# Patient Record
Sex: Male | Born: 1950
Health system: Southern US, Community
[De-identification: ages and names within clinical notes are randomized; demographics above are authoritative.]

## PROBLEM LIST (undated history)

## (undated) ENCOUNTER — Ambulatory Visit (INDEPENDENT_AMBULATORY_CARE_PROVIDER_SITE_OTHER): Admission: EM | Payer: 59

## (undated) DIAGNOSIS — Z87828 Personal history of other (healed) physical injury and trauma: Secondary | ICD-10-CM

## (undated) DIAGNOSIS — F988 Other specified behavioral and emotional disorders with onset usually occurring in childhood and adolescence: Secondary | ICD-10-CM

## (undated) DIAGNOSIS — K219 Gastro-esophageal reflux disease without esophagitis: Secondary | ICD-10-CM

## (undated) DIAGNOSIS — F419 Anxiety disorder, unspecified: Secondary | ICD-10-CM

## (undated) DIAGNOSIS — F329 Major depressive disorder, single episode, unspecified: Secondary | ICD-10-CM

## (undated) DIAGNOSIS — E785 Hyperlipidemia, unspecified: Secondary | ICD-10-CM

## (undated) DIAGNOSIS — T7840XA Allergy, unspecified, initial encounter: Secondary | ICD-10-CM

## (undated) DIAGNOSIS — F32A Depression, unspecified: Secondary | ICD-10-CM

## (undated) DIAGNOSIS — M7071 Other bursitis of hip, right hip: Secondary | ICD-10-CM

## (undated) HISTORY — PX: COLONOSCOPY: SHX174

## (undated) HISTORY — DX: Allergy, unspecified, initial encounter: T78.40XA

## (undated) HISTORY — DX: Other specified behavioral and emotional disorders with onset usually occurring in childhood and adolescence: F98.8

## (undated) HISTORY — DX: Other bursitis of hip, right hip: M70.71

## (undated) HISTORY — DX: Anxiety disorder, unspecified: F41.9

## (undated) HISTORY — DX: Gastro-esophageal reflux disease without esophagitis: K21.9

## (undated) HISTORY — DX: Personal history of other (healed) physical injury and trauma: Z87.828

## (undated) HISTORY — DX: Hyperlipidemia, unspecified: E78.5

## (undated) HISTORY — DX: Major depressive disorder, single episode, unspecified: F32.9

## (undated) HISTORY — DX: Depression, unspecified: F32.A

---

## 2002-10-06 ENCOUNTER — Encounter: Payer: Self-pay | Admitting: Orthopedic Surgery

## 2002-10-06 ENCOUNTER — Encounter: Admission: RE | Admit: 2002-10-06 | Discharge: 2002-10-06 | Payer: Self-pay | Admitting: Orthopedic Surgery

## 2003-03-11 ENCOUNTER — Encounter: Admission: RE | Admit: 2003-03-11 | Discharge: 2003-03-11 | Payer: Self-pay | Admitting: Sports Medicine

## 2004-06-22 ENCOUNTER — Ambulatory Visit: Payer: Self-pay | Admitting: Family Medicine

## 2004-08-02 ENCOUNTER — Ambulatory Visit: Payer: Self-pay | Admitting: Family Medicine

## 2004-08-17 ENCOUNTER — Ambulatory Visit: Payer: Self-pay | Admitting: Family Medicine

## 2004-10-05 ENCOUNTER — Ambulatory Visit: Payer: Self-pay | Admitting: Family Medicine

## 2004-10-19 ENCOUNTER — Ambulatory Visit: Payer: Self-pay | Admitting: Family Medicine

## 2004-10-19 ENCOUNTER — Encounter: Admission: RE | Admit: 2004-10-19 | Discharge: 2005-01-17 | Payer: Self-pay | Admitting: Family Medicine

## 2005-04-08 ENCOUNTER — Ambulatory Visit: Payer: Self-pay | Admitting: Family Medicine

## 2006-03-11 ENCOUNTER — Ambulatory Visit: Payer: Self-pay | Admitting: Family Medicine

## 2006-03-11 LAB — CONVERTED CEMR LAB: PSA: 1.05 ng/mL

## 2006-03-27 ENCOUNTER — Ambulatory Visit: Payer: Self-pay | Admitting: Family Medicine

## 2006-03-27 LAB — FECAL OCCULT BLOOD, GUAIAC: Fecal Occult Blood: NEGATIVE

## 2006-06-24 ENCOUNTER — Ambulatory Visit: Payer: Self-pay | Admitting: Family Medicine

## 2006-09-18 ENCOUNTER — Ambulatory Visit: Payer: Self-pay | Admitting: Family Medicine

## 2006-09-18 LAB — CONVERTED CEMR LAB
ALT: 30 units/L (ref 0–40)
AST: 28 units/L (ref 0–37)
Albumin: 4.1 g/dL (ref 3.5–5.2)
BUN: 12 mg/dL (ref 6–23)
CO2: 31 meq/L (ref 19–32)
Calcium: 9.1 mg/dL (ref 8.4–10.5)
Chloride: 101 meq/L (ref 96–112)
Cholesterol: 219 mg/dL (ref 0–200)
Creatinine, Ser: 0.9 mg/dL (ref 0.4–1.5)
Direct LDL: 139.6 mg/dL
GFR calc Af Amer: 111 mL/min
GFR calc non Af Amer: 92 mL/min
Glucose, Bld: 98 mg/dL (ref 70–99)
HDL: 48.5 mg/dL (ref >39.0)
Phosphorus: 3.5 mg/dL (ref 2.3–4.6)
Potassium: 4.3 meq/L (ref 3.5–5.1)
Sodium: 136 meq/L (ref 135–145)
Total CHOL/HDL Ratio: 4.5
Triglycerides: 138 mg/dL (ref 0–149)
VLDL: 28 mg/dL (ref 0–40)

## 2006-11-28 ENCOUNTER — Ambulatory Visit: Payer: Self-pay | Admitting: Psychology

## 2006-12-22 ENCOUNTER — Ambulatory Visit: Payer: Self-pay | Admitting: Psychology

## 2007-01-06 ENCOUNTER — Ambulatory Visit: Payer: Self-pay | Admitting: Psychology

## 2007-01-07 ENCOUNTER — Encounter: Payer: Self-pay | Admitting: Family Medicine

## 2007-01-07 DIAGNOSIS — E785 Hyperlipidemia, unspecified: Secondary | ICD-10-CM | POA: Insufficient documentation

## 2007-01-07 DIAGNOSIS — F329 Major depressive disorder, single episode, unspecified: Secondary | ICD-10-CM

## 2007-01-07 DIAGNOSIS — J309 Allergic rhinitis, unspecified: Secondary | ICD-10-CM | POA: Insufficient documentation

## 2007-01-07 DIAGNOSIS — F418 Other specified anxiety disorders: Secondary | ICD-10-CM | POA: Insufficient documentation

## 2007-01-07 DIAGNOSIS — F528 Other sexual dysfunction not due to a substance or known physiological condition: Secondary | ICD-10-CM | POA: Insufficient documentation

## 2007-01-08 ENCOUNTER — Encounter: Payer: Self-pay | Admitting: Family Medicine

## 2007-01-08 ENCOUNTER — Ambulatory Visit: Payer: Self-pay | Admitting: Family Medicine

## 2007-01-08 DIAGNOSIS — F988 Other specified behavioral and emotional disorders with onset usually occurring in childhood and adolescence: Secondary | ICD-10-CM | POA: Insufficient documentation

## 2007-01-08 DIAGNOSIS — F411 Generalized anxiety disorder: Secondary | ICD-10-CM | POA: Insufficient documentation

## 2007-01-27 ENCOUNTER — Ambulatory Visit: Payer: Self-pay | Admitting: Psychology

## 2007-03-10 ENCOUNTER — Ambulatory Visit: Payer: Self-pay | Admitting: Psychology

## 2007-03-16 ENCOUNTER — Ambulatory Visit: Payer: Self-pay | Admitting: Family Medicine

## 2007-03-17 LAB — CONVERTED CEMR LAB
ALT: 32 units/L (ref 0–53)
AST: 28 units/L (ref 0–37)
Albumin: 4.1 g/dL (ref 3.5–5.2)
Alkaline Phosphatase: 49 units/L (ref 39–117)
BUN: 12 mg/dL (ref 6–23)
Basophils Absolute: 0 10*3/uL (ref 0.0–0.1)
Basophils Relative: 0.1 % (ref 0.0–1.0)
Bilirubin, Direct: 0.1 mg/dL (ref 0.0–0.3)
CO2: 29 meq/L (ref 19–32)
Calcium: 9 mg/dL (ref 8.4–10.5)
Chloride: 104 meq/L (ref 96–112)
Cholesterol: 203 mg/dL (ref 0–200)
Creatinine, Ser: 1 mg/dL (ref 0.4–1.5)
Direct LDL: 141.4 mg/dL
Eosinophils Absolute: 0 10*3/uL (ref 0.0–0.6)
Eosinophils Relative: 0.7 % (ref 0.0–5.0)
GFR calc Af Amer: 99 mL/min
GFR calc non Af Amer: 82 mL/min
Glucose, Bld: 112 mg/dL — ABNORMAL HIGH (ref 70–99)
HCT: 43.4 % (ref 39.0–52.0)
HDL: 51.3 mg/dL (ref >39.0)
Hemoglobin: 14.9 g/dL (ref 13.0–17.0)
Lymphocytes Relative: 28 % (ref 12.0–46.0)
MCHC: 34.4 g/dL (ref 30.0–36.0)
MCV: 91.8 fL (ref 78.0–100.0)
Monocytes Absolute: 0.4 10*3/uL (ref 0.2–0.7)
Monocytes Relative: 10.2 % (ref 3.0–11.0)
Neutro Abs: 2.1 10*3/uL (ref 1.4–7.7)
Neutrophils Relative %: 61 % (ref 43.0–77.0)
PSA: 1.11 ng/mL (ref 0.10–4.00)
Platelets: 204 10*3/uL (ref 150–400)
Potassium: 4.2 meq/L (ref 3.5–5.1)
RBC: 4.73 M/uL (ref 4.22–5.81)
RDW: 12.7 % (ref 11.5–14.6)
Sodium: 140 meq/L (ref 135–145)
TSH: 1.85 microintl units/mL (ref 0.35–5.50)
Total Bilirubin: 1.2 mg/dL (ref 0.3–1.2)
Total CHOL/HDL Ratio: 4
Total Protein: 6.9 g/dL (ref 6.0–8.3)
Triglycerides: 97 mg/dL (ref 0–149)
VLDL: 19 mg/dL (ref 0–40)
WBC: 3.5 10*3/uL — ABNORMAL LOW (ref 4.5–10.5)

## 2007-03-24 ENCOUNTER — Ambulatory Visit: Payer: Self-pay | Admitting: Psychology

## 2007-05-20 ENCOUNTER — Ambulatory Visit: Payer: Self-pay | Admitting: Family Medicine

## 2007-05-20 ENCOUNTER — Telehealth: Payer: Self-pay | Admitting: Family Medicine

## 2007-05-21 LAB — CONVERTED CEMR LAB
ALT: 46 units/L (ref 0–53)
AST: 38 units/L — ABNORMAL HIGH (ref 0–37)
Cholesterol: 186 mg/dL (ref 0–200)
Glucose, Bld: 105 mg/dL — ABNORMAL HIGH (ref 70–99)
HDL: 46.8 mg/dL (ref >39.0)
Hgb A1c MFr Bld: 6 % (ref 4.6–6.0)
LDL Cholesterol: 123 mg/dL — ABNORMAL HIGH (ref 0–99)
Total CHOL/HDL Ratio: 4
Triglycerides: 82 mg/dL (ref 0–149)
VLDL: 16 mg/dL (ref 0–40)

## 2007-05-29 ENCOUNTER — Encounter: Payer: Self-pay | Admitting: Family Medicine

## 2007-07-10 ENCOUNTER — Telehealth (INDEPENDENT_AMBULATORY_CARE_PROVIDER_SITE_OTHER): Payer: Self-pay | Admitting: *Deleted

## 2007-08-13 ENCOUNTER — Telehealth: Payer: Self-pay | Admitting: Family Medicine

## 2007-09-15 ENCOUNTER — Ambulatory Visit: Payer: Self-pay | Admitting: Cardiology

## 2007-10-08 ENCOUNTER — Telehealth: Payer: Self-pay | Admitting: Family Medicine

## 2007-10-26 ENCOUNTER — Ambulatory Visit: Payer: Self-pay | Admitting: Cardiology

## 2007-10-26 LAB — CONVERTED CEMR LAB
ALT: 47 units/L (ref 0–53)
AST: 32 units/L (ref 0–37)
Albumin: 4.1 g/dL (ref 3.5–5.2)
Alkaline Phosphatase: 49 units/L (ref 39–117)
Bilirubin, Direct: 0.1 mg/dL (ref 0.0–0.3)
Cholesterol: 163 mg/dL (ref 0–200)
HDL: 50.3 mg/dL (ref >39.0)
LDL Cholesterol: 92 mg/dL (ref 0–99)
Total Bilirubin: 1.1 mg/dL (ref 0.3–1.2)
Total CHOL/HDL Ratio: 3.2
Total Protein: 6.9 g/dL (ref 6.0–8.3)
Triglycerides: 103 mg/dL (ref 0–149)
VLDL: 21 mg/dL (ref 0–40)

## 2007-11-16 ENCOUNTER — Telehealth: Payer: Self-pay | Admitting: Family Medicine

## 2007-12-31 ENCOUNTER — Telehealth: Payer: Self-pay | Admitting: Family Medicine

## 2008-03-14 ENCOUNTER — Telehealth (INDEPENDENT_AMBULATORY_CARE_PROVIDER_SITE_OTHER): Payer: Self-pay | Admitting: *Deleted

## 2008-04-19 ENCOUNTER — Ambulatory Visit: Payer: Self-pay | Admitting: Family Medicine

## 2008-05-13 ENCOUNTER — Telehealth: Payer: Self-pay | Admitting: Family Medicine

## 2008-06-10 ENCOUNTER — Ambulatory Visit: Payer: Self-pay | Admitting: Family Medicine

## 2008-07-12 ENCOUNTER — Telehealth: Payer: Self-pay | Admitting: Family Medicine

## 2008-07-21 ENCOUNTER — Ambulatory Visit: Payer: Self-pay | Admitting: Family Medicine

## 2008-07-21 ENCOUNTER — Telehealth: Payer: Self-pay | Admitting: Family Medicine

## 2008-08-06 ENCOUNTER — Telehealth (INDEPENDENT_AMBULATORY_CARE_PROVIDER_SITE_OTHER): Payer: Self-pay | Admitting: *Deleted

## 2008-08-19 ENCOUNTER — Ambulatory Visit: Payer: Self-pay | Admitting: Family Medicine

## 2008-10-25 ENCOUNTER — Ambulatory Visit: Payer: Self-pay | Admitting: Family Medicine

## 2008-10-27 LAB — CONVERTED CEMR LAB
ALT: 39 units/L (ref 0–53)
AST: 32 units/L (ref 0–37)
Cholesterol: 213 mg/dL — ABNORMAL HIGH (ref 0–200)
Direct LDL: 130.9 mg/dL
HDL: 46.8 mg/dL (ref >39.00)
Total CHOL/HDL Ratio: 5
Triglycerides: 143 mg/dL (ref 0.0–149.0)
VLDL: 28.6 mg/dL (ref 0.0–40.0)

## 2008-11-02 ENCOUNTER — Telehealth: Payer: Self-pay | Admitting: Family Medicine

## 2008-11-29 ENCOUNTER — Ambulatory Visit: Payer: Self-pay | Admitting: Family Medicine

## 2008-11-30 LAB — CONVERTED CEMR LAB
ALT: 42 units/L (ref 0–53)
AST: 38 units/L — ABNORMAL HIGH (ref 0–37)
Cholesterol: 186 mg/dL (ref 0–200)
HDL: 47.3 mg/dL (ref >39.00)
LDL Cholesterol: 121 mg/dL — ABNORMAL HIGH (ref 0–99)
Total CHOL/HDL Ratio: 4
Triglycerides: 87 mg/dL (ref 0.0–149.0)
VLDL: 17.4 mg/dL (ref 0.0–40.0)

## 2009-01-24 ENCOUNTER — Telehealth: Payer: Self-pay | Admitting: Family Medicine

## 2009-01-31 ENCOUNTER — Ambulatory Visit: Payer: Self-pay | Admitting: Family Medicine

## 2009-04-17 ENCOUNTER — Telehealth: Payer: Self-pay | Admitting: Family Medicine

## 2009-05-04 ENCOUNTER — Ambulatory Visit: Payer: Self-pay | Admitting: Family Medicine

## 2009-05-04 DIAGNOSIS — E7800 Pure hypercholesterolemia, unspecified: Secondary | ICD-10-CM | POA: Insufficient documentation

## 2009-05-08 ENCOUNTER — Telehealth: Payer: Self-pay | Admitting: Family Medicine

## 2009-05-08 LAB — CONVERTED CEMR LAB
ALT: 37 units/L (ref 0–53)
AST: 32 units/L (ref 0–37)
Cholesterol: 185 mg/dL (ref 0–200)
HDL: 51.9 mg/dL (ref >39.00)
LDL Cholesterol: 115 mg/dL — ABNORMAL HIGH (ref 0–99)
Total CHOL/HDL Ratio: 4
Triglycerides: 93 mg/dL (ref 0.0–149.0)
VLDL: 18.6 mg/dL (ref 0.0–40.0)

## 2009-07-18 ENCOUNTER — Telehealth: Payer: Self-pay | Admitting: Family Medicine

## 2009-09-04 ENCOUNTER — Telehealth: Payer: Self-pay | Admitting: Family Medicine

## 2009-09-20 ENCOUNTER — Ambulatory Visit: Payer: Self-pay | Admitting: Family Medicine

## 2009-09-20 DIAGNOSIS — K219 Gastro-esophageal reflux disease without esophagitis: Secondary | ICD-10-CM | POA: Insufficient documentation

## 2009-10-12 ENCOUNTER — Telehealth: Payer: Self-pay | Admitting: Family Medicine

## 2009-10-18 ENCOUNTER — Ambulatory Visit: Payer: Self-pay | Admitting: Family Medicine

## 2009-11-01 ENCOUNTER — Telehealth: Payer: Self-pay | Admitting: Family Medicine

## 2009-11-01 ENCOUNTER — Encounter: Payer: Self-pay | Admitting: Family Medicine

## 2009-11-08 ENCOUNTER — Ambulatory Visit: Payer: Self-pay | Admitting: Family Medicine

## 2009-11-08 LAB — CONVERTED CEMR LAB
ALT: 38 units/L (ref 0–53)
AST: 31 units/L (ref 0–37)
Albumin: 4.1 g/dL (ref 3.5–5.2)
Alkaline Phosphatase: 50 units/L (ref 39–117)
BUN: 11 mg/dL (ref 6–23)
Basophils Absolute: 0 10*3/uL (ref 0.0–0.1)
Basophils Relative: 0.6 % (ref 0.0–3.0)
Bilirubin, Direct: 0.1 mg/dL (ref 0.0–0.3)
CO2: 29 meq/L (ref 19–32)
Calcium: 8.8 mg/dL (ref 8.4–10.5)
Chloride: 106 meq/L (ref 96–112)
Cholesterol: 129 mg/dL (ref 0–200)
Creatinine, Ser: 0.9 mg/dL (ref 0.4–1.5)
Eosinophils Absolute: 0 10*3/uL (ref 0.0–0.7)
Eosinophils Relative: 1.4 % (ref 0.0–5.0)
GFR calc non Af Amer: 91.1 mL/min (ref >60)
Glucose, Bld: 99 mg/dL (ref 70–99)
HCT: 41.6 % (ref 39.0–52.0)
HDL: 50.4 mg/dL (ref >39.00)
Hemoglobin: 14.4 g/dL (ref 13.0–17.0)
LDL Cholesterol: 63 mg/dL (ref 0–99)
Lymphocytes Relative: 32.2 % (ref 12.0–46.0)
Lymphs Abs: 1.2 10*3/uL (ref 0.7–4.0)
MCHC: 34.7 g/dL (ref 30.0–36.0)
MCV: 90.6 fL (ref 78.0–100.0)
Monocytes Absolute: 0.3 10*3/uL (ref 0.1–1.0)
Monocytes Relative: 8.4 % (ref 3.0–12.0)
Neutro Abs: 2.1 10*3/uL (ref 1.4–7.7)
Neutrophils Relative %: 57.4 % (ref 43.0–77.0)
PSA: 1.79 ng/mL (ref 0.10–4.00)
Platelets: 185 10*3/uL (ref 150.0–400.0)
Potassium: 4 meq/L (ref 3.5–5.1)
RBC: 4.59 M/uL (ref 4.22–5.81)
RDW: 13.6 % (ref 11.5–14.6)
Sodium: 141 meq/L (ref 135–145)
TSH: 1.67 microintl units/mL (ref 0.35–5.50)
Total Bilirubin: 0.7 mg/dL (ref 0.3–1.2)
Total CHOL/HDL Ratio: 3
Total Protein: 6.5 g/dL (ref 6.0–8.3)
Triglycerides: 79 mg/dL (ref 0.0–149.0)
VLDL: 15.8 mg/dL (ref 0.0–40.0)
WBC: 3.6 10*3/uL — ABNORMAL LOW (ref 4.5–10.5)

## 2009-11-13 ENCOUNTER — Ambulatory Visit: Payer: Self-pay | Admitting: Family Medicine

## 2009-12-25 ENCOUNTER — Ambulatory Visit: Payer: Self-pay | Admitting: Family Medicine

## 2010-01-02 ENCOUNTER — Telehealth: Payer: Self-pay | Admitting: Family Medicine

## 2010-01-10 ENCOUNTER — Telehealth: Payer: Self-pay | Admitting: Family Medicine

## 2010-04-19 ENCOUNTER — Telehealth: Payer: Self-pay | Admitting: Family Medicine

## 2010-06-18 ENCOUNTER — Telehealth: Payer: Self-pay | Admitting: Family Medicine

## 2010-06-29 ENCOUNTER — Telehealth: Payer: Self-pay | Admitting: Family Medicine

## 2010-07-12 ENCOUNTER — Ambulatory Visit: Payer: Self-pay | Admitting: Family Medicine

## 2010-08-21 NOTE — Progress Notes (Signed)
Summary: pt is asking if ok to take glucosamine  Phone Note Call from Patient Call back at Work Phone [phone]   Caller: Patient Call For: Judith Part MD Summary of Call: Pt is asking if ok for him to take glucosamine chondroitin for his joints. Initial call taken by: Lowella Petties CMA,  November 01, 2009 9:09 AM  Follow-up for Phone Call        that is ok if he wants to try it  if not improved in 1-2 months - do not spend any more money on it-- will know by then if it helps this med also raises cholesterol- so will watch that  please add to med list  Follow-up by: Judith Part MD,  November 01, 2009 9:29 AM  Additional Follow-up for Phone Call Additional follow up Details #1::        Patient notified as instructed by telephone. Added to med list.Rena Isley LPN  November 01, 2009 1:05 PM

## 2010-08-21 NOTE — Progress Notes (Signed)
Summary: Refill Ritalin   Phone Note Call from Patient Call back at [phone]   Caller: Patient Call For: Dr. Milinda Antis Summary of Call: Pt needs rx for his Ritalin. Call wife when ready at [phone] Initial call taken by: Sydell Axon,  October 08, 2007 11:51 AM  Follow-up for Phone Call        printed and in my out box for pick up  Follow-up by: Judith Part MD,  October 08, 2007 12:08 PM  Additional Follow-up for Phone Call Additional follow up Details #1::        [facility] advising pt ok to pick up Additional Follow-up by: Lowella Petties,  October 08, 2007 12:33 PM      Prescriptions: RITALIN SR 20 MG  TBCR (METHYLPHENIDATE HCL) take one by mouth daily  #30 x 0   Entered and Authorized by:   Judith Part MD   Signed by:   Judith Part MD on 10/08/2007   Method used:   Print then Give to Patient   RxID:   [id number]

## 2010-08-21 NOTE — Progress Notes (Signed)
Summary: needs refill on ritalin  Phone Note Call from Patient Call back at Work Phone [phone]   Caller: mPatient Call For: Judith Part MD Summary of Call: Pt needs refill on ritalin, please call when ready. Initial call taken by: Lowella Petties,  July 12, 2008 2:37 PM  Follow-up for Phone Call        printed and in my out box for pick up  Follow-up by: Judith Part MD,  July 12, 2008 3:45 PM  Additional Follow-up for Phone Call Additional follow up Details #1::        Advised patient.  ......................................................Marland KitchenLiane Comber July 12, 2008 4:08 PM Rx left at front desk for patient to pick up. .............................................................Marland KitchenNatasha Chavers July 12, 2008 4:08 PM       Prescriptions: RITALIN SR 20 MG  TBCR (METHYLPHENIDATE HCL) take one by mouth daily  #30 x 0   Entered and Authorized by:   Judith Part MD   Signed by:   Judith Part MD on 07/12/2008   Method used:   Print then Give to Patient   RxID:   [id number]

## 2010-08-21 NOTE — Progress Notes (Signed)
Summary: omeprazole is not helping  Phone Note Call from Patient Call back at Work Phone [phone]   Caller: Patient Call For: Judith Part MD Summary of Call: Pt has been taking omeprazole but he thinks this is too weak.  He would like to change to something else.  Co- workers use protonix.  [facility].   Initial call taken by: Lowella Petties CMA, AAMA,  June 18, 2010 12:35 PM  Follow-up for Phone Call        let me know what symptoms he is having  can change to protonix -px written on EMR for call in  make sure he is having no caffiene f/u with me in 2-4 weeks -- call earlier though if worse or no imp Follow-up by: Judith Part MD,  June 18, 2010 1:26 PM  Additional Follow-up for Phone Call Additional follow up Details #1::        Pt has cut out his caffeine, his diet has been some different since before  thanksgiving.  He will work on this.  Medicine sent to pharmacy, pt will see how he does and wil call back to schedule appt. Additional Follow-up by: Lowella Petties CMA, AAMA,  June 18, 2010 4:32 PM   New Allergies: * OMEPRAZOLE New/Updated Medications: PROTONIX 40 MG TBEC (PANTOPRAZOLE SODIUM) 1 by mouth each am about 30 min before breakfast New Allergies: * OMEPRAZOLEPrescriptions: PROTONIX 40 MG TBEC (PANTOPRAZOLE SODIUM) 1 by mouth each am about 30 min before breakfast  #30 x 11   Entered by:   Lowella Petties CMA, AAMA   Authorized by:   Judith Part MD   Signed by:   Lowella Petties CMA, AAMA on 06/18/2010   Method used:   Electronically to        [facility]  [phone]* (retail)       [address] [place], [place]  [address]       Ph: [phone] or [phone]       Fax: [phone]   RxID:   [id number]

## 2010-08-21 NOTE — Progress Notes (Signed)
Summary: [facility]  Prescriptions: CRESTOR 40 MG TABS (ROSUVASTATIN CALCIUM) one by mouth daily  #90 x 3   Entered by:   Lowella Petties CMA   Authorized by:   Judith Part MD   Signed by:   Lowella Petties CMA on 09/04/2009   Method used:   Electronically to        [facility]  [phone]* (retail)       [address] [place], [place]  [address]       Ph: [phone] or [phone]       Fax: [phone]   RxID:   [id number]

## 2010-08-21 NOTE — Progress Notes (Signed)
Summary: refill request for ritalin  Phone Note Refill Request Call back at Work Phone [phone] Message from:  Patient  Refills Requested: Medication #1:  RITALIN SR 20 MG  TBCR take one by mouth daily Please call when ready.  Initial call taken by: Lowella Petties CMA,  April 17, 2009 8:32 AM  Follow-up for Phone Call        printed in put in nurse in box for pickup  Follow-up by: Judith Part MD,  April 17, 2009 8:47 AM  Additional Follow-up for Phone Call Additional follow up Details #1::        Advised patient.  ......................................................Marland KitchenNatasha Chavers CMA Duncan Dull) April 17, 2009 9:26 AM Rx left at front desk for patient to pick up. .............................................................Marland KitchenNatasha Chavers CMA (AAMA) April 17, 2009 9:26 AM     Prescriptions: RITALIN SR 20 MG  TBCR (METHYLPHENIDATE HCL) take one by mouth daily  #30 x 0   Entered and Authorized by:   Judith Part MD   Signed by:   Judith Part MD on 04/17/2009   Method used:   Print then Give to Patient   RxID:   [id number]

## 2010-08-21 NOTE — Progress Notes (Signed)
Summary: Pt needs rx for his Ritalin  Phone Note Call from Patient Call back at Work Phone [phone]   Caller: Patient Summary of Call: Pt needs rx for his Ritalin. Please call when ready at work number. Initial call taken by: Mickle Asper,  November 16, 2007 3:07 PM  Follow-up for Phone Call        printed and in my out box for pick up  Follow-up by: Judith Part MD,  November 16, 2007 3:17 PM  Additional Follow-up for Phone Call Additional follow up Details #1::        [facility] advising pt Additional Follow-up by: Lowella Petties,  November 16, 2007 3:24 PM      Prescriptions: RITALIN SR 20 MG  TBCR (METHYLPHENIDATE HCL) take one by mouth daily  #30 x 0   Entered and Authorized by:   Judith Part MD   Signed by:   Judith Part MD on 11/16/2007   Method used:   Print then Give to Patient   RxID:   [id number]

## 2010-08-21 NOTE — Progress Notes (Signed)
Summary: needs refill on ritalin  Phone Note Call from Patient Call back at Work Phone [phone]   Caller: Patient Call For: dr Judye Lorino Summary of Call: Pt need refill on ritalin, needs next week.  also wants referral to derm, Dr Londell Moh, for dry patch on ear. Initial call taken by: Lowella Petties,  May 13, 2008 4:07 PM  Follow-up for Phone Call        printed and in my out box for pick up will [facility] ref to [facility] Follow-up by: Judith Part MD,  May 13, 2008 5:00 PM  Additional Follow-up for Phone Call Additional follow up Details #1::        Appt made w/Dermatologist. Message left on Pt's machine w/detailed instructions about appt. Additional Follow-up by: Mickle Asper,  May 16, 2008 5:01 PM  New Problems: OTH SYMPTOMS INVOLVING SKIN&INTEG TISSUES (ICD-782.9)   New Problems: OTH SYMPTOMS INVOLVING SKIN&INTEG TISSUES (ICD-782.9)   Prescriptions: RITALIN SR 20 MG  TBCR (METHYLPHENIDATE HCL) take one by mouth daily  #30 x 0   Entered and Authorized by:   Judith Part MD   Signed by:   Judith Part MD on 05/13/2008   Method used:   Print then Give to Patient   RxID:   [id number]

## 2010-08-21 NOTE — Progress Notes (Signed)
Summary: refill request for paxil  Phone Note Refill Request Call back at Home Phone [phone] Message from:  Patient  Refills Requested: Medication #1:  paxil Refill request from pt, this is no longer on med list.  [facility] to [facility].  Initial call taken by: Lowella Petties CMA,  May 08, 2009 8:51 AM  Follow-up for Phone Call        please ask him if he wants to go back to plain paxil 20 or paxil cr 12.5 -- if he remembers (both on med list prev) Follow-up by: Judith Part MD,  May 08, 2009 9:12 AM  Additional Follow-up for Phone Call Additional follow up Details #1::        Left message for patient to call back. Lewanda Rife LPN  May 08, 2009 5:23 PM   Pt does not remember which paxil he wants to go back on. He apologized and said he would ck with [facility] tomorrow.  ok- please let me know then  Additional Follow-up by: Lewanda Rife LPN,  May 09, 2009 4:24 PM    Additional Follow-up for Phone Call Additional follow up Details #2::    Spoke to pharmacist, Onalee Hua at [facility] and last med prescribed was in 2009 and was Paxil CR 12.5mg . Thank you.Lewanda Rife LPN  May 09, 2009 5:04 PM   will go with that then--px written on EMR for call in  Follow-up by: Judith Part MD,  May 09, 2009 5:06 PM  Additional Follow-up for Phone Call Additional follow up Details #3:: Details for Additional Follow-up Action Taken: Medication phoned in to [facility] as instructed. Pt will ck with pharmacy tomorrow and pick med up. Additional Follow-up by: Lewanda Rife LPN,  May 09, 2009 5:19 PM  New/Updated Medications: PAXIL CR 12.5 MG XR24H-TAB (PAROXETINE HCL) 1 by mouth once daily Prescriptions: PAXIL CR 12.5 MG XR24H-TAB (PAROXETINE HCL) 1 by mouth once daily  #30 x 5   Entered and Authorized by:   Judith Part MD   Signed by:   Judith Part MD on 05/09/2009   Method used:   Telephoned to ...         RxID:    [id number]

## 2010-08-21 NOTE — Assessment & Plan Note (Signed)
Summary: CPX/CLE  Medications Added PAXIL 20 MG  TABS (PAROXETINE HCL)  * VITAMIN D  * ASA 81 MG QD         Vital Signs:  Patient Profile:   60 Years Old Male Height:     66.75 inches Weight:      177 pounds Temp:     98.4 degrees F oral Pulse rate:   64 / minute Pulse rhythm:   regular BP sitting:   120 / 84  (left arm) Cuff size:   regular  Vitals Entered By: Lowella Petties (March 16, 2007 9:15 AM)               Chief Complaint:  Check up.  History of Present Illness: has been doing ok in general paxil made him a little less anxious-a little drowsy at first has helped him sleep in general- wakes up occasionally is seeing Dr Dellia Cloud- and doing ok will think about colonoscopy no prostate problems- nocturia occas-drinks lots of diet coke  has been ok with hip bursitis and meniscal prob in L knee has coffee at least 4 cups daily- makes him feel better ADD has been stable- ritalin really helps with mood (frustration) also  walks regularly for exercise- forgets to wear sunblock is better when playing golf is interested in derm visit for skin ca screen and to re check scab on nose   Current Allergies: ! ZOLOFT   Family History:    Father: CAD- deceased age 32    Mother: HTN, depression    Siblings:     P uncle with CAD    MGF with COPD    Review of Systems      See HPI  General      Denies chills, fatigue, fever, and loss of appetite.  Eyes      Denies blurring and eye irritation.  CV      Denies chest pain or discomfort, palpitations, shortness of breath with exertion, and swelling of feet.  Resp      Denies shortness of breath.  GI      Denies bloody stools, change in bowel habits, loss of appetite, nausea, and vomiting.  GU      Denies nocturia, urinary frequency, and urinary hesitancy.  Derm      Denies changes in color of skin, lesion(s), and rash.  Neuro      Denies numbness and tremors.  Psych      mood is some  improved  Endo      Denies excessive thirst and excessive urination.   Physical Exam  General:     Well-developed,well-nourished,in no acute distress; alert,appropriate and cooperative throughout examination Head:     Normocephalic and atraumatic without obvious abnormalities. No apparent alopecia or balding. Eyes:     vision grossly intact, pupils equal, pupils round, and pupils reactive to light.   Ears:     R ear normal and L ear normal.   Nose:     no nasal discharge.   Mouth:     pharynx pink and moist.   Neck:     No deformities, masses, or tenderness noted.supple, no masses, no thyromegaly, no JVD, and no carotid bruits.   Chest Wall:     No deformities, masses, tenderness or gynecomastia noted. Lungs:     Normal respiratory effort, chest expands symmetrically. Lungs are clear to auscultation, no crackles or wheezes. Heart:     Normal rate and regular rhythm. S1 and S2 normal without gallop,  murmur, click, rub or other extra sounds. Abdomen:     Bowel sounds positive,abdomen soft and non-tender without masses, organomegaly or hernias noted. Rectal:     No external abnormalities noted. Normal sphincter tone. No rectal masses or tenderness.  heme neg stool Prostate:     Prostate gland firm and smooth, no enlargement, nodularity, tenderness, mass, asymmetry or induration. (20-30 g) Msk:     No deformity or scoliosis noted of thoracic or lumbar spine.   Pulses:     R and L carotid,radial,femoral,dorsalis pedis and posterior tibial pulses are full and equal bilaterally Extremities:     No clubbing, cyanosis, edema, or deformity noted with normal full range of motion of all joints.   Neurologic:     sensation intact to light touch, gait normal, and DTRs symmetrical and normal.  no tremor Skin:     turgor normal, color normal, and no rashes.   AK noted on nasal bridge Cervical Nodes:     No lymphadenopathy noted Inguinal Nodes:     No significant adenopathy Psych:      pleasant, trouble with rapid speech today, as he did not take his ADD med    Impression & Recommendations:  Problem # 1:  HEALTH MAINTENANCE EXAM (ICD-V70.0) disc health habits like diet and exercise and skin ca prev reviewed health me list, given stool cards for colon ca screen (pt will call if he wants colonosc) fam hx reviewed Orders: Venipuncture (88416) TLB-BMP (Basic Metabolic Panel-BMET) (80048-METABOL) TLB-CBC Platelet - w/Differential (85025-CBCD) TLB-Hepatic/Liver Function Pnl (80076-HEPATIC) TLB-TSH (Thyroid Stimulating Hormone) (84443-TSH) TLB-PSA (Prostate Specific Antigen) (84153-PSA)   Problem # 2:  ACTINIC KERATOSIS (ICD-702.0) on bridge of nose- will f/u with his derm Orders: Venipuncture (60630) Dermatology Referral (Derma)   Problem # 3:  HYPERLIPIDEMIA (ICD-272.4) will check labs today, is eating better (cut out muffins) His updated medication list for this problem includes:    Lipitor 10 Mg Tabs (Atorvastatin calcium) .Marland Kitchen... Take one by mouth daily  Orders: Venipuncture (16010) TLB-Lipid Panel (80061-LIPID)   Complete Medication List: 1)  Lipitor 10 Mg Tabs (Atorvastatin calcium) .... Take one by mouth daily 2)  Ritalin Sr 20 Mg Tbcr (Methylphenidate hcl) .... Take one by mouth daily 3)  Paxil 20 Mg Tabs (Paroxetine hcl) 4)  Vitamin D  5)  Asa 81 Mg Qd    Patient Instructions: 1)  the name of skin lotion over the counter for dry skin is called amylactin 2)  we will refer you to dermatology at check out 3)  keep watching diet for cholesterol    Prescriptions: LIPITOR 10 MG  TABS (ATORVASTATIN CALCIUM) take one by mouth daily  #30 x 11   Entered and Authorized by:   Judith Part MD   Signed by:   Judith Part MD on 03/16/2007   Method used:   Print then Give to Patient   RxID:   [id number] RITALIN SR 20 MG  TBCR (METHYLPHENIDATE HCL) take one by mouth daily  #30 x 0   Entered and Authorized by:   Judith Part MD   Signed by:    Judith Part MD on 03/16/2007   Method used:   Print then Give to Patient   RxID:   [id number]   Prior Medications: LIPITOR 10 MG  TABS (ATORVASTATIN CALCIUM) take one by mouth daily RITALIN SR 20 MG  TBCR (METHYLPHENIDATE HCL) take one by mouth daily Current Allergies: ! ZOLOFT   Preventive Care Screening  DRE 8/07

## 2010-08-21 NOTE — Assessment & Plan Note (Signed)
Summary: FLU SHOT/CLE   Nurse Visit    Prior Medications: LIPITOR 10 MG  TABS (ATORVASTATIN CALCIUM) take one by mouth daily RITALIN SR 20 MG  TBCR (METHYLPHENIDATE HCL) take one by mouth daily PAXIL CR 12.5 MG  TB24 (PAROXETINE HCL) 1 by mouth q evening ALPRAZOLAM 0.5 MG  TABS (ALPRAZOLAM) 1 by mouth two times a day as needed for severe anxiety RITALIN SR 20 MG  TBCR (METHYLPHENIDATE HCL) 1 by mouth qd PAXIL 20 MG  TABS (PAROXETINE HCL)  VITAMIN D ()  ASA 81 MG QD ()  Current Allergies: ! ZOLOFT    Orders Added: 1)  Flu Vaccine 40yrs + [90658] 2)  Admin 1st Vaccine Mishka.Peer    ]  Influenza Vaccine    Vaccine Type: Fluvax 3+    Site: left deltoid    Mfr: Sanofi Pasteur    Dose: 0.5 ml    Route: IM    Given by: Providence Crosby    Exp. Date: 01/19/2008    Lot #: [id number]    VIS given: 01/18/05 version given May 29, 2007.  Flu Vaccine Consent Questions    Do you have a history of severe allergic reactions to this vaccine? no    Any prior history of allergic reactions to egg and/or gelatin? no    Do you have a sensitivity to the preservative Thimersol? no    Do you have a past history of Guillan-Barre Syndrome? no    Do you currently have an acute febrile illness? no    Have you ever had a severe reaction to latex? no    Vaccine information given and explained to patient? yes

## 2010-08-21 NOTE — Assessment & Plan Note (Signed)
Summary: OV     Current Allergies: ! ZOLOFT

## 2010-08-21 NOTE — Progress Notes (Signed)
Summary: RITALIN RX please.  Phone Note Call from Patient Call back at Work Phone [phone]   Caller: Patient Summary of Call: Pt called for RX for his RITALIN. Please calll him when it is ready for pick up. Initial call taken by: Mickle Asper,  December 31, 2007 11:46 AM  Follow-up for Phone Call        px written on EMR for call in  Follow-up by: Judith Part MD,  December 31, 2007 1:46 PM  Additional Follow-up for Phone Call Additional follow up Details #1::        advised pt ok to pick up Additional Follow-up by: Lowella Petties,  December 31, 2007 2:08 PM      Prescriptions: RITALIN SR 20 MG  TBCR (METHYLPHENIDATE HCL) take one by mouth daily  #30 x 0   Entered and Authorized by:   Judith Part MD   Signed by:   Judith Part MD on 12/31/2007   Method used:   Print then Give to Patient   RxID:   [id number]

## 2010-08-21 NOTE — Progress Notes (Signed)
Summary: ritalin rx   Phone Note Refill Request Call back at Work Phone [phone] Message from:  Patient on August 13, 2007 12:32 PM  Refills Requested: Medication #1:  RITALIN SR 20 MG  TBCR take one by mouth daily Initial call taken by: Liane Comber,  August 13, 2007 12:32 PM  Follow-up for Phone Call        printed and in my out box for pick up  Follow-up by: Judith Part MD,  August 13, 2007 12:45 PM  Additional Follow-up for Phone Call Additional follow up Details #1::        [facility] advising pt ok to pick up Additional Follow-up by: Lowella Petties,  August 13, 2007 3:02 PM      Prescriptions: RITALIN SR 20 MG  TBCR (METHYLPHENIDATE HCL) take one by mouth daily  #30 x 0   Entered and Authorized by:   Judith Part MD   Signed by:   Judith Part MD on 08/13/2007   Method used:   Print then Give to Patient   RxID:   [id number]

## 2010-08-21 NOTE — Progress Notes (Signed)
  Phone Note Outgoing Call   Call placed by: Mills Koller,  January 02, 2010 2:14 PM Call placed to: Patient Summary of Call: Stool ifob was canceled due to "no sample returned". I have made several attemps to contact pt, LMOM .  If he does return the call I will ask him to follow through on ifob testing.   Initial call taken by: Mills Koller,  January 02, 2010 2:24 PM

## 2010-08-21 NOTE — Miscellaneous (Signed)
Summary: Glucosamine Chondritin update  Medications Added GLUCOSAMINE-CHONDROITIN 500-400 MG CAPS (GLUCOSAMINE-CHONDROITIN) OTC As directed.       Clinical Lists Changes  Medications: Added new medication of GLUCOSAMINE-CHONDROITIN 500-400 MG CAPS (GLUCOSAMINE-CHONDROITIN) OTC As directed.

## 2010-08-21 NOTE — Progress Notes (Signed)
Summary: needs refill on ritalin  Phone Note Refill Request Call back at Work Phone [phone] Message from:  Patient  Refills Requested: Medication #1:  RITALIN SR 20 MG  TBCR take one by mouth daily Phoned request from pt, please call when ready.  Initial call taken by: Lowella Petties,  November 02, 2008 3:04 PM  Follow-up for Phone Call        printed and in my out box for pick up  Follow-up by: Judith Part MD,  November 02, 2008 4:51 PM  Additional Follow-up for Phone Call Additional follow up Details #1::        Advised patient.  ......................................................Marland KitchenNatasha Chavers November 03, 2008 9:08 AM Rx left at front desk for patient to pick up. .............................................................Marland KitchenNatasha Chavers November 03, 2008 9:08 AM       Prescriptions: RITALIN SR 20 MG  TBCR (METHYLPHENIDATE HCL) take one by mouth daily  #30 x 0   Entered and Authorized by:   Judith Part MD   Signed by:   Judith Part MD on 11/02/2008   Method used:   Print then Give to Patient   RxID:   [id number]

## 2010-08-21 NOTE — Assessment & Plan Note (Signed)
Summary: COUGH X 2 MTHS/CLE   Vital Signs:  Patient Profile:   60 Years Old Male Height:     66.75 inches Weight:      181 pounds BMI:     28.66 Temp:     97.5 degrees F oral Pulse rate:   60 / minute Pulse rhythm:   regular BP standing:   120 / 86  (right arm) Cuff size:   regular  Vitals Entered By: Liane Comber (August 19, 2008 8:51 AM)                 Chief Complaint:  Cough.  History of Present Illness: thinks he is doing better with cough and congestion was seen in nov and december for ? bronchitis / uri off and on - better and then worse  using tessalon 100 mg - works well for him had used tussionex   cough is slowly slowing down   no heartburn or stomach probs at all never had a fever  nose and throat and ears are fine  had coughed up dark yellow sputum last weekend-- and now non productive (and overall getting better )  needs refil of viagra   had ear lesion taken off - Dr Londell Moh - is doing well with that  does wear sun protection  needs refil on crestor chol has done very well on it has had cardiology f/u no chest pain or sob does watch sat fats in diet most or the time     Current Allergies: ! ZOLOFT  Past Medical History:    Reviewed history from 01/07/2007 and no changes required:       Allergic rhinitis       Depression/anx       ADD       Hyperlipidemia       AKs                     derm- [place]  Past Surgical History:    Reviewed history from 01/07/2007 and no changes required:       Left knee meniscal tear       C-7 old fracture   Family History:    Reviewed history from 03/16/2007 and no changes required:       Father: CAD- deceased age 43       Mother: HTN, depression       Siblings:        P uncle with CAD       MGF with COPD  Social History:    Reviewed history from 06/10/2008 and no changes required:       Marital Status: Married       Children: 1       Occupation: Psychologist, occupational       non smoker    Review of  Systems  General      Denies chills, fatigue, fever, loss of appetite, malaise, and weight loss.  Eyes      Denies blurring and itching.  ENT      Denies earache, hoarseness, and nasal congestion.  CV      Denies chest pain or discomfort, palpitations, and swelling of feet.  Resp      Complains of cough.      Denies pleuritic, shortness of breath, sputum productive, and wheezing.  GI      Denies abdominal pain, indigestion, loss of appetite, nausea, and vomiting.  Derm      Complains of lesion(s).  Psych  mood ok    Physical Exam  General:     Well-developed,well-nourished,in no acute distress; alert,appropriate and cooperative throughout examination Head:     normocephalic, atraumatic, and no abnormalities observed.  no sinus tenderness  Eyes:     vision grossly intact, pupils equal, pupils round, and pupils reactive to light.  no conjunctival pallor, injection or icterus  Ears:     R ear normal and L ear normal.   Nose:     nares are boggy but clear Mouth:     pharyngeal erythema, no exudate Neck:     No deformities, masses, or tenderness noted. Chest Wall:     No deformities, masses, tenderness or gynecomastia noted. Lungs:     Normal respiratory effort, chest expands symmetrically. Lungs are clear to auscultation, no crackles or wheezes. Heart:     Normal rate and regular rhythm. S1 and S2 normal without gallop, murmur, click, rub or other extra sounds. Abdomen:     Bowel sounds positive,abdomen soft and non-tender without masses, organomegaly or hernias noted. no renal bruits  Msk:     No deformity or scoliosis noted of thoracic or lumbar spine.   Pulses:     R and L carotid,radial,femoral,dorsalis pedis and posterior tibial pulses are full and equal bilaterally Extremities:     No clubbing, cyanosis, edema, or deformity noted with normal full range of motion of all joints.   Skin:     several AKS - face/forehead healing lesion on ear Cervical  Nodes:     No lymphadenopathy noted Psych:     as usual- slt hyperactive demeanor- but pleasant and talkative     Impression & Recommendations:  Problem # 1:  COUGH (ICD-786.2) Assessment: Improved s/p 2 separate uris- slowly resolving , much imp with tessalon will continue to watch for prod cough/sob/wheeze/fever- update also tx 1 week for gerd- with low dose zantac otc call if not sig imp next week   Problem # 2:  ADD (ICD-314.00) Assessment: Unchanged doing well- refil med today  Problem # 3:  ERECTILE DYSFUNCTION (ICD-302.72) Assessment: Unchanged refil viagra today clinically stable  His updated medication list for this problem includes:    Viagra 50 Mg Tabs (Sildenafil citrate) .Marland Kitchen... 1 by mouth as directed as needed before sexual activity   Problem # 4:  HYPERLIPIDEMIA (ICD-272.4) Assessment: Unchanged doing very well on crestor and diet refil med  rev sat fats in diet labs planned for april His updated medication list for this problem includes:    Crestor 40 Mg Tabs (Rosuvastatin calcium) ..... One by mouth daily  Labs Reviewed: Chol: 163 (10/26/2007)   HDL: 50.3 (10/26/2007)   LDL: 92 (10/26/2007)   TG: 103 (10/26/2007) SGOT: 32 (10/26/2007)   SGPT: 47 (10/26/2007)   Complete Medication List: 1)  Crestor 40 Mg Tabs (Rosuvastatin calcium) .... One by mouth daily 2)  Ritalin Sr 20 Mg Tbcr (Methylphenidate hcl) .... Take one by mouth daily 3)  Benzonatate 100 Mg Caps (Benzonatate) .Marland Kitchen.. 1-2 caps every 8 hours as needed cough.  swallow whole, do not chew or dissolve in mouth 4)  Viagra 50 Mg Tabs (Sildenafil citrate) .Marland Kitchen.. 1 by mouth as directed as needed before sexual activity   Patient Instructions: 1)  continue the tessalon for cough as needed  2)  also take zantac over the counter 75 two times a day for one week to help any reflux that may be triggering cough  3)  update me if not improved further next week  4)  schedule labs fasting for lipid/ast/alt 272 in  april    Prescriptions: CRESTOR 40 MG TABS (ROSUVASTATIN CALCIUM) one by mouth daily  #90 x 3   Entered and Authorized by:   Judith Part MD   Signed by:   Judith Part MD on 08/19/2008   Method used:   Print then Give to Patient   RxID:   [id number] VIAGRA 50 MG TABS (SILDENAFIL CITRATE) 1 by mouth as directed as needed before sexual activity  #9 x 1   Entered and Authorized by:   Judith Part MD   Signed by:   Judith Part MD on 08/19/2008   Method used:   Print then Give to Patient   RxID:   [id number] RITALIN SR 20 MG  TBCR (METHYLPHENIDATE HCL) take one by mouth daily  #30 x 0   Entered and Authorized by:   Judith Part MD   Signed by:   Judith Part MD on 08/19/2008   Method used:   Print then Give to Patient   RxID:   [id number] BENZONATATE 100 MG CAPS (BENZONATATE) 1-2 caps every 8 hours as needed cough.  swallow whole, do not chew or dissolve in mouth  #30 x 0   Entered and Authorized by:   Judith Part MD   Signed by:   Judith Part MD on 08/19/2008   Method used:   Print then Give to Patient   RxID:   [id number]

## 2010-08-21 NOTE — Assessment & Plan Note (Signed)
Summary: COLD/RBH   Vital Signs:  Patient Profile:   60 Years Old Male Height:     66.75 inches Weight:      182 pounds BMI:     28.82 Temp:     97.9 degrees F oral Pulse rate:   68 / minute Pulse rhythm:   regular BP sitting:   118 / 80  (left arm) Cuff size:   regular  Vitals Entered By: Liane Comber (June 10, 2008 12:55 PM)                 Chief Complaint:  Cold & URI symptoms.  History of Present Illness: thinks he has uri  miserable all week with cough and nasal congestion got run down - with work throat- lots of congestion  monday- scratchy and then really sore on tuesday clammy and sometimes hot - but no fever  no facial pain  is clearing throat and hoarse (was worse 2 days ago) some color in phlegm  was coughing bad before tussin   no n/v/d    has taken some tussin over the counter  did take some zyrtec     Current Allergies (reviewed today): ! ZOLOFT  Past Medical History:    Reviewed history from 01/07/2007 and no changes required:       Allergic rhinitis       Depression       Hyperlipidemia  Past Surgical History:    Reviewed history from 01/07/2007 and no changes required:       Left knee meniscal tear       C-7 old fracture   Family History:    Reviewed history from 03/16/2007 and no changes required:       Father: CAD- deceased age 60       Mother: HTN, depression       Siblings:        P uncle with CAD       MGF with COPD  Social History:    Reviewed history from 01/07/2007 and no changes required:       Marital Status: Married       Children: 1       Occupation: Psychologist, occupational       non smoker    Review of Systems  General      Complains of fatigue.      Denies fever, loss of appetite, and malaise.  Eyes      Denies blurring, discharge, eye pain, and red eye.  ENT      Complains of nasal congestion and postnasal drainage.      Denies ear discharge, earache, and sinus pressure.  CV      Denies chest pain or  discomfort and palpitations.  Resp      Complains of cough and sputum productive.      Denies shortness of breath and wheezing.  GI      Denies diarrhea, nausea, and vomiting.  Derm      Denies rash.   Physical Exam  General:     Well-developed,well-nourished,in no acute distress; alert,appropriate and cooperative throughout examination Head:     normocephalic, atraumatic, and no abnormalities observed.  no sinus tenderness  Eyes:     vision grossly intact, pupils equal, pupils round, and pupils reactive to light.  no conjunctival pallor, injection or icterus  Ears:     R ear normal and L ear normal.   Nose:     nares are boggy and injected  Mouth:  pharynx pink and moist, no erythema, and no exudates.   Neck:     No deformities, masses, or tenderness noted. Lungs:     harsh bs at bases  no rales or rhonchi  scant wheeze on forced exp only   Heart:     Normal rate and regular rhythm. S1 and S2 normal without gallop, murmur, click, rub or other extra sounds. Skin:     Intact without suspicious lesions or rashes Cervical Nodes:     No lymphadenopathy noted Psych:     normal affect, talkative and pleasant     Impression & Recommendations:  Problem # 1:  URI (ICD-465.9) Assessment: New viral uri with cough and congestion  adv symt care / fluids /rest  will try otc tussin for cough-- if more severe cough- give tussionex to use with caution update if worse or fever/ not imp 1 week His updated medication list for this problem includes:    Tussionex Pennkinetic Er 8-10 Mg/11ml Lqcr (Chlorpheniramine-hydrocodone) .Marland Kitchen... 1/2 to 1 tsp up to two times a day as needed severe cough   Complete Medication List: 1)  Crestor 40 Mg Tabs (Rosuvastatin calcium) .... One by mouth daily 2)  Ritalin Sr 20 Mg Tbcr (Methylphenidate hcl) .... Take one by mouth daily 3)  Tussionex Pennkinetic Er 8-10 Mg/33ml Lqcr (Chlorpheniramine-hydrocodone) .... 1/2 to 1 tsp up to two times a day  as needed severe cough   Patient Instructions: 1)  drink lots of fluids and get extra rest  2)  try mucinex DM or continue tussin for cough  3)  if cough is severe- try the px tussionex with caution because it may sedate  4)  if you cough gets more productive or if you run a fever - update me  5)  update me if not improved in a week    Prescriptions: TUSSIONEX PENNKINETIC ER 8-10 MG/5ML LQCR (CHLORPHENIRAMINE-HYDROCODONE) 1/2 to 1 tsp up to two times a day as needed severe cough  #8 oz x 0   Entered and Authorized by:   Judith Part MD   Signed by:   Judith Part MD on 06/10/2008   Method used:   Print then Give to Patient   RxID:   [id number]  ]

## 2010-08-21 NOTE — Progress Notes (Signed)
Summary: refill request for ritalin  Phone Note Refill Request Call back at Work Phone [phone] Message from:  Patient  Refills Requested: Medication #1:  RITALIN SR 20 MG  TBCR take one by mouth daily Please call pt when ready.  Initial call taken by: Lowella Petties,  January 24, 2009 9:11 AM  Follow-up for Phone Call        printed and in my out box for pick up  Follow-up by: Judith Part MD,  January 24, 2009 9:41 AM  Additional Follow-up for Phone Call Additional follow up Details #1::        Advised patient.  ......................................................Marland KitchenLiane Comber CMA January 24, 2009 3:08 PM Rx left at front desk for patient to pick up. .............................................................Marland KitchenLiane Comber CMA January 24, 2009 3:08 PM       Prescriptions: RITALIN SR 20 MG  TBCR (METHYLPHENIDATE HCL) take one by mouth daily  #30 x 0   Entered and Authorized by:   Judith Part MD   Signed by:   Judith Part MD on 01/24/2009   Method used:   Print then Give to Patient   RxID:   [id number]

## 2010-08-21 NOTE — Progress Notes (Signed)
Summary: refill request for ritalin  Phone Note Refill Request Call back at [phone], wife's cell Kriste Basque) Message from:  Patient  Refills Requested: Medication #1:  RITALIN SR 20 MG  TBCR take one by mouth daily Please call pt's wife when ready.  Initial call taken by: Lowella Petties CMA,  January 10, 2010 9:53 AM  Follow-up for Phone Call        [facility] advising wife she can pick up. Follow-up by: Lowella Petties CMA,  January 10, 2010 2:19 PM    Prescriptions: RITALIN SR 20 MG  TBCR (METHYLPHENIDATE HCL) take one by mouth daily  #30 x 0   Entered and Authorized by:   Shaune Leeks MD   Signed by:   Shaune Leeks MD on 01/10/2010   Method used:   Print then Give to Patient   RxID:   [id number]

## 2010-08-21 NOTE — Progress Notes (Signed)
Summary: ON-CALL PHONE NOTE: COUGH MED CONCERNS  Phone Note Call from Patient Call back at [phone]   Caller: Patient Summary of Call: PATIENT WITH ONGONING COUGH: PATIENT HAS COUGH MEDICATION AND IT HAS 1 REFILL ON IT BUT THE PHARMACY SAID ITS TOO SOON TO REFILL. PATIENT SAID HE WOULD LIKE TO KNOW IF WE CAN OK FOR HIM TO HAVE MED FILLED. PLEASE AD[facility]ISE [facility] Initial call taken by: Shonna Chock,  August 06, 2008 10:27 AM  Follow-up for Phone Call        Please call and explain to patient: Cannot fill that medication early because it is a very strong, narcotic based cough medication.  However, he can have a trial of tessalon perles.  that has been sent to his pharmacy.  Works well to numb the cough center with severe coughs. Or can come in and be seen.  Otherwise,  patient to follow up with PCP on Monday.  Called patient, but no answer.  LMOM that a prescription was sent in and call if any concerns Follow-up by: Paulo Fruit MD,  August 06, 2008 10:43 AM    New/Updated Medications: BENZONATATE 100 MG CAPS (BENZONATATE) 1-2 caps every 8 hours as needed cough.  swallow whole, do not chew or dissolve in mouth   Prescriptions: BENZONATATE 100 MG CAPS (BENZONATATE) 1-2 caps every 8 hours as needed cough.  swallow whole, do not chew or dissolve in mouth  #30 x 1   Entered and Authorized by:   Paulo Fruit MD   Signed by:   Paulo Fruit MD on 08/06/2008   Method used:   Electronically to        [phone] (retail)       [address] [place], [place]  [address]       Ph: [phone] or [phone]       Fax: [phone]   RxID:   [id number]

## 2010-08-21 NOTE — Progress Notes (Signed)
Summary: ritalin  Phone Note Refill Request Call back at Work Phone [phone] Message from:  Patient on April 19, 2010 1:03 PM  Refills Requested: Medication #1:  RITALIN SR 20 MG  TBCR take one by mouth daily  Method Requested: Pick up at Office Initial call taken by: Melody Comas,  April 19, 2010 1:03 PM  Follow-up for Phone Call        printed in put in nurse in box for pickup  Follow-up by: Judith Part MD,  April 20, 2010 8:00 AM  Additional Follow-up for Phone Call Additional follow up Details #1::        Prescription left at front desk. Patient notified as instructed by telephone. Lewanda Rife LPN  April 20, 2010 10:54 AM     New/Updated Medications: RITALIN SR 20 MG  TBCR (METHYLPHENIDATE HCL) take one by mouth daily Prescriptions: RITALIN SR 20 MG  TBCR (METHYLPHENIDATE HCL) take one by mouth daily  #30 x 0   Entered and Authorized by:   Judith Part MD   Signed by:   Judith Part MD on 04/20/2010   Method used:   Print then Give to Patient   RxID:   [id number]

## 2010-08-21 NOTE — Progress Notes (Signed)
Summary: requests refill on tussionex  Phone Note Call from Patient Call back at Work Phone [phone]   Caller: Patient Call For: Judith Part MD Summary of Call: Pt states he again has cough and cold sxs. He is requesting that a refill of tussionex be called to [facility]. Initial call taken by: Lowella Petties,  July 21, 2008 8:37 AM  Follow-up for Phone Call        cannot refil this without a visit due to narcotic nature (is for severe cough) I recommend fluids/ rest/ and trial of mucinex DM for cough f/u if worse or not imp next week Follow-up by: Judith Part MD,  July 21, 2008 8:50 AM  Additional Follow-up for Phone Call Additional follow up Details #1::        pt advised, he said he has tried otc meds but it did not help he scheduled appt today with Dr Ermalene Searing. Additional Follow-up by: Liane Comber,  July 21, 2008 9:44 AM

## 2010-08-21 NOTE — Progress Notes (Signed)
Summary: PRILOSEC 20 MG CPDR  Phone Note Refill Request Call back at Work Phone [phone] Message from:  Patient on October 12, 2009 10:31 AM  Refills Requested: Medication #1:  PRILOSEC 20 MG CPDR 1 by mouth once daily in am before breakfast. [facility]. Patient wants to know what you think about him increasing it to 2 a day. He says that he feels a lot better, he just starts having problems mid day. He also wants to know if iced tea, or hot tea would cause any problems. Please advise.   Initial call taken by: Melody Comas,  October 12, 2009 10:32 AM  Follow-up for Phone Call        go ahead and inc to two times a day  avoid tea and caffiene in general  Follow-up by: Judith Part MD,  October 12, 2009 10:51 AM  Additional Follow-up for Phone Call Additional follow up Details #1::        Patient notified as instructed by telephone. Medication phoned to [facility] as instructed. Lewanda Rife LPN  October 12, 2009 11:32 AM     New/Updated Medications: PRILOSEC 20 MG CPDR (OMEPRAZOLE) 1 by mouth once daily in am before breakfast, 1 by mouth in  evening Prescriptions: PRILOSEC 20 MG CPDR (OMEPRAZOLE) 1 by mouth once daily in am before breakfast, 1 by mouth in  evening  #60 x 11   Entered and Authorized by:   Judith Part MD   Signed by:   Lewanda Rife LPN on 65/78/4696   Method used:   Telephoned to ...       [facility]  [phone][facility] (retail)       [address] [place], [place]  [address]       Ph: [phone] or [phone]       Fax: [phone]   RxID:   [id number]

## 2010-08-21 NOTE — Assessment & Plan Note (Signed)
Summary: ?ACID REFLUX/CLE   Vital Signs:  Patient profile:   60 year old male Height:      66.75 inches Weight:      180 pounds BMI:     28.51 Temp:     98.1 degrees F oral Pulse rate:   68 / minute Pulse rhythm:   regular BP sitting:   112 / 80  (left arm) Cuff size:   regular  Vitals Entered By: Delilah Shan CMA Duncan Dull) (September 20, 2009 12:03 PM) CC: ? acid reflux   History of Present Illness: here for poss acid reflux  is having discomfort in his throat -- gets better and then worse  rich food and wine may have triggered it last weekend  feels like he swallowed something really hot -- goes down into chest no burping or spitting acid  tums otc do help a little bit   has not tried any other meds yet   finds himself with hoarseness and clears throat more this week  no nasal symptoms  a little nausea from too many tums lately   energy level goes up and down   has lost 7 lb from last visit -- is making the effort to eat better and walk and less snacking  no more desserts  no trouble swallowing   is drinking coffee 3 cups in am and 1 in afternoon (does not aggrivate it immediately) in general if he eats it feels a little better  no vomiting  no other caffiene or sodas  eats spicy food somewhat frequently  no acidic beverages  is a gum chewer - not always mint    very busy with work in general   wt is down 7 lb from last visit   bp is good   stress level is again through the roof -and working very hard as usual   2-3 cocktails at night - he knows this is too much    Allergies: 1)  ! Zoloft  Past History:  Past Medical History: Last updated: 01/31/2009 Allergic rhinitis Depression/anx ADD Hyperlipidemia AKs R hip bursitis L knee meniscal tear   derm- [facility]- [facility]  Past Surgical History: Last updated: 01/07/2007 Left knee meniscal tear C-7 old fracture  Family History: Last updated: 2007-04-06 Father: CAD- deceased age  23 Mother: HTN, depression Siblings:  P uncle with CAD MGF with COPD  Social History: Last updated: 06/10/2008 Marital Status: Married Children: 1 Occupation: banker non smoker  Risk Factors: Smoking Status: never (01/07/2007)  Review of Systems General:  Complains of fatigue; denies chills, fever, loss of appetite, and malaise. Eyes:  Denies blurring, double vision, and eye irritation. CV:  Denies chest pain or discomfort, palpitations, shortness of breath with exertion, and swelling of feet. Resp:  Denies cough and wheezing. GI:  Complains of gas, hemorrhoids, indigestion, and nausea; denies abdominal pain, bloody stools, change in bowel habits, and vomiting. GU:  Denies dysuria and urinary frequency. MS:  Denies joint pain. Derm:  Denies itching, lesion(s), poor wound healing, and rash. Neuro:  Denies numbness and tingling. Psych:  Denies irritability, mental problems, panic attacks, and sense of great danger; very stressed all the time. Endo:  Denies cold intolerance, excessive thirst, excessive urination, and heat intolerance. Heme:  Denies abnormal bruising and bleeding.  Physical Exam  General:  Well-developed,well-nourished,in no acute distress; alert,appropriate and cooperative throughout examination Head:  normocephalic, atraumatic, and no abnormalities observed.  Eyes:  vision grossly intact, pupils equal, pupils round, and pupils reactive  to light.  no conjunctival pallor, injection or icterus  Mouth:  pharynx pink and moist and no erythema.   Neck:  supple with full rom and no masses or thyromegally, no JVD or carotid bruit  Lungs:  Normal respiratory effort, chest expands symmetrically. Lungs are clear to auscultation, no crackles or wheezes. Heart:  Normal rate and regular rhythm. S1 and S2 normal without gallop, murmur, click, rub or other extra sounds. Abdomen:  mild epigastric tenderness without rebound or gaurding soft, no distention, no masses, no  hepatomegaly, and no splenomegaly.   Msk:  No deformity or scoliosis noted of thoracic or lumbar spine.   Extremities:  No clubbing, cyanosis, edema, or deformity noted with normal full range of motion of all joints.   Neurologic:  sensation intact to light touch, gait normal, and DTRs symmetrical and normal.  no tremor Skin:  Intact without suspicious lesions or rashes no jaundice or pallor Cervical Nodes:  No lymphadenopathy noted Inguinal Nodes:  No significant adenopathy Psych:  somewhat anxous and with pressured speech--- this is fairly baseline for him  very intense and hyper appearing   Impression & Recommendations:  Problem # 1:  GERD (ICD-530.81) Assessment New with heartburn and throat symptoms  many risk factors incl caffiene/ alcohol/ stress/ poor diet  disc lifestyle change in detail as well as tx strategy  trial of prilosec in am before breakfast  f/u 1 mo  given handout from [facility] update if not imp in meantime  His updated medication list for this problem includes:    Prilosec 20 Mg Cpdr (Omeprazole) .Marland Kitchen... 1 by mouth once daily in am before breakfast  Problem # 2:  ADD (ICD-314.00) Assessment: Unchanged is stable - refil med today  Complete Medication List: 1)  Crestor 40 Mg Tabs (Rosuvastatin calcium) .... One by mouth daily 2)  Ritalin Sr 20 Mg Tbcr (Methylphenidate hcl) .... Take one by mouth daily 3)  Viagra 50 Mg Tabs (Sildenafil citrate) .Marland Kitchen.. 1 by mouth as directed as needed before sexual activity 4)  Paxil Cr 12.5 Mg Xr24h-tab (Paroxetine hcl) .Marland Kitchen.. 1 by mouth once daily 5)  Prilosec 20 Mg Cpdr (Omeprazole) .Marland Kitchen.. 1 by mouth once daily in am before breakfast  Patient Instructions: 1)  gradually cut your coffee by one cup every 2 weeks -- less if much better  2)  try to avoid alcohol as much as possibe  3)  try not to eat late at night  4)  avoid mint items if possible , and very spicy food 5)  try to work on stress best you can  6)  start omeprazole 20 mg  one pill daily in am before breakfast  7)  follow up with me in about 4 weeks please  Prescriptions: RITALIN SR 20 MG  TBCR (METHYLPHENIDATE HCL) take one by mouth daily  #30 x 0   Entered and Authorized by:   Judith Part MD   Signed by:   Judith Part MD on 09/20/2009   Method used:   Print then Give to Patient   RxID:   [id number] PRILOSEC 20 MG CPDR (OMEPRAZOLE) 1 by mouth once daily in am before breakfast  #30 x 11   Entered and Authorized by:   Judith Part MD   Signed by:   Judith Part MD on 09/20/2009   Method used:   Print then Give to Patient   RxID:   [id number]   Current Allergies (reviewed today): ! ZOLOFT

## 2010-08-21 NOTE — Assessment & Plan Note (Signed)
Summary: cough, congestion/tsc   Vital Signs:  Patient Profile:   60 Years Old Male Height:     66.75 inches Weight:      181.0 pounds Temp:     98.4 degrees F oral Pulse rate:   76 / minute Pulse rhythm:   regular BP sitting:   124 / 90  (left arm) Cuff size:   regular  Vitals Entered By: Kern Reap CMA (July 21, 2008 12:40 PM)                 Chief Complaint:  chest congestion.  Acute Visit History:      The patient complains of cough, nasal discharge, and sore throat.  These symptoms began one day ago.  He denies earache and sinus problems.  Other comments include: similar symptoms 1 month ago, but got 100 % better fatigue.        The character of the cough is described as nonproductive.          Prior Medications Reviewed Using: Patient Recall  Updated Prior Medication List: CRESTOR 40 MG TABS (ROSUVASTATIN CALCIUM) one by mouth daily RITALIN SR 20 MG  TBCR (METHYLPHENIDATE HCL) take one by mouth daily  Current Allergies (reviewed today): ! ZOLOFT  Past Medical History:    Reviewed history from 01/07/2007 and no changes required:       Allergic rhinitis       Depression       Hyperlipidemia   Social History:    Reviewed history from 06/10/2008 and no changes required:       Marital Status: Married       Children: 1       Occupation: Psychologist, occupational       non smoker    Review of Systems      See HPI       no SOB, no wheeze   Physical Exam  General:     Well-developed,well-nourished,in no acute distress; alert,appropriate and cooperative throughout examination Ears:     External ear exam shows no significant lesions or deformities.  Otoscopic examination reveals clear canals, tympanic membranes are intact bilaterally without bulging, retraction, inflammation or discharge. Hearing is grossly normal bilaterally. Nose:     External nasal examination shows no deformity or inflammation. Nasal mucosa are pink and moist without lesions or  exudates. Mouth:     pharyngeal erythema, no exudate Lungs:     Normal respiratory effort, chest expands symmetrically. Lungs are clear to auscultation, no crackles or wheezes. Heart:     Normal rate and regular rhythm. S1 and S2 normal without gallop, murmur, click, rub or other extra sounds.     Impression & Recommendations:  Problem # 1:  URI (ICD-465.9) Symptomatic care. Reviewed cold timeline. Call if notgradually  improving after 7-10 days. The following medications were removed from the medication list:    Tussionex Pennkinetic Er 8-10 Mg/78ml Lqcr (Chlorpheniramine-hydrocodone) .Marland Kitchen... 1/2 to 1 tsp up to two times a day as needed severe cough  His updated medication list for this problem includes:    Tussionex Pennkinetic Er 8-10 Mg/58ml Lqcr (Chlorpheniramine-hydrocodone) .Marland Kitchen... 1 tsp by mouth at bedtime   Complete Medication List: 1)  Crestor 40 Mg Tabs (Rosuvastatin calcium) .... One by mouth daily 2)  Ritalin Sr 20 Mg Tbcr (Methylphenidate hcl) .... Take one by mouth daily 3)  Tussionex Pennkinetic Er 8-10 Mg/28ml Lqcr (Chlorpheniramine-hydrocodone) .Marland Kitchen.. 1 tsp by mouth at bedtime    Prescriptions: TUSSIONEX PENNKINETIC ER 8-10 MG/5ML LQCR (CHLORPHENIRAMINE-HYDROCODONE)  1 tsp by mouth at bedtime  #8 oz x 0   Entered and Authorized by:   Kerby Nora MD   Signed by:   Kerby Nora MD on 07/21/2008   Method used:   Print then Give to Patient   RxID:   [id number]  ]

## 2010-08-21 NOTE — Progress Notes (Signed)
Summary: needs ritalin refilled  Phone Note Call from Patient Call back at Work Phone [phone]   Caller: Patient Call For: dr tower Summary of Call: needs ritalin refilled, call when ready Initial call taken by: Lowella Petties,  March 14, 2008 3:53 PM  Follow-up for Phone Call        printed and in my out box for pick up   Follow-up by: Judith Part MD,  March 14, 2008 5:00 PM  Additional Follow-up for Phone Call Additional follow up Details #1::        Advised patient.  ......................................................Marland KitchenLiane Comber March 14, 2008 5:02 PM Rx left at front desk for patient to pick up. .............................................................Marland KitchenMarcelle Smiling Chavers March 14, 2008 5:02 PM     New/Updated Medications: RITALIN SR 20 MG  TBCR (METHYLPHENIDATE HCL) take one by mouth daily    Prescriptions: RITALIN SR 20 MG  TBCR (METHYLPHENIDATE HCL) take one by mouth daily  #30 x 0   Entered and Authorized by:   Judith Part MD   Signed by:   Judith Part MD on 03/14/2008   Method used:   Print then Give to Patient   RxID:   [id number]
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
## 2010-08-21 NOTE — Assessment & Plan Note (Signed)
Summary: flu shot/bir   Nurse Visit    Prior Medications: LIPITOR 20 MG  TABS (ATORVASTATIN CALCIUM) one by mouth qd RITALIN SR 20 MG  TBCR (METHYLPHENIDATE HCL) take one by mouth daily PAXIL CR 12.5 MG  TB24 (PAROXETINE HCL) 1 by mouth q evening ALPRAZOLAM 0.5 MG  TABS (ALPRAZOLAM) 1 by mouth two times a day as needed for severe anxiety PAXIL 20 MG  TABS (PAROXETINE HCL)  VITAMIN D ()  ASA 81 MG QD ()  Current Allergies: ! ZOLOFT    Orders Added: 1)  Admin 1st Vaccine [90471] 2)  Flu Vaccine 18yrs + [62952]   Flu Vaccine Consent Questions     Do you have a history of severe allergic reactions to this vaccine? no    Any prior history of allergic reactions to egg and/or gelatin? no    Do you have a sensitivity to the preservative Thimersol? no    Do you have a past history of Guillan-Barre Syndrome? no    Do you currently have an acute febrile illness? no    Have you ever had a severe reaction to latex? no    Vaccine information given and explained to patient? yes    Are you currently pregnant? no    Lot Number:[id number]   Site Given  Left Deltoid [email or web address]

## 2010-08-21 NOTE — Progress Notes (Signed)
Summary: ritalin rx ([facility] pt)   Phone Note Refill Request Call back at Work Phone [phone] Call back at ok to leave message on machine Message from:  Patient on July 10, 2007 10:08 AM  Refills Requested: Medication #1:  RITALIN SR 20 MG  TBCR take one by mouth daily Initial call taken by: Liane Comber,  July 10, 2007 10:09 AM  Follow-up for Phone Call        Rx done Follow-up by: Cindee Salt MD,  July 10, 2007 10:29 AM      Prescriptions: RITALIN SR 20 MG  TBCR (METHYLPHENIDATE HCL) take one by mouth daily  #30 x 0   Entered and Authorized by:   Cindee Salt MD   Signed by:   Cindee Salt MD on 07/10/2007   Method used:   Print then Give to Patient   RxID:   [id number]

## 2010-08-21 NOTE — Assessment & Plan Note (Signed)
Summary: 4WK FOLLOW WEEK / LFW   Vital Signs:  Patient profile:   60 year old male Height:      66.75 inches Weight:      182.25 pounds BMI:     28.86 Temp:     98 degrees F oral Pulse rate:   68 / minute Pulse rhythm:   regular BP sitting:   114 / 74  (left arm) Cuff size:   regular  Vitals Entered By: Lewanda Rife LPN (October 18, 2009 8:33 AM) CC: 4 week f/u   History of Present Illness: here for f/u of GERD was started on omeprazole 20 mg once daily and then inc to two times a day after some improvement but mid day break through symptoms   feels a lot better  two times a day improved him any more  overall no symptoms at all  throat is a lot less tender    lifestyle -- has had to change a lot  coffee - had to stop with all caffine completely  no diet coke / no tea   very small amt of alcohol seems ok if he drinks it slow    is also sleeping better   is walking a lot for exercise        Past History:  Past Medical History: Last updated: 01/31/2009 Allergic rhinitis Depression/anx ADD Hyperlipidemia AKs R hip bursitis L knee meniscal tear   derm- [facility]- [facility]  Past Surgical History: Last updated: 01/07/2007 Left knee meniscal tear C-7 old fracture  Family History: Last updated: 04-05-2007 Father: CAD- deceased age 33 Mother: HTN, depression Siblings:  P uncle with CAD MGF with COPD  Social History: Last updated: 10/18/2009 Marital Status: Married Children: 1 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 Eyes:  Denies blurring and eye pain. CV:  Denies chest pain or discomfort, palpitations, shortness of breath with exertion, and swelling of  feet. Resp:  Denies cough, shortness of breath, and wheezing; does occ clear his throat . GI:  Complains of indigestion; denies abdominal pain, bloody stools, change in bowel habits, and nausea. GU:  Denies dysuria and hematuria. Derm:  Denies itching, lesion(s), poor wound healing, and rash. Neuro:  Denies headaches, tingling, and tremors. Psych:  Denies anxiety and depression; very stressed as usual. Endo:  Denies cold intolerance, excessive thirst, excessive urination, and heat intolerance. Heme:  Denies abnormal bruising and bleeding.  Physical Exam  General:  Well-developed,well-nourished,in no acute distress; alert,appropriate and cooperative throughout examination Head:  normocephalic, atraumatic, and no abnormalities observed.   Eyes:  vision grossly intact, pupils equal, pupils round, and pupils reactive to light.  no conjunctival pallor, injection or icterus  Mouth:  pharynx pink and moist.   Neck:  No deformities, masses, or tenderness noted. Lungs:  Normal respiratory effort, chest expands symmetrically. Lungs are clear to auscultation, no crackles or wheezes. Heart:  Normal rate and regular rhythm. S1 and S2 normal without gallop, murmur, click, rub or other extra sounds. Abdomen:  Bowel sounds positive,abdomen soft and non-tender without masses, organomegaly or hernias noted. Extremities:  No clubbing, cyanosis, edema, or deformity noted with normal full range of motion of all joints.   Neurologic:  sensation intact to light touch, gait normal,  and DTRs symmetrical and normal.  no tremor  Skin:  Intact without suspicious lesions or rashes Cervical Nodes:  No lymphadenopathy noted Psych:  a little more relaxed today still has rapid speech   Impression & Recommendations:  Problem # 1:  GERD (ICD-530.81) Assessment Improved much imp with two times a day dosing and no caffiene sleep is even much imp disc lifestyle change/diet in detail - will have to experiment with what  he can tolerate but in general avoid nsaids / spicy food/ caff/ acidic foods and bev/ stress   urged to keep up the walking  plan to wean to once daily after 2 mo and then as needed if able  will f/u 3 mo  if throat irritation does not completely resolve will need to consider GI consult   His updated medication list for this problem includes:    Prilosec 20 Mg Cpdr (Omeprazole) .Marland Kitchen... 1 by mouth once daily in am before breakfast, 1 by mouth in  evening  Problem # 2:  ADD (ICD-314.00) Assessment: Comment Only refil med today-no changes   Complete Medication List: 1)  Crestor 40 Mg Tabs (Rosuvastatin calcium) .... One by mouth daily 2)  Ritalin Sr 20 Mg Tbcr (Methylphenidate hcl) .... Take one by mouth daily 3)  Viagra 50 Mg Tabs (Sildenafil citrate) .Marland Kitchen.. 1 by mouth as directed as needed before sexual activity 4)  Paxil Cr 12.5 Mg Xr24h-tab (Paroxetine hcl) .Marland Kitchen.. 1 by mouth once daily 5)  Prilosec 20 Mg Cpdr (Omeprazole) .Marland Kitchen.. 1 by mouth once daily in am before breakfast, 1 by mouth in  evening  Patient Instructions: 1)  continue omeprazole two times a day for 2 more months 2)  then cut down to once daily for one month  3)  then see if you can stop it  4)  if you have symptoms more than twice weekly- you will need to remain on daily therapy  5)  stay off of caffiene  6)  watch anti inflammatory like ibuprofen may make you worse (anything but tylenol)  7)  follow up with me in about 3 months  8)  if throat symptoms do not resolve entirely - will refer to GI  Prescriptions: RITALIN SR 20 MG  TBCR (METHYLPHENIDATE HCL) take one by mouth daily  #30 x 0   Entered and Authorized by:   Judith Part MD   Signed by:   Judith Part MD on 10/18/2009   Method used:   Print then Give to Patient   RxID:   [id number] PRILOSEC 20 MG CPDR (OMEPRAZOLE) 1 by mouth once daily in am before breakfast, 1 by mouth in  evening  #60 x 11   Entered and Authorized by:   Judith Part MD   Signed  by:   Judith Part MD on 10/18/2009   Method used:   Print then Give to Patient   RxID:   [id number]

## 2010-08-21 NOTE — Assessment & Plan Note (Signed)
Summary: complete physical/discuss labs   Vital Signs:  Patient profile:   60 year old male Height:      66.75 inches Weight:      179.25 pounds BMI:     28.39 Temp:     97.8 degrees F oral Pulse rate:   68 / minute Pulse rhythm:   regular BP sitting:   114 / 72  (left arm) Cuff size:   regular  Vitals Entered By: Lewanda Rife LPN (November 13, 2009 9:30 AM) CC: complete physical and discuss labs   History of Present Illness: here for wellness exam and to disc chronic problems   bp good 114/72 today  cholesterol imp with LDL of 63 and HDL of 50--on crestor and diet   also lost 3 lb-- has been working on diet and exercise   psa 1.79- wnl and not big change from past   still on ADD med -- is working for him  is still getting by without caffiene - stomach and throat are much better    Td 06- up to date   no prostate problems  only nocturia is if he drinks lots of water before bedtime   saw derm- benign lesion removed from his nose   does not want colonosc yet- will do immunoassay card      Allergies (verified): No Known Drug Allergies  Past History:  Past Medical History: Last updated: 01/31/2009 Allergic rhinitis Depression/anx ADD Hyperlipidemia AKs R hip bursitis L knee meniscal tear   derm- [facility]- [facility]  Past Surgical History: Last updated: 01/07/2007 Left knee meniscal tear C-7 old fracture  Family History: Last updated: March 21, 2007 Father: CAD- deceased age 68 Mother: HTN, depression Siblings:  P uncle with CAD MGF with COPD  Social History: Last updated: 10/18/2009 Marital Status: Married Children: 1 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 Eyes:  Denies blurring and eye pain. CV:  Denies chest pain or discomfort, palpitations, and swelling of feet. Resp:  Denies cough,  sputum productive, and wheezing. GI:  Denies abdominal pain, change in bowel habits, and indigestion. GU:  Complains of erectile dysfunction; denies discharge, dysuria, nocturia, urinary frequency, and urinary hesitancy. MS:  Denies joint pain, joint redness, joint swelling, muscle aches, and muscle weakness. Derm:  Denies itching, lesion(s), poor wound healing, and rash. Neuro:  Denies numbness and tingling. Psych:  Denies anxiety and depression. Endo:  Denies excessive thirst and excessive urination. Heme:  Denies abnormal bruising and bleeding.  Physical Exam  General:  Well-developed,well-nourished,in no acute distress; alert,appropriate and cooperative throughout examination Head:  normocephalic, atraumatic, and no abnormalities observed.   Eyes:  vision grossly intact, pupils equal, pupils round, and pupils reactive to light.  no conjunctival pallor, injection or icterus  Ears:  R ear normal and L ear normal.   Nose:  no nasal discharge.   Mouth:  pharynx pink and moist.   Neck:  supple with full rom and no masses or thyromegally, no JVD or carotid bruit  Chest Wall:  No deformities, masses, tenderness or gynecomastia noted. Lungs:  Normal respiratory effort, chest expands symmetrically. Lungs are clear to auscultation, no crackles or wheezes. Heart:  Normal rate and regular rhythm. S1 and S2 normal without gallop, murmur, click, rub or other extra sounds. Abdomen:  Bowel sounds positive,abdomen soft and non-tender without masses, organomegaly or hernias noted. no renal  bruits  Rectal:  No external abnormalities noted. Normal sphincter tone. No rectal masses or tenderness. Prostate:  Prostate gland firm and smooth, no enlargement, nodularity, tenderness, mass, asymmetry or induration. Msk:  No deformity or scoliosis noted of thoracic or lumbar spine.  no acute joint changes  Pulses:  R and L carotid,radial,femoral,dorsalis pedis and posterior tibial pulses are full and equal  bilaterally Extremities:  No clubbing, cyanosis, edema, or deformity noted with normal full range of motion of all joints.   Neurologic:  sensation intact to light touch, gait normal, and DTRs symmetrical and normal.  no tremor  Skin:  Intact without suspicious lesions or rashes Cervical Nodes:  No lymphadenopathy noted Inguinal Nodes:  No significant adenopathy Psych:  generally hyperactive which is his baseline- but speech is less pressured than usual pleasant and talkative    Impression & Recommendations:  Problem # 1:  HEALTH MAINTENANCE EXAM (ICD-V70.0) Assessment Comment Only reviewed health habits including diet, exercise and skin cancer prevention reviewed health maintenance list and family history  rev labs in detail- all ok incl psa  disc prostate screen and dre done today  Problem # 2:  GERD (ICD-530.81) Assessment: Improved still much imp throat and GI symptoms on omeprazole and no caffiene urged to keep it up and update if symptoms return  His updated medication list for this problem includes:    Prilosec 20 Mg Cpdr (Omeprazole) .Marland Kitchen... 1 by mouth once daily in am before breakfast, 1 by mouth in  evening  Problem # 3:  HYPERLIPIDEMIA (ICD-272.4) Assessment: Improved cholesterol is best ever- commended on good diet also continue crestor  rev low sat fat diet   His updated medication list for this problem includes:    Crestor 40 Mg Tabs (Rosuvastatin calcium) ..... One by mouth daily  Problem # 4:  ADD (ICD-314.00) Assessment: Comment Only fairly controlled with current med - encouraged exercise   Complete Medication List: 1)  Crestor 40 Mg Tabs (Rosuvastatin calcium) .... One by mouth daily 2)  Ritalin Sr 20 Mg Tbcr (Methylphenidate hcl) .... Take one by mouth daily 3)  Viagra 50 Mg Tabs (Sildenafil citrate) .Marland Kitchen.. 1 by mouth as directed as needed before sexual activity 4)  Prilosec 20 Mg Cpdr (Omeprazole) .Marland Kitchen.. 1 by mouth once daily in am before breakfast, 1 by  mouth in  evening 5)  Glucosamine-chondroitin 500-400 Mg Caps (Glucosamine-chondroitin) .... Otc as directed.  Other Orders: Prescription Created Electronically [id number])  Patient Instructions: 1)  keep up the work with healthy diet and exercise  2)  labs and follow up is one year unless needed earlier 3)  no change in medicines  Prescriptions: VIAGRA 50 MG TABS (SILDENAFIL CITRATE) 1 by mouth as directed as needed before sexual activity  #9 x 11   Entered and Authorized by:   Judith Part MD   Signed by:   Judith Part MD on 11/13/2009   Method used:   Electronically to        [facility]  [phone]* (retail)       [address] [place], [place]  [address]       Ph: [phone] or [phone]       Fax: [phone]   RxID:   [id number] RITALIN SR 20 MG  TBCR (METHYLPHENIDATE HCL) take one by mouth daily  #30 x 0   Entered and Authorized by:   Judith Part MD   Signed by:   [employer]  Rose Fillers MD on 11/13/2009   Method used:   Print then Give to Patient   RxID:   [id number] CRESTOR 40 MG TABS (ROSUVASTATIN CALCIUM) one by mouth daily  #90 x 3   Entered and Authorized by:   Judith Part MD   Signed by:   Judith Part MD on 11/13/2009   Method used:   Electronically to        [facility]  [phone]* (retail)       [address] [place], [place]  [address]       Ph: [phone] or [phone]       Fax: [phone]   RxID:   [id number]   Current Allergies (reviewed today): No known allergies

## 2010-08-21 NOTE — Progress Notes (Signed)
Summary: RX Refill   Phone Note Refill Request Message from:  Fax from Pharmacy on July 10, 2007 8:30 AM  Refills Requested: Medication #1:  PAXIL CR 12.5 MG  TB24 1 by mouth q evening Initial call taken by: Mickle Asper,  July 10, 2007 8:31 AM  Follow-up for Phone Call        okay x 1 year No fax on my desk Follow-up by: Cindee Salt MD,  July 10, 2007 10:30 AM  Additional Follow-up for Phone Call Additional follow up Details #1::        rx called to [phone] Additional Follow-up by: Wandra Mannan,  July 10, 2007 10:54 AM

## 2010-08-21 NOTE — Assessment & Plan Note (Signed)
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 jobs   on crestor for lipids with fair control  trig 87/ HDL 47 and LDL 121-- down from 130 hesitant to push dose due to borderline lfts with ast 42 and ALT 38 diet - is overall fair .Marland Kitchen breakfast- cereal Caryl Comes /, crackers for snack, occ has lunch (not always) occ potato chips  eats hamburger once per week  overall is not a big meat eater in general  does eat french fries  eggs once per week  occ sausage  wt is up 6 lb today   ADD- is doing ok overall on ritalin  thinks it does help  does not take it every day   mood/anx -- overall stable - not on any med at this time  not overly anx sleeps and eats ok       Allergies: 1)  ! Zoloft  Past History:  Past Surgical History: Last updated: 01/07/2007 Left knee meniscal tear C-7 old fracture  Family History: Last updated: 04-15-2007 Father: CAD- deceased age 18 Mother: HTN, depression Siblings:  P uncle with CAD MGF with COPD  Social History: Last updated: 06/10/2008 Marital Status: Married Children: 1 Occupation: banker non smoker  Risk Factors: Smoking Status: never (01/07/2007)  Past Medical History: Allergic rhinitis Depression/anx ADD Hyperlipidemia AKs R hip bursitis L knee meniscal tear   derm- [facility]- [facility]  Review of Systems General:  Denies fatigue, fever, loss of appetite, and sleep disorder. Eyes:   Denies blurring. CV:  Denies chest pain or discomfort and palpitations. Resp:  Denies cough and wheezing. GI:  Denies abdominal pain and change in bowel habits. GU:  Denies urinary frequency. MS:  Complains of joint pain; sees Dr Despina Hick for joint pain- hip bursitis occ ibuprofen. Derm:  Denies lesion(s) and rash. Neuro:  Denies numbness and tingling. Psych:  Denies anxiety and depression. Endo:  Denies excessive thirst and excessive urination.  Physical Exam  General:  Well-developed,well-nourished,in no acute distress; alert,appropriate and cooperative throughout examination Head:  normocephalic, atraumatic, and no abnormalities observed.  Eyes:  vision grossly intact, pupils equal, pupils round, and pupils reactive to light.  no conjunctival pallor, injection or icterus  Mouth:  pharynx pink and moist.   Neck:  supple with full rom and no masses or thyromegally, no JVD or carotid bruit  Chest Wall:  No deformities, masses, tenderness or gynecomastia noted. Lungs:  Normal respiratory effort, chest expands symmetrically. Lungs are clear to auscultation, no crackles or wheezes. Heart:  Normal rate and regular rhythm. S1 and S2 normal without gallop, murmur, click, rub or other extra sounds. Abdomen:  Bowel sounds positive,abdomen soft and non-tender without masses, organomegaly or hernias noted. no renal bruits  Msk:  No deformity or scoliosis noted of thoracic or  lumbar spine.   Pulses:  R and L carotid,radial,femoral,dorsalis pedis and posterior tibial pulses are full and equal bilaterally Extremities:  No clubbing, cyanosis, edema, or deformity noted with normal full range of motion of all joints.   Neurologic:  sensation intact to light touch, gait normal, and DTRs symmetrical and normal.  no tremor  Skin:  Intact without suspicious lesions or rashes Cervical Nodes:  No lymphadenopathy noted Psych:  as usual- slt hyperactive demeanor- but pleasant and talkative / a little more anx  than usual has to repeat phrases multiple times   Impression & Recommendations:  Problem # 1:  HYPERLIPIDEMIA (ICD-272.4) Assessment Unchanged  overall fairly controlled- could be better  disc low sat fat diet in detail and handout given adv to stop fried food and red meat re check 3 mo  His updated medication list for this problem includes:    Crestor 40 Mg Tabs (Rosuvastatin calcium) ..... One by mouth daily  Labs Reviewed: SGOT: 38 (11/29/2008)   SGPT: 42 (11/29/2008)   HDL:47.30 (11/29/2008), 46.80 (10/25/2008)  LDL:121 (11/29/2008), 92 (14/78/2956)  Chol:186 (11/29/2008), 213 (10/25/2008)  Trig:87.0 (11/29/2008), 143.0 (10/25/2008)  Problem # 2:  ADD (ICD-314.00) Assessment: Unchanged per pt - doing well as usual - rapid speech and flight of ideas during hx today-- but better than in thepast adv to keep up good exercise and avoid caffiene  Problem # 3:  ANXIETY STATE NOS (ICD-300.00) Assessment: Unchanged per pt doing well with this- no med or counseling currently  disc imp of exercise   Complete Medication List: 1)  Crestor 40 Mg Tabs (Rosuvastatin calcium) .... One by mouth daily 2)  Ritalin Sr 20 Mg Tbcr (Methylphenidate hcl) .... Take one by mouth daily 3)  Viagra 50 Mg Tabs (Sildenafil citrate) .Marland Kitchen.. 1 by mouth as directed as needed before sexual activity  Patient Instructions: 1)  you can raise your HDL (good cholesterol) by increasing exercise and eating omega 3 fatty acid supplement like fish oil or flax seed oil over the counter 2)  you can lower LDL (bad cholesterol) by limiting saturated fats in diet like red meat, fried foods, egg yolks, fatty breakfast meats, high fat dairy products and shellfish  3)  no change in medicines  4)  schedule fasting labs in 3 months lipid/ast/alt 272   Prior Medications (reviewed today): CRESTOR 40 MG TABS (ROSUVASTATIN CALCIUM) one by mouth daily RITALIN SR 20 MG  TBCR (METHYLPHENIDATE HCL) take one by mouth daily VIAGRA 50  MG TABS (SILDENAFIL CITRATE) 1 by mouth as directed as needed before sexual activity Current Allergies (reviewed today): ! ZOLOFT Current Medications (including changes made in today's visit):  CRESTOR 40 MG TABS (ROSUVASTATIN CALCIUM) one by mouth daily RITALIN SR 20 MG  TBCR (METHYLPHENIDATE HCL) take one by mouth daily VIAGRA 50 MG TABS (SILDENAFIL CITRATE) 1 by mouth as directed as needed before sexual activity

## 2010-08-21 NOTE — Progress Notes (Signed)
Summary: Ritalin SR 20 MG  Phone Note Refill Request Call back at Work Phone [phone] Message from:  Patient on July 18, 2009 3:03 PM  Refills Requested: Medication #1:  RITALIN SR 20 MG  TBCR take one by mouth daily   Last Refilled: 04/17/2009 Please call patient when ready for pick up    Method Requested: Pick up at Office Initial call taken by: Melody Comas,  July 18, 2009 3:04 PM Caller: Patient Call For: Judith Part MD  Follow-up for Phone Call        printed in put in nurse in box for pickup  Follow-up by: Judith Part MD,  July 18, 2009 3:44 PM  Additional Follow-up for Phone Call Additional follow up Details #1::        Advised pt ok to pick up script. Additional Follow-up by: Lowella Petties CMA,  July 18, 2009 4:03 PM    Prescriptions: RITALIN SR 20 MG  TBCR (METHYLPHENIDATE HCL) take one by mouth daily  #30 x 0   Entered and Authorized by:   Judith Part MD   Signed by:   Judith Part MD on 07/18/2009   Method used:   Print then Give to Patient   RxID:   [id number]

## 2010-08-21 NOTE — Progress Notes (Signed)
Summary: Paroxetine  Phone Note [facility]ill Request [phone] on November 02, 2008 8:41 AM  e-scribe request: medication not on active med list. Paroxetine CR 12.5 take 1 tablet by mouth once daily. last refilled 09/19/2007   Method Requested: Telephone to Pharmacy Initial call taken by: Mervin Hack CMA,  November 02, 2008 8:42 AM  Follow-up for Phone Call        please check in with pt -- ? re started it  was removed from med list in fall- ? error just let me know  ok to refil times 5 if he has re started it  Follow-up by: Judith Part MD,  November 02, 2008 8:51 AM  Additional Follow-up for Phone Call Additional follow up Details #1::        pt says he did not request it he is no longer on med. Natasha Chavers  November 03, 2008 9:09 AM

## 2010-08-21 NOTE — Progress Notes (Signed)
Summary: rx and referral   Phone Note Call from Patient Call back at Work Phone [phone]   Caller: Patient Call For: tower Summary of Call: pt wants ritalin  rx and also wants a referal to cardiologist dr wall. Initial call taken by: Liane Comber,  May 20, 2007 8:36 AM  Follow-up for Phone Call        I need to know what the referral is for- ? symptoms I gave him px for his ritalin when he was here this am Follow-up by: Judith Part MD,  May 20, 2007 10:30 AM  Additional Follow-up for Phone Call Additional follow up Details #1::        he wants eval because of his fam hx of heart disease- but will wait till his labs come back and will wait for your opinion  I am fine with referral to eval risks/screening I will do the referral and foward to [facility] Additional Follow-up by: Lowella Petties,  May 20, 2007 2:31 PM  New Problems: HX, FAMILY, ISCHEMIC HEART DISEASE (ICD-V17.3)   Additional Follow-up for Phone Call Additional follow up Details #2::    APPT MADE WITH DR TOM WALL ON 06/05/07 .PT NOTIFIED.  ...................................................................Carlton Adam  May 21, 2007 11:30 AM  Follow-up by: Carlton Adam,  May 21, 2007 11:30 AM  New Problems: HX, FAMILY, ISCHEMIC HEART DISEASE (ICD-V17.3)

## 2010-08-21 NOTE — Letter (Signed)
Summary: [facility]: Immunoassay Fecal Occult Blood (iFOB) Order Form   at [facility]  [address] [place], [place] [address]   Phone: [phone]  Fax: [phone]       Lab: Immunoassay Fecal Occult Blood (iFOB) Order Form   November 13, 2009 MRN: [id number]   NAYSON TRAWEEK 03-Jan-1951   Physicican Name:____Tower [id number]_____________________  Diagnosis Code:__________________________      Judith Part MD

## 2010-08-23 NOTE — Progress Notes (Signed)
Summary: refill request for ritalin  Phone Note Refill Request Call back at Work Phone [phone] Message from:  Patient  Refills Requested: Medication #1:  RITALIN SR 20 MG  TBCR take one by mouth daily Please call when ready.  Initial call taken by: Lowella Petties CMA, AAMA,  June 29, 2010 2:21 PM  Follow-up for Phone Call        printed in put in nurse in box for pickup  Follow-up by: Judith Part MD,  June 29, 2010 3:26 PM  Additional Follow-up for Phone Call Additional follow up Details #1::        Patient notified as instructed by telephone. Prescription left at front desk. Lewanda Rife LPN  July 02, 2010 11:03 AM     Prescriptions: RITALIN SR 20 MG  TBCR (METHYLPHENIDATE HCL) take one by mouth daily  #30 x 0   Entered and Authorized by:   Judith Part MD   Signed by:   Judith Part MD on 06/29/2010   Method used:   Print then Give to Patient   RxID:   [id number]

## 2010-08-23 NOTE — Assessment & Plan Note (Signed)
Summary: F/U,REFILL MEDICATION/CLE   Vital Signs:  Patient profile:   60 year old male Height:      66.75 inches Weight:      185.50 pounds BMI:     29.38 Temp:     98.5 degrees F oral Pulse rate:   76 / minute Pulse rhythm:   regular BP sitting:   120 / 78  (left arm) Cuff size:   regular  Vitals Entered By: Lewanda Rife LPN (July 12, 2010 9:23 AM) CC: follow-up visit refill med   History of Present Illness: here for f/u of chronic med probs and med refils  wt is up 6 lb  bp good at 120/78   had bad flare of heartburn over thanksgiving -- really miserable  GERD- changed to protonix from omeprazole -- was taking protonix two times a day and ran out  has not taken in 3 days at this point  this works much better  stopped alltogether drinking coffee- this has helped too / also no diet coke or tea/ no caff at all   1-2 glasses of wine at night   no breakthrough symptoms now    on crestor for cholesterol   ADD-- still fairly well controlled     Allergies: 1)  * Omeprazole  Past History:  Past Medical History: Last updated: 01/31/2009 Allergic rhinitis Depression/anx ADD Hyperlipidemia AKs R hip bursitis L knee meniscal tear   derm- [facility]- [facility]  Past Surgical History: Last updated: 01/07/2007 Left knee meniscal tear C-7 old fracture  Family History: Last updated: 04-13-07 Father: CAD- deceased age 66 Mother: HTN, depression Siblings:  P uncle with CAD MGF with COPD  Social History: Last updated: 10/18/2009 Marital Status: Married Children: 1 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 Eyes:  Denies blurring and eye irritation. CV:  Denies chest pain or discomfort, lightheadness, palpitations, and shortness of breath with exertion. Resp:  Denies cough, shortness of breath,  and wheezing. GI:  Complains of indigestion; denies abdominal pain, change in bowel habits, nausea, and vomiting. GU:  Denies urinary frequency. MS:  Complains of stiffness. Derm:  Denies itching, lesion(s), poor wound healing, and rash. Neuro:  Denies numbness and tingling. Psych:  Denies anxiety and depression. Endo:  Denies cold intolerance, excessive thirst, excessive urination, and heat intolerance. Heme:  Denies abnormal bruising and bleeding.  Physical Exam  General:  Well-developed,well-nourished,in no acute distress; alert,appropriate and cooperative throughout examination Head:  normocephalic, atraumatic, and no abnormalities observed.   Eyes:  vision grossly intact, pupils equal, pupils round, and pupils reactive to light.   Mouth:  pharynx pink and moist.   Neck:  supple with full rom and no masses or thyromegally, no JVD or carotid bruit  Lungs:  Normal respiratory effort, chest expands symmetrically. Lungs are clear to auscultation, no crackles or wheezes. Heart:  Normal rate and regular rhythm. S1 and S2 normal without gallop, murmur, click, rub or other extra sounds. Abdomen:  Bowel sounds positive,abdomen soft and non-tender without masses, organomegaly or hernias noted. no renal bruits  Extremities:  No clubbing, cyanosis, edema, or deformity noted with normal full range of motion of all joints.   Neurologic:  sensation intact to light touch, gait normal, and DTRs symmetrical and normal.  no tremor  Skin:  Intact without suspicious lesions or rashes Cervical Nodes:  No lymphadenopathy noted Psych:  rapid speech and tangential  difficult to engage at times -- he interrupts and changes subjects frequently   Impression & Recommendations:  Problem # 1:  GERD (ICD-530.81) did great with protonix two times a day and no caff will dec to once daily and see how he does  will update if symptoms return or worsen disc diet to follow His updated medication list for this  problem includes:    Protonix 40 Mg Tbec (Pantoprazole sodium) .Marland Kitchen... 1 by mouth each am about 30 min before breakfast  Complete Medication List: 1)  Crestor 40 Mg Tabs (Rosuvastatin calcium) .... One by mouth daily 2)  Ritalin Sr 20 Mg Tbcr (Methylphenidate hcl) .... Take one by mouth daily 3)  Viagra 50 Mg Tabs (Sildenafil citrate) .Marland Kitchen.. 1 by mouth as directed as needed before sexual activity 4)  Protonix 40 Mg Tbec (Pantoprazole sodium) .Marland Kitchen.. 1 by mouth each am about 30 min before breakfast  Patient Instructions: 1)  please schedule PE in april or may with labs first  2)  continue the protonix -- once per day in am 30 minutes before breakfast  3)  continue avoiding caffiene  4)  I highly recommend [facility] door  Prescriptions: PROTONIX 40 MG TBEC (PANTOPRAZOLE SODIUM) 1 by mouth each am about 30 min before breakfast  #30 x 11   Entered and Authorized by:   Judith Part MD   Signed by:   Judith Part MD on 07/12/2010   Method used:   Print then Give to Patient   RxID:   [id number]    Orders Added: 1)  Est. Patient Level III [14782]    Current Allergies (reviewed today): * OMEPRAZOLE

## 2010-09-17 ENCOUNTER — Telehealth: Payer: Self-pay | Admitting: Family Medicine

## 2010-09-18 ENCOUNTER — Encounter: Payer: Self-pay | Admitting: Family Medicine

## 2010-09-18 ENCOUNTER — Telehealth: Payer: Self-pay | Admitting: Family Medicine

## 2010-09-20 ENCOUNTER — Telehealth: Payer: Self-pay | Admitting: Family Medicine

## 2010-09-24 DIAGNOSIS — Z0279 Encounter for issue of other medical certificate: Secondary | ICD-10-CM

## 2010-09-27 ENCOUNTER — Encounter: Payer: Self-pay | Admitting: Family Medicine

## 2010-09-27 NOTE — Progress Notes (Signed)
Summary: refil request for ritalin  Phone Note Refill Request Call back at Work Phone [phone] Message from:  Patient  Refills Requested: Medication #1:  RITALIN SR 20 MG  TBCR take one by mouth daily Please call pt when ready- message signed on 2/27 without being sent to you.  Initial call taken by: Lowella Petties CMA, AAMA,  September 18, 2010 2:35 PM  Follow-up for Phone Call        printed in put in nurse in box for pickup  Follow-up by: Judith Part MD,  September 18, 2010 3:40 PM  Additional Follow-up for Phone Call Additional follow up Details #1::        Left message for patient to call back. Prescription left at front desk. Lewanda Rife LPN  September 18, 2010 4:51 PM   Advised pt script is ready. Additional Follow-up by: Lowella Petties CMA, AAMA,  September 19, 2010 12:46 PM    Prescriptions: RITALIN SR 20 MG  TBCR (METHYLPHENIDATE HCL) take one by mouth daily  #30 x 0   Entered and Authorized by:   Judith Part MD   Signed by:   Judith Part MD on 09/18/2010   Method used:   Print then Give to Patient   RxID:   [id number]

## 2010-09-27 NOTE — Progress Notes (Signed)
Summary: refill request for ritalin  Phone Note Refill Request Call back at Work Phone [phone] Message from:  Patient  Refills Requested: Medication #1:  RITALIN SR 20 MG  TBCR take one by mouth daily Please call when ready.  Initial call taken by: Lowella Petties CMA, AAMA,  September 17, 2010 3:47 PM

## 2010-10-02 NOTE — Letter (Signed)
Summary: [facility]  [facility]   Imported By: Lanelle Bal 09/24/2010 12:54:31  _____________________________________________________________________  External Attachment:    Type:   Image     Comment:   External Document

## 2010-10-02 NOTE — Progress Notes (Signed)
Summary: form for [facility]  Phone Note Call from Patient   Caller: Patient Call For: Judith Part MD Summary of Call: Patient dropped off form to be filled out from [facility]. Form is on your shelf. Initial call taken by: Melody Comas,  September 20, 2010 11:34 AM  Follow-up for Phone Call        form done and in nurse in box  Follow-up by: Judith Part MD,  September 24, 2010 8:10 AM  Additional Follow-up for Phone Call Additional follow up Details #1::        Form given to [facility] for charges.             Lowella Petties CMA, AAMA  September 24, 2010 9:46 AM

## 2010-11-08 ENCOUNTER — Telehealth: Payer: Self-pay | Admitting: Family Medicine

## 2010-11-08 DIAGNOSIS — Z0000 Encounter for general adult medical examination without abnormal findings: Secondary | ICD-10-CM

## 2010-11-08 DIAGNOSIS — E785 Hyperlipidemia, unspecified: Secondary | ICD-10-CM

## 2010-11-08 DIAGNOSIS — E7800 Pure hypercholesterolemia, unspecified: Secondary | ICD-10-CM

## 2010-11-08 DIAGNOSIS — Z125 Encounter for screening for malignant neoplasm of prostate: Secondary | ICD-10-CM

## 2010-11-08 NOTE — Telephone Encounter (Signed)
Message copied by Roxy Manns on Thu Nov 08, 2010  5:19 PM ------      Message from: Liane Comber      Created: Wed Nov 07, 2010 11:35 AM      Regarding: Cpx labs mon 4/30       Please order  future cpx labs for pt's upcomming lab appt.      Thanks      Rodney Booze

## 2010-11-19 ENCOUNTER — Other Ambulatory Visit (INDEPENDENT_AMBULATORY_CARE_PROVIDER_SITE_OTHER): Payer: BC Managed Care – PPO | Admitting: Family Medicine

## 2010-11-19 DIAGNOSIS — Z0000 Encounter for general adult medical examination without abnormal findings: Secondary | ICD-10-CM

## 2010-11-19 DIAGNOSIS — E785 Hyperlipidemia, unspecified: Secondary | ICD-10-CM

## 2010-11-19 DIAGNOSIS — Z125 Encounter for screening for malignant neoplasm of prostate: Secondary | ICD-10-CM

## 2010-11-19 LAB — PSA: PSA: 1.33 ng/mL (ref 0.10–4.00)

## 2010-11-19 LAB — COMPREHENSIVE METABOLIC PANEL
ALT: 44 U/L (ref 0–53)
AST: 34 U/L (ref 0–37)
Albumin: 3.9 g/dL (ref 3.5–5.2)
Alkaline Phosphatase: 52 U/L (ref 39–117)
BUN: 14 mg/dL (ref 6–23)
CO2: 28 mEq/L (ref 19–32)
Calcium: 8.7 mg/dL (ref 8.4–10.5)
Chloride: 103 mEq/L (ref 96–112)
Creatinine, Ser: 1 mg/dL (ref 0.4–1.5)
GFR: 82.3 mL/min (ref >60.00)
Glucose, Bld: 101 mg/dL — ABNORMAL HIGH (ref 70–99)
Potassium: 4 mEq/L (ref 3.5–5.1)
Sodium: 137 mEq/L (ref 135–145)
Total Bilirubin: 0.9 mg/dL (ref 0.3–1.2)
Total Protein: 6.3 g/dL (ref 6.0–8.3)

## 2010-11-19 LAB — CBC WITH DIFFERENTIAL/PLATELET
Basophils Absolute: 0 10*3/uL (ref 0.0–0.1)
Basophils Relative: 0.6 % (ref 0.0–3.0)
Eosinophils Absolute: 0 10*3/uL (ref 0.0–0.7)
Eosinophils Relative: 0.8 % (ref 0.0–5.0)
HCT: 42.1 % (ref 39.0–52.0)
Hemoglobin: 14.7 g/dL (ref 13.0–17.0)
Lymphocytes Relative: 34.4 % (ref 12.0–46.0)
Lymphs Abs: 1.2 10*3/uL (ref 0.7–4.0)
MCHC: 35 g/dL (ref 30.0–36.0)
MCV: 90.8 fl (ref 78.0–100.0)
Monocytes Absolute: 0.3 10*3/uL (ref 0.1–1.0)
Monocytes Relative: 7.8 % (ref 3.0–12.0)
Neutro Abs: 2 10*3/uL (ref 1.4–7.7)
Neutrophils Relative %: 56.4 % (ref 43.0–77.0)
Platelets: 176 10*3/uL (ref 150.0–400.0)
RBC: 4.64 Mil/uL (ref 4.22–5.81)
RDW: 14.2 % (ref 11.5–14.6)
WBC: 3.6 10*3/uL — ABNORMAL LOW (ref 4.5–10.5)

## 2010-11-19 LAB — LIPID PANEL
Cholesterol: 157 mg/dL (ref 0–200)
HDL: 46.6 mg/dL (ref >39.00)
LDL Cholesterol: 92 mg/dL (ref 0–99)
Total CHOL/HDL Ratio: 3
Triglycerides: 90 mg/dL (ref 0.0–149.0)
VLDL: 18 mg/dL (ref 0.0–40.0)

## 2010-11-19 LAB — TSH: TSH: 2.57 u[IU]/mL (ref 0.35–5.50)

## 2010-11-23 ENCOUNTER — Encounter: Payer: Self-pay | Admitting: Family Medicine

## 2010-11-23 ENCOUNTER — Ambulatory Visit (INDEPENDENT_AMBULATORY_CARE_PROVIDER_SITE_OTHER): Payer: BC Managed Care – PPO | Admitting: Family Medicine

## 2010-11-23 DIAGNOSIS — Z125 Encounter for screening for malignant neoplasm of prostate: Secondary | ICD-10-CM

## 2010-11-23 DIAGNOSIS — E785 Hyperlipidemia, unspecified: Secondary | ICD-10-CM

## 2010-11-23 DIAGNOSIS — Z1211 Encounter for screening for malignant neoplasm of colon: Secondary | ICD-10-CM

## 2010-11-23 DIAGNOSIS — Z0000 Encounter for general adult medical examination without abnormal findings: Secondary | ICD-10-CM

## 2010-11-23 DIAGNOSIS — F988 Other specified behavioral and emotional disorders with onset usually occurring in childhood and adolescence: Secondary | ICD-10-CM

## 2010-11-23 MED ORDER — PANTOPRAZOLE SODIUM 40 MG PO TBEC: 40.0000 mg | DELAYED_RELEASE_TABLET | Freq: Every day | ORAL | Status: DC

## 2010-11-23 MED ORDER — SILDENAFIL CITRATE 50 MG PO TABS: 50.0000 mg | ORAL_TABLET | ORAL | Status: DC | PRN

## 2010-11-23 MED ORDER — ROSUVASTATIN CALCIUM 40 MG PO TABS: 40.0000 mg | ORAL_TABLET | Freq: Every day | ORAL | Status: DC

## 2010-11-23 MED ORDER — METHYLPHENIDATE HCL 20 MG PO TBCR: 20.0000 mg | EXTENDED_RELEASE_TABLET | Freq: Every day | ORAL | Status: DC

## 2010-11-23 NOTE — Assessment & Plan Note (Signed)
Reviewed health habits including diet and exercise and skin cancer prevention Also reviewed health mt list, fam hx and immunizations  Rev wellness labs in detail No reason for fatigue found  Disc schedule and demanding work/ stress

## 2010-11-23 NOTE — Progress Notes (Signed)
Subjective:    Patient ID: Benjamin Bullock, male    DOB: 01-12-1951, 60 y.o.   MRN: [id number]  HPI Here for health mt exam  Is doing well overall   Wellness labs look pretty good   Cholesterol is up a little bit Is eating a bit more on the run  Eats a danish on the weekend - not every day  Also still not drinking coffee--- is contemplating decaf  No more diet coke and ice tea-- off caffiene  Lab Results  Component Value Date   CHOL 157 11/19/2010   CHOL 129 11/08/2009   CHOL 185 05/04/2009   Lab Results  Component Value Date   HDL 46.60 11/19/2010   HDL 40.98 11/08/2009   HDL 11.91 05/04/2009   Lab Results  Component Value Date   LDLCALC 92 11/19/2010   LDLCALC 63 11/08/2009   LDLCALC 115* 05/04/2009   Lab Results  Component Value Date   TRIG 90.0 11/19/2010   TRIG 79.0 11/08/2009   TRIG 93.0 05/04/2009   Lab Results  Component Value Date   CHOLHDL 3 11/19/2010   CHOLHDL 3 11/08/2009   CHOLHDL 4 05/04/2009   Lab Results  Component Value Date   LDLDIRECT 130.9 10/25/2008   LDLDIRECT 141.4 03/16/2007   LDLDIRECT 139.6 09/18/2006     Went to dermatologist and had froze area on his face- ? What it was  Also bx lesion on his abdomen- was negative  Derm is Dr Londell Moh   ADD- ritalin still works well for him - takes 1/2 a pill -- this is doing very well   Walks 5 days per week - briskly - but not as fast as he used to  Has multiple injuries from running so no more of that  Is thinking about increasing spin classes   Has been more tired lately  Labs are ok  No sleep apnea  Stress level is always very high   To trouble urinating  Nocturia times one --drinks a lot more water   Mood is not bad - doing well with that   Has not had a colonoscopy  Does not have a family history  Is not ready yet -- but may think about it   Past Medical History  Diagnosis Date  . Allergy     allergic rhinitis  . Anxiety   . Depression   . ADD (attention deficit disorder)   .  Hyperlipidemia   . Bursitis of hip, right   . History of meniscal tear     left knee meniscal tear   No past surgical history on file.  Allergies  Allergen Reactions  . Omeprazole     REACTION: not effective    History   Social History  . Marital Status: Married    Spouse Name: N/A    Number of Children: N/A  . Years of Education: N/A   Social History Main Topics  . Smoking status: Never Smoker   . Smokeless tobacco: None  . Alcohol Use: Yes  . Drug Use:   . Sexually Active:    Other Topics Concern  . None   Social History Narrative  . None         Review of Systems  Review of Systems  Constitutional: Negative for fever, appetite change, fatigue and unexpected weight change.  Eyes: Negative for pain and visual disturbance.  Respiratory: Negative for cough and shortness of breath.   Cardiovascular: Negative.   Gastrointestinal: Negative for nausea, diarrhea  and constipation.  Genitourinary: Negative for urgency and frequency.  Skin: Negative for pallor.  Neurological: Negative for weakness, light-headedness, numbness and headaches.  Hematological: Negative for adenopathy. Does not bruise/bleed easily.  Psychiatric/Behavioral: Negative for dysphoric mood. The patient is not nervous/anxious.          Objective:   Physical Exam  Constitutional: He appears well-developed and well-nourished.       Very mildly overweight and well appearing   HENT:  Head: Normocephalic and atraumatic.  Right Ear: External ear normal.  Left Ear: External ear normal.  Nose: Nose normal.  Mouth/Throat: Oropharynx is clear and moist.  Eyes: Conjunctivae and EOM are normal. Pupils are equal, round, and reactive to light.  Neck: Normal range of motion. Neck supple. No JVD present. Carotid bruit is not present. Erythema present. No thyromegaly present.  Cardiovascular: Normal rate and normal heart sounds.   No murmur heard. Pulmonary/Chest: Effort normal and breath sounds normal.  No respiratory distress. He has no wheezes.  Abdominal: Soft. Bowel sounds are normal. He exhibits no abdominal bruit and no mass. There is no tenderness.  Genitourinary: Rectum normal and prostate normal. Guaiac negative stool.  Musculoskeletal: Normal range of motion. He exhibits no edema and no tenderness.  Lymphadenopathy:    He has no cervical adenopathy.  Neurological: He is alert. He has normal reflexes. Coordination normal.  Skin: Skin is warm and dry. No rash noted. No erythema. No pallor.  Psychiatric:       Rapid pressured speech today- pt is somewhat hyperactive and jittery  Eye contact is fair           Assessment & Plan:

## 2010-11-23 NOTE — Assessment & Plan Note (Signed)
psa and DRE normal today No urinary symptoms

## 2010-11-23 NOTE — Assessment & Plan Note (Signed)
Up a bit but still well controlled with statin and diet  Rev labs in detail with pt Med refilled Rev low sat fat diet

## 2010-11-23 NOTE — Patient Instructions (Signed)
Please do stool card for colon cancer screening  Update me if you change your mind about a colonoscopy  If you are interested in shingles vaccine in future - call your insurance company to see how coverage is and call us to schedule  Keep doing the best you can with healthy diet and exercise Labs look ok

## 2010-11-23 NOTE — Assessment & Plan Note (Signed)
Doing well with ritain at 1/2 to 1 pill daily  No side eff Is productive refil given

## 2010-12-04 NOTE — Assessment & Plan Note (Signed)
[facility]                            CARDIOLOGY OFFICE NOTE   NAME:Benjamin Bullock                      MRN:          [id number]  DATE:09/15/2007                            DOB:          01-13-1951    [name] is a delightful 60 year old gentleman who comes today  referred by Dr. Roxy Bullock for family history of coronary disease and  however aggressive we should be with his cholesterol.   He is 60 years of age, married, and has one child.  I see him frequently  at the [facility] where he exercises.   Having no symptoms of angina or ischemic heart disease.  He is in pretty  good physical shape.   His only risk factors are hyperlipidemia.  He has been on Lipitor or for  about 7 or 8 years.  His total cholesterol was 186, HDL was 46.8,  triglycerides 82, LDL 123, LDL 16.  Cholesterol HDL ratio was 4.0.  A  recent hemoglobin A1c was normal.   He states his father had a premature heart attack but it was probably  well after the age of 62 years of age.  He died at 78 of heart attack.  His mother is alive.  There is no premature history of coronary disease.   Does not smoke, does not have diabetes.  He takes aspirin  intermittently.  He is on Lipitor or 20 mg a day.   PAST MEDICAL HISTORY:  No dye reaction.  His current meds are Lipitor or  20 mg q.h.s.  He takes aspirin intermittently.  He has no known drug  allergies.   He does drink alcohol socially.  He has caffeinated beverages about 4 to  6 cups a day.   His surgical history is negative.   FAMILY HISTORY:  As above.   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  He  is married and has one child who is grown and works in [employer].   REVIEW OF SYSTEMS:  Totally negative.  He denies specifically any  orthopnea, PND, peripheral edema, presyncope, syncope.   EXAM:  Today his blood pressure is 121/81, his pulse 68 and regular.  Weight is  175.  He is 5 feet 8 inches.  HEENT:  Normocephalic, atraumatic.  PERRL.  Extraocular is intact.  Sclerae are clear.  Face symmetry is normal.  Carotids are equal lateral bruits, no JVD.  Thyroid is not enlarged.  Trachea is midline.  LUNGS:  Clear.  HEART:  Reveals a regular rate and rhythm nondisplaced PMI.  Normal S1-  S2.  ABDOMEN:  Soft, good bowel sounds.  EXTREMITIES:  No cyanosis, clubbing, or edema.  Pulses are intact.  NEURO:  Exam is intact.   Had about a 20-minute plus discussion with Benjamin Bullock.  I have gone over  the different pros and cons and epidemiological data, not to mention ACC  guidelines for hyperlipidemia and risk of coronary disease.  I have  advised him that I would like to see his LDL as low as possible,  certainly  below 100.  He agrees to this after a long discussion.  We  have also spent a lot of time talking about symptomatic obstructive  coronary disease presents including exertional angina as well as an  acute coronary syndrome and how to respond to such.   PLAN:  1. Change Lipitor or to Crestor 40 mg daily.  Even if I went to 80 of      Lipitor or do not think I getting below 100.  Side effect profile      also may be quite significantly increased.  If this does not work      we can certainly try Vytorin 10/40.  2. Enteric-coated aspirin 81 mg at least every other day, preferably      every day.  3. Regular exercise.  4. Follow-up lipids and LFTs in 6 weeks.     Benjamin C. Daleen Squibb, MD, [facility]  Electronically Signed    TCW/MedQ  DD: 09/15/2007  DT: 09/16/2007  Job #: [id number]   cc:   Benjamin A. Milinda Antis, MD
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
## 2011-01-15 ENCOUNTER — Other Ambulatory Visit: Payer: BC Managed Care – PPO

## 2011-01-28 ENCOUNTER — Telehealth: Payer: Self-pay | Admitting: Radiology

## 2011-01-28 NOTE — Telephone Encounter (Signed)
FYI [facility] cancelled ifob, no sample received. 

## 2011-01-28 NOTE — Telephone Encounter (Signed)
Please find out from pt if he is going to do the ifob-thanks (or if there was other issue with it

## 2011-01-28 NOTE — Telephone Encounter (Signed)
Pt said he had been busy but he would do the ifob and go ahead and send it in. Terri said it was OK to use the kit that he has but needs to go ahead and send it in or he will receive a charge for the kit. Pt said OK he appreciated our follow up and would send in test.

## 2011-02-05 ENCOUNTER — Encounter: Payer: Self-pay | Admitting: Family Medicine

## 2011-02-05 ENCOUNTER — Ambulatory Visit (INDEPENDENT_AMBULATORY_CARE_PROVIDER_SITE_OTHER): Payer: BC Managed Care – PPO | Admitting: Family Medicine

## 2011-02-05 DIAGNOSIS — M549 Dorsalgia, unspecified: Secondary | ICD-10-CM

## 2011-02-05 MED ORDER — CYCLOBENZAPRINE HCL 10 MG PO TABS: 5.0000 mg | ORAL_TABLET | Freq: Three times a day (TID) | ORAL | Status: AC | PRN

## 2011-02-05 NOTE — Progress Notes (Signed)
Back "tightens up" walking.  He walked 3 days in a row playing golf.  Spasm then started when bending over in the shower.  Some improvement recently, ie today.  Pain is on L lower side of back.  No FCNAVD.  No weakness.  No trauma.  Feeling well o/w.  No radicular sx .  Meds, vitals, and allergies reviewed.   ROS: See HPI.  Otherwise, noncontributory.  nad Normal S/S in ble ext Back w/o midline pain. Left Lower L spine paraspinal muscle tenderness, near the L SI joint, but no pain on SI testing No rash

## 2011-02-05 NOTE — Assessment & Plan Note (Signed)
Likely muscle spasm.  D/w pt about heat, stretching, flexeril with sedation caution, nsaids with GI caution and f/u prn.  Benign exam o/w. He understood.

## 2011-02-05 NOTE — Patient Instructions (Signed)
Take 1/2 to 1 tab of flexeril up to 3 times a day for muscle tightness.  It can make you drowsy.  Use a heating pad and gently stretch your back.  This should gradually improve.  You can take up to 400mg  of ibuprofen 3 times a day with food for pain.  You can take tylenol with the ibuprofen if needed.

## 2011-03-01 ENCOUNTER — Other Ambulatory Visit: Payer: Self-pay

## 2011-03-01 NOTE — Telephone Encounter (Signed)
Benjamin Bullock called and request refill rx for Ritalin SR 20mg . Pt is aware Dr Milinda Antis is out of the office until Monday. Please call pt when prescription is ready for pick up.

## 2011-03-04 MED ORDER — METHYLPHENIDATE HCL 20 MG PO TBCR: 20.0000 mg | EXTENDED_RELEASE_TABLET | Freq: Every day | ORAL | Status: DC

## 2011-03-04 NOTE — Telephone Encounter (Signed)
Px printed for pick up in IN box  

## 2011-03-04 NOTE — Telephone Encounter (Signed)
Patient notified as instructed by telephone. Prescription left at front desk.  

## 2011-04-04 ENCOUNTER — Telehealth: Payer: Self-pay | Admitting: *Deleted

## 2011-04-04 NOTE — Telephone Encounter (Signed)
Pt has a new grandbaby on the way and would like to get a tdap.  Ok to schedule?

## 2011-04-04 NOTE — Telephone Encounter (Signed)
Yes, that is fine. 

## 2011-04-04 NOTE — Telephone Encounter (Signed)
Will send to Jacki Cones to schedule nurse visit.

## 2011-04-10 NOTE — Telephone Encounter (Signed)
Left message on wife's voice mail advising her to call back to schedule nurse visit for vaccine.

## 2011-04-17 ENCOUNTER — Ambulatory Visit (INDEPENDENT_AMBULATORY_CARE_PROVIDER_SITE_OTHER): Payer: BC Managed Care – PPO | Admitting: Family Medicine

## 2011-04-17 DIAGNOSIS — Z23 Encounter for immunization: Secondary | ICD-10-CM

## 2011-04-18 NOTE — Progress Notes (Signed)
  Subjective:    Patient ID: Benjamin Bullock, male    DOB: Oct 16, 1950, 60 y.o.   MRN: [id number]  HPI  Here for vaccine only   Review of Systems     Objective:   Physical Exam        Assessment & Plan:

## 2011-05-01 ENCOUNTER — Ambulatory Visit: Payer: BC Managed Care – PPO

## 2011-05-24 ENCOUNTER — Other Ambulatory Visit: Payer: Self-pay | Admitting: *Deleted

## 2011-05-24 MED ORDER — PANTOPRAZOLE SODIUM 40 MG PO TBEC: 40.0000 mg | DELAYED_RELEASE_TABLET | Freq: Every day | ORAL | Status: DC

## 2011-06-04 ENCOUNTER — Telehealth: Payer: Self-pay | Admitting: Family Medicine

## 2011-06-04 MED ORDER — METHYLPHENIDATE HCL 20 MG PO TBCR: 20.0000 mg | EXTENDED_RELEASE_TABLET | Freq: Every day | ORAL | Status: DC

## 2011-06-04 NOTE — Telephone Encounter (Signed)
Px printed for pick up in IN box  

## 2011-06-04 NOTE — Telephone Encounter (Signed)
Requesting refill for Ritalin 20 mg.  Patient call back is [phone].

## 2011-06-05 ENCOUNTER — Other Ambulatory Visit: Payer: Self-pay | Admitting: *Deleted

## 2011-06-05 NOTE — Telephone Encounter (Signed)
Patient notified as instructed by telephone. Prescription left at front desk.  

## 2011-06-05 NOTE — Telephone Encounter (Signed)
?   I think I just did this yesterday?

## 2011-06-05 NOTE — Telephone Encounter (Signed)
Please call patient when ready. 

## 2011-07-05 ENCOUNTER — Ambulatory Visit (INDEPENDENT_AMBULATORY_CARE_PROVIDER_SITE_OTHER): Payer: BC Managed Care – PPO | Admitting: Family Medicine

## 2011-07-05 ENCOUNTER — Encounter: Payer: Self-pay | Admitting: Family Medicine

## 2011-07-05 VITALS — BP 110/72 | HR 84 | Temp 98.8°F | Ht 67.0 in | Wt 184.4 lb

## 2011-07-05 DIAGNOSIS — J111 Influenza due to unidentified influenza virus with other respiratory manifestations: Secondary | ICD-10-CM

## 2011-07-05 MED ORDER — HYDROCODONE-HOMATROPINE 5-1.5 MG/5ML PO SYRP: ORAL_SOLUTION | ORAL | Status: DC

## 2011-07-05 MED ORDER — OSELTAMIVIR PHOSPHATE 75 MG PO CAPS: 75.0000 mg | ORAL_CAPSULE | Freq: Two times a day (BID) | ORAL | Status: AC

## 2011-07-05 NOTE — Progress Notes (Signed)
Patient Name: Benjamin Bullock Date of Birth: October 23, 1950 Medical Record Number: [id number] Gender: male  History of Present Illness:  Benjamin Bullock presents with runny nose, sneezing, cough, sore throat, malaise, myalgias, arthralgia, chills, and fever.  The patent denies sore throat as the primary complaint. Denies sthortness of breath/wheezing, otalgia, facial pain, abdominal pain, changes in bowel or bladder.  Generally feels terrible Been sick for 2 days. Diffuse achiness - muscle aches. Felt really bad, flushed. Coughing a lot, difficulty bringing things up. Felt feverish to wife. Had some chills.   No nauseousness of GI upset.  Mainly feels like he ran a marathon.   Tmax: subj  PMH, PHS, Allergies, Problem List, Medications, Family History, and Social History have all been reviewed.  Review of Systems: as above, eating and drinking - tolerating PO. Urinating normally. No excessive vomitting or diarrhea. O/w as above.  Physical Exam:  Filed Vitals:   07/05/11 0830  BP: 110/72  Pulse: 84  Temp: 98.8 F (37.1 C)  TempSrc: Oral  Height: 5\' 7"  (1.702 m)  Weight: 184 lb 6.4 oz (83.643 kg)  SpO2: 98%    Gen: WDWN, NAD; A & O x3, cooperative. Pleasant.Globally Non-toxic HEENT: Normocephalic and atraumatic. Throat clear, w/o exudate, R TM clear, L TM - good landmarks, No fluid present. rhinnorhea. No frontal or maxillary sinus T. MMM NECK: Anterior cervical  LAD is absent CV: RRR, No M/G/R, cap refill <2 sec PULM: Breathing comfortably in no respiratory distress. no wheezing, crackles, rhonchi ABD: S,NT,ND,+BS. No HSM. No rebound. EXT: No c/c/e PSYCH: Friendly, good eye contact MSK: Nml gait  A/P: 1. Influenza: The patient's clinical exam and history is consistent with a diagnosis of influenza. Tamiflu, hycodan  I discussed with the patient that the CDC and [place] Infectious disease personel are recommended that we not do the rapid influenza screening test on  people that have a clinical history and exam consistent with the diagnosis.  Supportive care. Fluids. Cough medicines as needed  Anti-pyretics. Detailed in p/i  Infection control emphasized, including OOW or school until AF 24 hours.

## 2011-07-15 ENCOUNTER — Other Ambulatory Visit: Payer: Self-pay | Admitting: *Deleted

## 2011-07-15 ENCOUNTER — Other Ambulatory Visit: Payer: Self-pay | Admitting: Internal Medicine

## 2011-07-15 MED ORDER — HYDROCODONE-HOMATROPINE 5-1.5 MG/5ML PO SYRP: ORAL_SOLUTION | ORAL | Status: DC

## 2011-07-15 MED ORDER — HYDROCODONE-HOMATROPINE 5-1.5 MG/5ML PO SYRP: ORAL_SOLUTION | ORAL | Status: AC

## 2011-07-15 NOTE — Telephone Encounter (Signed)
yes

## 2011-07-15 NOTE — Telephone Encounter (Signed)
Faxed request from pharmacy. OK to refill?

## 2011-07-15 NOTE — Telephone Encounter (Signed)
Medication phoned to [facility] pharmacy as instructed.

## 2011-07-15 NOTE — Telephone Encounter (Signed)
Pt just had flu  Can refil times one Px written for call in

## 2011-10-02 ENCOUNTER — Other Ambulatory Visit: Payer: Self-pay | Admitting: *Deleted

## 2011-10-02 MED ORDER — METHYLPHENIDATE HCL ER 20 MG PO TBCR: 20.0000 mg | EXTENDED_RELEASE_TABLET | ORAL | Status: DC

## 2011-10-02 NOTE — Telephone Encounter (Signed)
I don't know why- but when I try to write for ritalin sr or ER it gives brand name metadate --- let them know it is the same thing and let me know if any questions or problems

## 2011-10-02 NOTE — Telephone Encounter (Signed)
Px printed for pick up in IN box  

## 2011-10-02 NOTE — Telephone Encounter (Signed)
Patient called for a refill on his Ritalin. Please let patient know when it is ready for pickup.

## 2011-10-02 NOTE — Telephone Encounter (Signed)
Patient notified as instructed by telephone. Prescription left at front desk.  

## 2011-10-17 ENCOUNTER — Other Ambulatory Visit: Payer: Self-pay | Admitting: *Deleted

## 2011-10-21 ENCOUNTER — Other Ambulatory Visit: Payer: Self-pay

## 2011-10-21 NOTE — Telephone Encounter (Signed)
Done and in nurse IN box now

## 2011-10-21 NOTE — Telephone Encounter (Signed)
PA form faxed to [facility] at [phone].  Will wait for approval denial.

## 2011-10-21 NOTE — Telephone Encounter (Signed)
Was this just done 2 weeks ago?

## 2011-10-21 NOTE — Telephone Encounter (Signed)
Spoke with the pharmacist at [facility] and he stated that Ritalin needs PA and gave the number [phone].  Will call to get PA form faxed to our office.

## 2011-10-21 NOTE — Telephone Encounter (Signed)
Pt said his Ritalin rx was dropped off at [facility] and pharmacy had a question that needed to be addressed before could fill med and "[facility] was to fax needed info to Dr Lucretia Roers office. Pt wants call back at [phone] that our office has spoken with [facility] on [address].

## 2011-10-21 NOTE — Telephone Encounter (Signed)
PA form requested via fax from [facility], form in your IN box.

## 2011-10-21 NOTE — Telephone Encounter (Signed)
Rx was already taken to pharmacy, awaiting PA.

## 2011-10-22 NOTE — Telephone Encounter (Signed)
Received PA approval for Ritalin.  Approved from 10/21/2011-10/20/2012, patient and pharmacy notified.  PA form sent to be scanned.

## 2011-11-17 ENCOUNTER — Telehealth: Payer: Self-pay | Admitting: Family Medicine

## 2011-11-17 DIAGNOSIS — Z125 Encounter for screening for malignant neoplasm of prostate: Secondary | ICD-10-CM

## 2011-11-17 DIAGNOSIS — E785 Hyperlipidemia, unspecified: Secondary | ICD-10-CM

## 2011-11-17 DIAGNOSIS — Z0000 Encounter for general adult medical examination without abnormal findings: Secondary | ICD-10-CM

## 2011-11-17 NOTE — Telephone Encounter (Signed)
Message copied by Judy Pimple on Sun Nov 17, 2011  8:11 PM ------      Message from: Alvina Chou      Created: Thu Nov 14, 2011 12:43 PM      Regarding: labs for 4 -30       Patient is scheduled for CPX labs, please order future labs, Thanks , Camelia Eng

## 2011-11-19 ENCOUNTER — Other Ambulatory Visit: Payer: BC Managed Care – PPO

## 2011-11-25 ENCOUNTER — Encounter: Payer: BC Managed Care – PPO | Admitting: Family Medicine

## 2011-11-25 DIAGNOSIS — Z0289 Encounter for other administrative examinations: Secondary | ICD-10-CM

## 2011-12-13 ENCOUNTER — Other Ambulatory Visit: Payer: Self-pay | Admitting: *Deleted

## 2011-12-13 MED ORDER — PANTOPRAZOLE SODIUM 40 MG PO TBEC: 40.0000 mg | DELAYED_RELEASE_TABLET | Freq: Every day | ORAL | Status: DC

## 2011-12-24 ENCOUNTER — Other Ambulatory Visit: Payer: Self-pay | Admitting: Family Medicine

## 2011-12-25 ENCOUNTER — Other Ambulatory Visit: Payer: Self-pay | Admitting: Family Medicine

## 2011-12-25 ENCOUNTER — Telehealth: Payer: Self-pay | Admitting: Family Medicine

## 2011-12-25 MED ORDER — SILDENAFIL CITRATE 50 MG PO TABS: 50.0000 mg | ORAL_TABLET | ORAL | Status: DC | PRN

## 2011-12-25 NOTE — Telephone Encounter (Signed)
Done

## 2011-12-25 NOTE — Telephone Encounter (Signed)
Denied-refill request too soon; Last Rx fill 05.04.12 #[id number]

## 2011-12-25 NOTE — Telephone Encounter (Signed)
[name] would like a refill on Viagra. Pls call when this is done.

## 2011-12-25 NOTE — Telephone Encounter (Signed)
Px written for call in   Did not tell me which pharmacy

## 2011-12-26 MED ORDER — PANTOPRAZOLE SODIUM 40 MG PO TBEC: 40.0000 mg | DELAYED_RELEASE_TABLET | Freq: Every day | ORAL | Status: DC

## 2011-12-26 MED ORDER — SILDENAFIL CITRATE 50 MG PO TABS: 50.0000 mg | ORAL_TABLET | ORAL | Status: DC | PRN

## 2011-12-26 NOTE — Telephone Encounter (Signed)
Rx[s] to pharmacy; pt states he missed OV in May 2013 but did not receive call regarding appointment and was unaware, "would not no show" and has scheduled new appt for CPE in 09.2013; this is why needing Rx, also request Protonix to last until physical OV/SLS Done.

## 2012-01-28 ENCOUNTER — Telehealth: Payer: Self-pay

## 2012-01-28 NOTE — Telephone Encounter (Signed)
Pt has lower left back pain; hurts worse when sits down; has been lifting 22 lb grandchild. Pain level now 5. Ice seems to help. Pt has been seeing chiropractor who suggested Aleve (9 pills for 3 days then 6 pills for 3 days) Pt took 2 Aleve 01/27/12 and face and lips swollen with hives; pt took Benadryl and that helped; no difficulty breathing. Pt has stopped aleve.Pt uses [facility] at [address]. Pt scheduled appt 01/29/12 at 8:30am. If pt condition changes or worsens pt will call back or go to UC if needed.

## 2012-01-28 NOTE — Telephone Encounter (Signed)
Aware, will see him then °

## 2012-01-29 ENCOUNTER — Encounter: Payer: Self-pay | Admitting: Family Medicine

## 2012-01-29 ENCOUNTER — Ambulatory Visit (INDEPENDENT_AMBULATORY_CARE_PROVIDER_SITE_OTHER): Payer: BC Managed Care – PPO | Admitting: Family Medicine

## 2012-01-29 VITALS — BP 136/79 | HR 75 | Temp 98.5°F | Ht 67.0 in | Wt 189.2 lb

## 2012-01-29 DIAGNOSIS — M549 Dorsalgia, unspecified: Secondary | ICD-10-CM

## 2012-01-29 DIAGNOSIS — Z888 Allergy status to other drugs, medicaments and biological substances status: Secondary | ICD-10-CM

## 2012-01-29 DIAGNOSIS — T7840XA Allergy, unspecified, initial encounter: Secondary | ICD-10-CM | POA: Insufficient documentation

## 2012-01-29 MED ORDER — METHYLPHENIDATE HCL ER 20 MG PO TBCR: 20.0000 mg | EXTENDED_RELEASE_TABLET | ORAL | Status: DC

## 2012-01-29 MED ORDER — PREDNISONE 10 MG PO TABS
ORAL_TABLET | ORAL | Status: DC
Start: 2012-01-29 — End: 2013-05-17

## 2012-01-29 NOTE — Assessment & Plan Note (Addendum)
From aleve (was also taking too much)  Angioedema without resp compromise  Disc absolute avoidance of any nsaid incl asa or ibuprofen from now on - and pt expresses understanding  Adv to update if any symptoms return whatsoever

## 2012-01-29 NOTE — Assessment & Plan Note (Addendum)
Recurrent after lifting  L low back without neurol symptoms chirop adj is helping  Allergic to aleve   Will give pred taper  Also PT ref  No more aleve or otc nsaids due to all rxn at this time If no further imp consider films Pt also has flexeril for pm use- was not using

## 2012-01-29 NOTE — Progress Notes (Signed)
Subjective:    Patient ID: Benjamin Bullock, male    DOB: 1950/08/07, 61 y.o.   MRN: [id number]  HPI Here for back pain and then allergic rxn  End of June - started having back pain (has been lifting his new grandson) Exacerbated old problem Went to a chiropractor he knows  He px naproxen - otc aleve 3 with each meal for 3 days and then 2 and then 1   He tapered off and started feeling better  Then started some moderate walking  Then Sunday- back got sore again  Monday took 2 aleve -then had a reaction -- lips swelled and face turned red, then benadryl helped  Took a day when lips swelled  Got some hives on arms- itchy  No wheeze or trouble breathing   Usually prednisone knocks out his back pain  Today back pain/ spasms are improved  Chiropractor using tens unit and ice , and adjustments   Right now sitting is painful , getting up and walking helps  No rad to leg Is L low back  Positional - getting in the car is worst   Patient Active Problem List  Diagnosis  . HYPERLIPIDEMIA  . ANXIETY STATE NOS  . ERECTILE DYSFUNCTION  . DEPRESSION  . ADD  . ALLERGIC RHINITIS  . GERD  . Routine general medical examination at a health care facility  . Prostate cancer screening  . Back pain   Past Medical History  Diagnosis Date  . Allergy     allergic rhinitis  . Anxiety   . Depression   . ADD (attention deficit disorder)   . Hyperlipidemia   . Bursitis of hip, right   . History of meniscal tear     left knee meniscal tear   No past surgical history on file. History  Substance Use Topics  . Smoking status: Former Games developer  . Smokeless tobacco: Not on file   Comment: Smoked for about one year 30 years ago  . Alcohol Use: Yes   Family History  Problem Relation Age of Onset  . Hypertension Mother   . Depression Mother   . Heart disease Father     CAD  . Heart disease Paternal Uncle     CAD  . COPD Maternal Grandfather    Allergies  Allergen Reactions  . Aleve  (Naproxen Sodium) Swelling    Face and lips swollen and hives  . Omeprazole     REACTION: not effective   Current Outpatient Prescriptions on File Prior to Visit  Medication Sig Dispense Refill  . CRESTOR 40 MG tablet TAKE 1 TABLET EVERY DAY  30 tablet  3  . pantoprazole (PROTONIX) 40 MG tablet Take 1 tablet (40 mg total) by mouth daily. 1 tablet by mouth each am about 30 minutes before breakfast.  30 tablet  5  . cyclobenzaprine (FLEXERIL) 10 MG tablet Take 0.5-1 tablets (5-10 mg total) by mouth every 8 (eight) hours as needed for muscle spasms (sedation caution).  30 tablet  1  . methylphenidate (METADATE ER) 20 MG ER tablet Take 1 tablet (20 mg total) by mouth every morning.  30 tablet  0  . sildenafil (VIAGRA) 50 MG tablet Take 1 tablet (50 mg total) by mouth as needed for erectile dysfunction. 1 tablet by mouth as directed as needed before sexual activity.  9 tablet  5      Review of Systems Review of Systems  Constitutional: Negative for fever, appetite change, fatigue and unexpected weight  change.  Eyes: Negative for pain and visual disturbance.  Respiratory: Negative for cough and shortness of breath.   Cardiovascular: Negative for cp or palpitations    Gastrointestinal: Negative for nausea, diarrhea and constipation.  Genitourinary: Negative for urgency and frequency.  Skin: Negative for pallor or rash  (angioedema resolved) MSK pos for R sided low back pain that is positional and motion related, neg for swollen or tender joints  Neurological: Negative for weakness, light-headedness, numbness and headaches.  Hematological: Negative for adenopathy. Does not bruise/bleed easily.  Psychiatric/Behavioral: Negative for dysphoric mood. The patient is not nervous/anxious. ADD is controlled by his medicine-ritalin, which he needs a refil of today        Objective:   Physical Exam  Constitutional: He appears well-developed and well-nourished. No distress.  HENT:  Head:  Normocephalic and atraumatic.  Eyes: Conjunctivae and EOM are normal. Pupils are equal, round, and reactive to light. No scleral icterus.  Neck: Normal range of motion. Neck supple. No JVD present. Carotid bruit is not present. No thyromegaly present.  Cardiovascular: Normal rate, regular rhythm, normal heart sounds and intact distal pulses.  Exam reveals no gallop.   Pulmonary/Chest: Effort normal and breath sounds normal. No respiratory distress. He has no wheezes.  Abdominal: Soft. Bowel sounds are normal. He exhibits no distension. There is no tenderness.  Musculoskeletal: He exhibits tenderness. He exhibits no edema.       Lumbar back: He exhibits decreased range of motion, tenderness and spasm. He exhibits no bony tenderness, no swelling, no edema, no deformity, no laceration and normal pulse.       Tight peri lumbar musculature over R low back with palpable spasm Nl SLR Nl rom hip  LS flex 90 deg and ext 10-20 deg Pain to flex L  Nl gait   Lymphadenopathy:    He has no cervical adenopathy.  Neurological: He is alert. He has normal reflexes. He displays no atrophy. No cranial nerve deficit or sensory deficit. He exhibits normal muscle tone. Coordination normal.  Skin: Skin is warm and dry. No rash noted. No erythema. No pallor.  Psychiatric: He has a normal mood and affect.          Assessment & Plan:

## 2012-01-29 NOTE — Patient Instructions (Signed)
No more aleve - or anti inflammatory -- no advil/ ibuprofen/ aspirin Tylenol is ok Take prednisone as directed We will do PT ref at check out  Update me if any symptoms come back with allergic reaction

## 2012-02-06 ENCOUNTER — Ambulatory Visit: Payer: BC Managed Care – PPO | Attending: Family Medicine | Admitting: Rehabilitative and Restorative Service Providers"

## 2012-02-06 DIAGNOSIS — M545 Low back pain, unspecified: Secondary | ICD-10-CM | POA: Insufficient documentation

## 2012-02-06 DIAGNOSIS — IMO0001 Reserved for inherently not codable concepts without codable children: Secondary | ICD-10-CM | POA: Insufficient documentation

## 2012-02-08 ENCOUNTER — Other Ambulatory Visit: Payer: Self-pay | Admitting: Family Medicine

## 2012-02-18 ENCOUNTER — Other Ambulatory Visit: Payer: Self-pay | Admitting: Family Medicine

## 2012-02-21 ENCOUNTER — Telehealth: Payer: Self-pay | Admitting: Family Medicine

## 2012-02-21 NOTE — Telephone Encounter (Signed)
Patient has questions about taking baby aspirin.  He is allergic to Aleve and doesn't know if he can take baby aspirin.  He had heard it is good for your heart.

## 2012-02-23 NOTE — Telephone Encounter (Signed)
I do not recommend he takes it - please tell him not to  The aleve caused a classic allergic reaction and that is worrisome

## 2012-02-24 NOTE — Telephone Encounter (Signed)
Patient notified as instructed by telephone. 

## 2012-03-27 ENCOUNTER — Other Ambulatory Visit (INDEPENDENT_AMBULATORY_CARE_PROVIDER_SITE_OTHER): Payer: BC Managed Care – PPO

## 2012-03-27 DIAGNOSIS — Z125 Encounter for screening for malignant neoplasm of prostate: Secondary | ICD-10-CM

## 2012-03-27 DIAGNOSIS — E785 Hyperlipidemia, unspecified: Secondary | ICD-10-CM

## 2012-03-27 DIAGNOSIS — Z0000 Encounter for general adult medical examination without abnormal findings: Secondary | ICD-10-CM

## 2012-03-27 LAB — CBC WITH DIFFERENTIAL/PLATELET
Basophils Absolute: 0 10*3/uL (ref 0.0–0.1)
Basophils Relative: 0.9 % (ref 0.0–3.0)
Eosinophils Absolute: 0.1 10*3/uL (ref 0.0–0.7)
Eosinophils Relative: 1.6 % (ref 0.0–5.0)
HCT: 43.7 % (ref 39.0–52.0)
Hemoglobin: 14.4 g/dL (ref 13.0–17.0)
Lymphocytes Relative: 32.4 % (ref 12.0–46.0)
Lymphs Abs: 1.3 10*3/uL (ref 0.7–4.0)
MCHC: 33 g/dL (ref 30.0–36.0)
MCV: 92.1 fl (ref 78.0–100.0)
Monocytes Absolute: 0.4 10*3/uL (ref 0.1–1.0)
Monocytes Relative: 8.8 % (ref 3.0–12.0)
Neutro Abs: 2.3 10*3/uL (ref 1.4–7.7)
Neutrophils Relative %: 56.3 % (ref 43.0–77.0)
Platelets: 203 10*3/uL (ref 150.0–400.0)
RBC: 4.74 Mil/uL (ref 4.22–5.81)
RDW: 13.9 % (ref 11.5–14.6)
WBC: 4.2 10*3/uL — ABNORMAL LOW (ref 4.5–10.5)

## 2012-03-27 LAB — LIPID PANEL
Cholesterol: 175 mg/dL (ref 0–200)
HDL: 48.7 mg/dL (ref >39.00)
LDL Cholesterol: 97 mg/dL (ref 0–99)
Total CHOL/HDL Ratio: 4
Triglycerides: 149 mg/dL (ref 0.0–149.0)
VLDL: 29.8 mg/dL (ref 0.0–40.0)

## 2012-03-27 LAB — PSA: PSA: 1.29 ng/mL (ref 0.10–4.00)

## 2012-03-27 LAB — COMPREHENSIVE METABOLIC PANEL
ALT: 45 U/L (ref 0–53)
AST: 32 U/L (ref 0–37)
Albumin: 4.2 g/dL (ref 3.5–5.2)
Alkaline Phosphatase: 52 U/L (ref 39–117)
BUN: 13 mg/dL (ref 6–23)
CO2: 26 mEq/L (ref 19–32)
Calcium: 9.1 mg/dL (ref 8.4–10.5)
Chloride: 105 mEq/L (ref 96–112)
Creatinine, Ser: 0.9 mg/dL (ref 0.4–1.5)
GFR: 94.01 mL/min (ref >60.00)
Glucose, Bld: 104 mg/dL — ABNORMAL HIGH (ref 70–99)
Potassium: 4.5 mEq/L (ref 3.5–5.1)
Sodium: 140 mEq/L (ref 135–145)
Total Bilirubin: 0.9 mg/dL (ref 0.3–1.2)
Total Protein: 6.6 g/dL (ref 6.0–8.3)

## 2012-03-27 LAB — TSH: TSH: 2.33 u[IU]/mL (ref 0.35–5.50)

## 2012-03-31 ENCOUNTER — Encounter: Payer: Self-pay | Admitting: Family Medicine

## 2012-03-31 ENCOUNTER — Ambulatory Visit (INDEPENDENT_AMBULATORY_CARE_PROVIDER_SITE_OTHER): Payer: BC Managed Care – PPO | Admitting: Family Medicine

## 2012-03-31 VITALS — BP 130/82 | HR 74 | Temp 97.3°F | Ht 68.0 in | Wt 189.2 lb

## 2012-03-31 DIAGNOSIS — E785 Hyperlipidemia, unspecified: Secondary | ICD-10-CM

## 2012-03-31 DIAGNOSIS — Z125 Encounter for screening for malignant neoplasm of prostate: Secondary | ICD-10-CM

## 2012-03-31 DIAGNOSIS — Z1211 Encounter for screening for malignant neoplasm of colon: Secondary | ICD-10-CM | POA: Insufficient documentation

## 2012-03-31 DIAGNOSIS — Z0000 Encounter for general adult medical examination without abnormal findings: Secondary | ICD-10-CM

## 2012-03-31 DIAGNOSIS — Z23 Encounter for immunization: Secondary | ICD-10-CM

## 2012-03-31 DIAGNOSIS — K219 Gastro-esophageal reflux disease without esophagitis: Secondary | ICD-10-CM

## 2012-03-31 DIAGNOSIS — F988 Other specified behavioral and emotional disorders with onset usually occurring in childhood and adolescence: Secondary | ICD-10-CM

## 2012-03-31 NOTE — Progress Notes (Signed)
Subjective:    Patient ID: Benjamin Bullock, male    DOB: [birth year], 61 y.o.   MRN: [id number]  HPI Here for health maintenance exam and to review chronic medical problems    Still lots of stress Is anxious to disc cholesterol  Is on protonix for his GERD/ gastritis issues  He is no longer drinking a lot of caffeine - much better , is pretty much caffeine free  Has a lot of stress, however  Also reducing the foods that bothered him  Symptoms are very well controlled right now   Is getting more sleep  No plans for retirement    Wt is stable with bmi of 28  Diet-- is eating a healthy diet for the most part - occ potato chips  Exercise- is walking a lot with his son and grandson , and keeps up a good pace - but could do more  More stretches    Glucose fasting 104    Chemistry      Component Value Date/Time   NA 140 03/27/2012 0827   K 4.5 03/27/2012 0827   CL 105 03/27/2012 0827   CO2 26 03/27/2012 0827   BUN 13 03/27/2012 0827   CREATININE 0.9 03/27/2012 0827      Component Value Date/Time   CALCIUM 9.1 03/27/2012 0827   ALKPHOS 52 03/27/2012 0827   AST 32 03/27/2012 0827   ALT 45 03/27/2012 0827   BILITOT 0.9 03/27/2012 0827       Lab Results  Component Value Date   PSA 1.29 03/27/2012   PSA 1.33 11/19/2010   PSA 1.79 11/08/2009   This is stable  No prostate problems or trouble urinating  Pt is indifferent about prostate exam- no fam hx or other problems   Colon cancer screening - has not had a colonoscopy  This is a huge hassle  Is not ready for a colonosc- will do IFOB  Zoster status- has not contacted insurance  Is interested in the shot   Flu shot -will do that today   He does drink 2 glasses of wine at night   Lipids Lab Results  Component Value Date   CHOL 175 03/27/2012   CHOL 157 11/19/2010   CHOL 129 11/08/2009   Lab Results  Component Value Date   HDL 48.70 03/27/2012   HDL 46.60 11/19/2010   HDL 40.98 11/08/2009   Lab Results  Component Value Date   LDLCALC  97 03/27/2012   LDLCALC 92 11/19/2010   LDLCALC 63 11/08/2009   Lab Results  Component Value Date   TRIG 149.0 03/27/2012   TRIG 90.0 11/19/2010   TRIG 79.0 11/08/2009   Lab Results  Component Value Date   CHOLHDL 4 03/27/2012   CHOLHDL 3 11/19/2010   CHOLHDL 3 11/08/2009   Lab Results  Component Value Date   LDLDIRECT 130.9 10/25/2008   LDLDIRECT 141.4 03/16/2007   LDLDIRECT 139.6 09/18/2006    Cholesterol is in control at this time - on crestor at this time  Also low sat fat diet   Patient Active Problem List  Diagnosis  . HYPERLIPIDEMIA  . ANXIETY STATE NOS  . ERECTILE DYSFUNCTION  . DEPRESSION  . ADD  . ALLERGIC RHINITIS  . GERD  . Routine general medical examination at a health care facility  . Prostate cancer screening  . Back pain  . Allergic drug reaction   Past Medical History  Diagnosis Date  . Allergy     allergic rhinitis  .  Anxiety   . Depression   . ADD (attention deficit disorder)   . Hyperlipidemia   . Bursitis of hip, right   . History of meniscal tear     left knee meniscal tear   No past surgical history on file. History  Substance Use Topics  . Smoking status: Former Games developer  . Smokeless tobacco: Not on file   Comment: Smoked for about one year 30 years ago  . Alcohol Use: Yes   Family History  Problem Relation Age of Onset  . Hypertension Mother   . Depression Mother   . Heart disease Father     CAD  . Heart disease Paternal Uncle     CAD  . COPD Maternal Grandfather    Allergies  Allergen Reactions  . Aleve (Naproxen Sodium) Swelling    Face and lips swollen and hives  . Omeprazole     REACTION: not effective   Current Outpatient Prescriptions on File Prior to Visit  Medication Sig Dispense Refill  . CRESTOR 40 MG tablet TAKE 1 TABLET EVERY DAY  30 tablet  3  . methylphenidate (METADATE ER) 20 MG ER tablet Take 1 tablet (20 mg total) by mouth every morning.  30 tablet  0  . pantoprazole (PROTONIX) 40 MG tablet Take 1 tablet (40 mg  total) by mouth daily. 1 tablet by mouth each am about 30 minutes before breakfast.  30 tablet  5  . pantoprazole (PROTONIX) 40 MG tablet TAKE 1 TABLET BY MOUTH EVERY MORNING ABOUT 30 MINUTES BEFORE BREAKFAST  30 tablet  6  . predniSONE (DELTASONE) 10 MG tablet Take 3 pills once daily by mouth for 3 days and then 2 pills once daily for 3 days and then 1 pill daily for 3 days and then stop  18 tablet  0  . sildenafil (VIAGRA) 50 MG tablet Take 1 tablet (50 mg total) by mouth as needed for erectile dysfunction. 1 tablet by mouth as directed as needed before sexual activity.  9 tablet  5  . methylphenidate (METADATE ER) 20 MG ER tablet Take 1 tablet (20 mg total) by mouth every morning.  30 tablet  0     Review of Systems Review of Systems  Constitutional: Negative for fever, appetite change, fatigue and unexpected weight change.  Eyes: Negative for pain and visual disturbance.  Respiratory: Negative for cough and shortness of breath.   Cardiovascular: Negative for cp or palpitations    Gastrointestinal: Negative for nausea, diarrhea and constipation.  Genitourinary: Negative for urgency and frequency.  Skin: Negative for pallor or rash   Neurological: Negative for weakness, light-headedness, numbness and headaches.  Hematological: Negative for adenopathy. Does not bruise/bleed easily.  Psychiatric/Behavioral: Negative for dysphoric mood. The patient is nervous / anxious at times with much stress        Objective:   Physical Exam  Constitutional: He appears well-developed and well-nourished. No distress.  HENT:  Head: Normocephalic and atraumatic.  Right Ear: External ear normal.  Left Ear: External ear normal.  Nose: Nose normal.  Mouth/Throat: Oropharynx is clear and moist. No oropharyngeal exudate.  Eyes: Conjunctivae normal and EOM are normal. Pupils are equal, round, and reactive to light. Right eye exhibits no discharge. Left eye exhibits no discharge. No scleral icterus.  Neck:  Normal range of motion. Neck supple. No JVD present. Carotid bruit is not present. No thyromegaly present.  Cardiovascular: Normal rate, regular rhythm, normal heart sounds and intact distal pulses.  Exam reveals no gallop.  Pulmonary/Chest: Effort normal and breath sounds normal. No respiratory distress. He has no wheezes.  Abdominal: Soft. Bowel sounds are normal. He exhibits no distension, no abdominal bruit and no mass. There is no tenderness.  Musculoskeletal: Normal range of motion. He exhibits no edema and no tenderness.  Lymphadenopathy:    He has no cervical adenopathy.  Neurological: He is alert. No cranial nerve deficit. Coordination normal.  Skin: Skin is warm and dry. No rash noted. No erythema. No pallor.  Psychiatric: He has a normal mood and affect.          Assessment & Plan:

## 2012-03-31 NOTE — Patient Instructions (Addendum)
Stay on current medicines Avoid red meat/ fried foods/ egg yolks/ fatty breakfast meats/ butter, cheese and high fat dairy/ and shellfish  Keep watching diet for caffeine and other things that bother your reflux Please do stool card for colon cancer screening  Flu shot today  Talk to [facility] on the way out about your phone call concerns please  Make appt with Dr Patsy Lager on the way out for your back

## 2012-04-02 NOTE — Assessment & Plan Note (Signed)
Disc goals for lipids and reasons to control them Rev labs with pt Rev low sat fat diet in detail   

## 2012-04-02 NOTE — Assessment & Plan Note (Signed)
No new symptoms Lab Results  Component Value Date   PSA 1.29 03/27/2012   PSA 1.33 11/19/2010   PSA 1.79 11/08/2009   overall stable DRE not done today

## 2012-04-02 NOTE — Assessment & Plan Note (Signed)
No change in medicine- does ok with current  No complaints  Significant work stress does occas affect symptoms

## 2012-04-02 NOTE — Assessment & Plan Note (Signed)
Will continue protonix for now Pt may have a trial off of it at some point to see how he does now with a better diet

## 2012-04-02 NOTE — Assessment & Plan Note (Signed)
Reviewed health habits including diet and exercise and skin cancer prevention Also reviewed health mt list, fam hx and immunizations  Wellness labs reviewed in pt

## 2012-04-02 NOTE — Assessment & Plan Note (Signed)
Disc opt for screen IFOb given Pt is not ready for colonoscopy yet

## 2012-05-19 ENCOUNTER — Other Ambulatory Visit: Payer: Self-pay | Admitting: Family Medicine

## 2012-05-29 ENCOUNTER — Other Ambulatory Visit: Payer: Self-pay | Admitting: Family Medicine

## 2012-07-21 ENCOUNTER — Telehealth: Payer: Self-pay | Admitting: Family Medicine

## 2012-07-21 NOTE — Telephone Encounter (Signed)
Pt notified we can't call in Rx but if sxs worsen go to UC, pt said he will probably go to an UC and if he does then he will call and cancel his appt with Dr. Para March

## 2012-07-21 NOTE — Telephone Encounter (Signed)
Keep appt with Dr Para March but if symptoms worsen- will need to go to UC I cannot call in cough medicine but recommend mucinex DM otc and lots of fluids

## 2012-07-21 NOTE — Telephone Encounter (Signed)
Pt has appt with Dr. Para March 07/24/11, please advise

## 2012-07-21 NOTE — Telephone Encounter (Signed)
°  Patient Information:  Caller Name: Desi  Phone: [phone]  Patient: Benjamin Bullock, Benjamin Bullock  Gender: Male  DOB: 1950/11/24  Age: 61 Years  PCP: Roxy Manns [facility])  Office Follow Up:  Does the office need to follow up with this patient?: Yes  Instructions For The Office: Appts are full . Please call pt back at [phone] (c) or [phone](w). Pt states he does not need to be seen if MD can call in medicines that were called in for similar sx last year.   Symptoms  Reason For Call & Symptoms: Pt is calling for appt/appts full today. He wants to be seen today. Pt has a deep cough/congestion x 1 week. Cough has become continuous. Pt has had this before and office has called in cough medicine and expectorant  Reviewed Health History In EMR: Yes  Reviewed Medications In EMR: Yes  Reviewed Allergies In EMR: Yes  Reviewed Surgeries / Procedures: Yes  Date of Onset of Symptoms: 07/14/2012  Treatments Tried: Nyquil and Delsym  Treatments Tried Worked: No  Guideline(s) Used:  Cough  Disposition Per Guideline:   See Today or Tomorrow in Office  Reason For Disposition Reached:   Continuous (nonstop) coughing interferes with work or school and no improvement using cough treatment per Care Advice  Advice Given:  N/A

## 2012-07-23 ENCOUNTER — Ambulatory Visit: Payer: BC Managed Care – PPO | Admitting: Family Medicine

## 2012-09-26 ENCOUNTER — Other Ambulatory Visit: Payer: Self-pay | Admitting: Family Medicine

## 2013-01-07 ENCOUNTER — Other Ambulatory Visit: Payer: Self-pay | Admitting: *Deleted

## 2013-01-07 MED ORDER — ROSUVASTATIN CALCIUM 40 MG PO TABS: ORAL_TABLET | ORAL | Status: DC

## 2013-01-12 ENCOUNTER — Other Ambulatory Visit: Payer: Self-pay | Admitting: *Deleted

## 2013-01-12 MED ORDER — ROSUVASTATIN CALCIUM 40 MG PO TABS: ORAL_TABLET | ORAL | Status: DC

## 2013-04-28 ENCOUNTER — Other Ambulatory Visit: Payer: Self-pay | Admitting: Family Medicine

## 2013-05-10 ENCOUNTER — Telehealth: Payer: Self-pay | Admitting: Family Medicine

## 2013-05-10 DIAGNOSIS — Z125 Encounter for screening for malignant neoplasm of prostate: Secondary | ICD-10-CM

## 2013-05-10 DIAGNOSIS — Z0000 Encounter for general adult medical examination without abnormal findings: Secondary | ICD-10-CM

## 2013-05-10 NOTE — Telephone Encounter (Signed)
Message copied by Judy Pimple on Mon May 10, 2013  9:31 PM ------      Message from: Alvina Chou      Created: Fri Apr 30, 2013  4:13 PM      Regarding: Lab orders for Tuesday, 10.21.14       Patient is scheduled for CPX labs, please order future labs, Thanks , Terri       ------

## 2013-05-12 ENCOUNTER — Other Ambulatory Visit (INDEPENDENT_AMBULATORY_CARE_PROVIDER_SITE_OTHER): Payer: BC Managed Care – PPO

## 2013-05-12 DIAGNOSIS — Z23 Encounter for immunization: Secondary | ICD-10-CM

## 2013-05-12 DIAGNOSIS — Z0000 Encounter for general adult medical examination without abnormal findings: Secondary | ICD-10-CM

## 2013-05-12 DIAGNOSIS — E785 Hyperlipidemia, unspecified: Secondary | ICD-10-CM

## 2013-05-12 DIAGNOSIS — Z125 Encounter for screening for malignant neoplasm of prostate: Secondary | ICD-10-CM

## 2013-05-12 LAB — COMPREHENSIVE METABOLIC PANEL
ALT: 36 U/L (ref 0–53)
AST: 28 U/L (ref 0–37)
Albumin: 4.2 g/dL (ref 3.5–5.2)
Alkaline Phosphatase: 54 U/L (ref 39–117)
BUN: 14 mg/dL (ref 6–23)
CO2: 28 mEq/L (ref 19–32)
Calcium: 9.2 mg/dL (ref 8.4–10.5)
Chloride: 102 mEq/L (ref 96–112)
Creatinine, Ser: 0.8 mg/dL (ref 0.4–1.5)
GFR: 106.25 mL/min (ref >60.00)
Glucose, Bld: 112 mg/dL — ABNORMAL HIGH (ref 70–99)
Potassium: 4.3 mEq/L (ref 3.5–5.1)
Sodium: 138 mEq/L (ref 135–145)
Total Bilirubin: 1.1 mg/dL (ref 0.3–1.2)
Total Protein: 6.6 g/dL (ref 6.0–8.3)

## 2013-05-12 LAB — CBC WITH DIFFERENTIAL/PLATELET
Basophils Absolute: 0 10*3/uL (ref 0.0–0.1)
Basophils Relative: 0.5 % (ref 0.0–3.0)
Eosinophils Absolute: 0 10*3/uL (ref 0.0–0.7)
Eosinophils Relative: 1.2 % (ref 0.0–5.0)
HCT: 42.8 % (ref 39.0–52.0)
Hemoglobin: 14.7 g/dL (ref 13.0–17.0)
Lymphocytes Relative: 37.1 % (ref 12.0–46.0)
Lymphs Abs: 1.4 10*3/uL (ref 0.7–4.0)
MCHC: 34.3 g/dL (ref 30.0–36.0)
MCV: 90 fl (ref 78.0–100.0)
Monocytes Absolute: 0.3 10*3/uL (ref 0.1–1.0)
Monocytes Relative: 9 % (ref 3.0–12.0)
Neutro Abs: 2 10*3/uL (ref 1.4–7.7)
Neutrophils Relative %: 52.2 % (ref 43.0–77.0)
Platelets: 167 10*3/uL (ref 150.0–400.0)
RBC: 4.76 Mil/uL (ref 4.22–5.81)
RDW: 13.9 % (ref 11.5–14.6)
WBC: 3.8 10*3/uL — ABNORMAL LOW (ref 4.5–10.5)

## 2013-05-12 LAB — PSA: PSA: 1.62 ng/mL (ref 0.10–4.00)

## 2013-05-12 LAB — LIPID PANEL
Cholesterol: 198 mg/dL (ref 0–200)
HDL: 55.8 mg/dL (ref >39.00)
LDL Cholesterol: 122 mg/dL — ABNORMAL HIGH (ref 0–99)
Total CHOL/HDL Ratio: 4
Triglycerides: 103 mg/dL (ref 0.0–149.0)
VLDL: 20.6 mg/dL (ref 0.0–40.0)

## 2013-05-12 LAB — TSH: TSH: 2.78 u[IU]/mL (ref 0.35–5.50)

## 2013-05-17 ENCOUNTER — Ambulatory Visit (INDEPENDENT_AMBULATORY_CARE_PROVIDER_SITE_OTHER): Payer: BC Managed Care – PPO | Admitting: Family Medicine

## 2013-05-17 ENCOUNTER — Encounter: Payer: Self-pay | Admitting: Family Medicine

## 2013-05-17 VITALS — BP 116/70 | HR 76 | Temp 98.6°F | Ht 68.0 in | Wt 191.0 lb

## 2013-05-17 DIAGNOSIS — Z1211 Encounter for screening for malignant neoplasm of colon: Secondary | ICD-10-CM

## 2013-05-17 DIAGNOSIS — Z125 Encounter for screening for malignant neoplasm of prostate: Secondary | ICD-10-CM

## 2013-05-17 DIAGNOSIS — Z0000 Encounter for general adult medical examination without abnormal findings: Secondary | ICD-10-CM

## 2013-05-17 DIAGNOSIS — E785 Hyperlipidemia, unspecified: Secondary | ICD-10-CM

## 2013-05-17 MED ORDER — PANTOPRAZOLE SODIUM 40 MG PO TBEC: 40.0000 mg | DELAYED_RELEASE_TABLET | Freq: Every day | ORAL | Status: DC

## 2013-05-17 MED ORDER — ROSUVASTATIN CALCIUM 40 MG PO TABS: ORAL_TABLET | ORAL | Status: DC

## 2013-05-17 NOTE — Assessment & Plan Note (Signed)
D/w patient re:options for colon cancer screening, including IFOB vs. colonoscopy.  Risks and benefits of both were discussed and patient voiced understanding.  Pt elects for:IFOB card  

## 2013-05-17 NOTE — Assessment & Plan Note (Signed)
Lab Results  Component Value Date   PSA 1.62 05/12/2013   PSA 1.29 03/27/2012   PSA 1.33 11/19/2010   fairly stable  No symptoms  No family hx  DRE not done today

## 2013-05-17 NOTE — Progress Notes (Signed)
Subjective:    Patient ID: Benjamin Bullock, male    DOB: 30-Mar-1951, 62 y.o.   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 What his formal dx is -- arthritis or bursitis / ? Inflammation   Wt is down 2 lb with bmi  BP: 116/70 mmHg    Colon cancer screening - has not had a colonoscopy yet - not ready yet Will do stool card   Zoster status - is interested in that - has scheduled for November   Flu shot was this month  Prostate screen Lab Results  Component Value Date   PSA 1.62 05/12/2013   PSA 1.29 03/27/2012   PSA 1.33 11/19/2010   no family history  Symptoms -none   Glucose was 112-- from cortisone shot     Chemistry      Component Value Date/Time   NA 138 05/12/2013 0805   K 4.3 05/12/2013 0805   CL 102 05/12/2013 0805   CO2 28 05/12/2013 0805   BUN 14 05/12/2013 0805   CREATININE 0.8 05/12/2013 0805      Component Value Date/Time   CALCIUM 9.2 05/12/2013 0805   ALKPHOS 54 05/12/2013 0805   AST 28 05/12/2013 0805   ALT 36 05/12/2013 0805   BILITOT 1.1 05/12/2013 0805       hyperlipiedmia  crestor and diet  Lab Results  Component Value Date   CHOL 198 05/12/2013   CHOL 175 03/27/2012   CHOL 157 11/19/2010   Lab Results  Component Value Date   HDL 55.80 05/12/2013   HDL 48.70 03/27/2012   HDL 46.60 11/19/2010   Lab Results  Component Value Date   LDLCALC 122* 05/12/2013   LDLCALC 97 03/27/2012   LDLCALC 92 11/19/2010   Lab Results  Component Value Date   TRIG 103.0 05/12/2013   TRIG 149.0 03/27/2012   TRIG 90.0 11/19/2010   Lab Results  Component Value Date   CHOLHDL 4 05/12/2013   CHOLHDL 4 03/27/2012   CHOLHDL 3 11/19/2010   Lab Results  Component Value Date   LDLDIRECT 130.9 10/25/2008   LDLDIRECT 141.4 03/16/2007   LDLDIRECT 139.6 09/18/2006   good and bad chol went up  Missed a few doses of crestor - and has been eating some cheetos / chips     Mood- good / does not think the benefits outweigh the cons for his ADD med Doing well    Patient Active Problem List   Diagnosis Date Noted  . Colon cancer screening 03/31/2012  . Allergic drug reaction 01/29/2012  . Back pain 02/05/2011  . Routine general medical examination at a health care facility 11/08/2010  . Prostate cancer screening 11/08/2010  . GERD 09/20/2009  . ANXIETY STATE NOS 01/08/2007  . ADD 01/08/2007  . HYPERLIPIDEMIA 01/07/2007  . ERECTILE DYSFUNCTION 01/07/2007  . DEPRESSION 01/07/2007  . ALLERGIC RHINITIS 01/07/2007   Past Medical History  Diagnosis Date  . Allergy     allergic rhinitis  . Anxiety   . Depression   . ADD (attention deficit disorder)   . Hyperlipidemia   . Bursitis of hip, right   . History of meniscal tear  left knee meniscal tear   No past surgical history on file. History  Substance Use Topics  . Smoking status: Former Games developer  . Smokeless tobacco: Not on file     Comment: Smoked for about one year 30 years ago  . Alcohol Use: Yes     Comment: occ   Family History  Problem Relation Age of Onset  . Hypertension Mother   . Depression Mother   . Heart disease Father     CAD  . Heart disease Paternal Uncle     CAD  . COPD Maternal Grandfather    Allergies  Allergen Reactions  . Aleve [Naproxen Sodium] Swelling    Face and lips swollen and hives  . Omeprazole     REACTION: not effective   Current Outpatient Prescriptions on File Prior to Visit  Medication Sig Dispense Refill  . pantoprazole (PROTONIX) 40 MG tablet TAKE 1 TABLET BY MOUTH EVERY MORNING ABOUT 30 MINS BEFORE BREAKFAST  30 tablet  0  . rosuvastatin (CRESTOR) 40 MG tablet TAKE 1 TABLET EVERY DAY  30 tablet  3  . sildenafil (VIAGRA) 50 MG tablet Take 1 tablet (50 mg total) by mouth as needed for  erectile dysfunction. 1 tablet by mouth as directed as needed before sexual activity.  9 tablet  5   No current facility-administered medications on file prior to visit.    Review of Systems     Objective:   Physical Exam  Constitutional: He appears well-developed and well-nourished. No distress.  HENT:  Head: Normocephalic and atraumatic.  Right Ear: External ear normal.  Left Ear: External ear normal.  Nose: Nose normal.  Mouth/Throat: Oropharynx is clear and moist.  Eyes: Conjunctivae and EOM are normal. Pupils are equal, round, and reactive to light. Right eye exhibits no discharge. Left eye exhibits no discharge. No scleral icterus.  Neck: Normal range of motion. Neck supple. No JVD present. Carotid bruit is not present. No thyromegaly present.  Cardiovascular: Normal rate, regular rhythm, normal heart sounds and intact distal pulses.  Exam reveals no gallop.   Pulmonary/Chest: Effort normal and breath sounds normal. No respiratory distress. He has no wheezes. He exhibits no tenderness.  Abdominal: Soft. Bowel sounds are normal. He exhibits no distension, no abdominal bruit and no mass. There is no tenderness.  Musculoskeletal: He exhibits no edema and no tenderness.  Lymphadenopathy:    He has no cervical adenopathy.  Neurological: He is alert. He has normal reflexes. No cranial nerve deficit. He exhibits normal muscle tone. Coordination normal.  Skin: Skin is warm and dry. No rash noted. No erythema. No pallor.  Solar lentigos diffusely   Psychiatric: He has a normal mood and affect.  Not as "hyper" as usual Somewhat tangential           Assessment & Plan:

## 2013-05-17 NOTE — Assessment & Plan Note (Signed)
Disc goals for lipids and reasons to control them Rev labs with pt Rev low sat fat diet in detail Will try to be more compliant with medication dosage

## 2013-05-17 NOTE — Assessment & Plan Note (Signed)
Reviewed health habits including diet and exercise and skin cancer prevention Also reviewed health mt list, fam hx and immunizations   Rev wellness labs

## 2013-05-17 NOTE — Patient Instructions (Signed)
Please do stool card for colon cancer screening  Avoid red meat/ fried foods/ egg yolks/ fatty breakfast meats/ butter, cheese and high fat dairy/ and shellfish   Keep exercising low impact

## 2013-05-19 ENCOUNTER — Encounter: Payer: BC Managed Care – PPO | Admitting: Family Medicine

## 2013-06-11 ENCOUNTER — Other Ambulatory Visit: Payer: Self-pay | Admitting: Family Medicine

## 2013-06-16 ENCOUNTER — Ambulatory Visit (INDEPENDENT_AMBULATORY_CARE_PROVIDER_SITE_OTHER): Payer: BC Managed Care – PPO

## 2013-06-16 DIAGNOSIS — Z2911 Encounter for prophylactic immunotherapy for respiratory syncytial virus (RSV): Secondary | ICD-10-CM

## 2013-06-16 DIAGNOSIS — Z23 Encounter for immunization: Secondary | ICD-10-CM

## 2013-07-05 ENCOUNTER — Other Ambulatory Visit: Payer: BC Managed Care – PPO

## 2013-07-12 ENCOUNTER — Encounter: Payer: BC Managed Care – PPO | Admitting: Family Medicine

## 2013-08-20 ENCOUNTER — Encounter: Payer: Self-pay | Admitting: Family Medicine

## 2013-08-20 ENCOUNTER — Ambulatory Visit (INDEPENDENT_AMBULATORY_CARE_PROVIDER_SITE_OTHER): Payer: BC Managed Care – PPO | Admitting: Family Medicine

## 2013-08-20 VITALS — BP 112/72 | HR 72 | Temp 97.4°F | Ht 68.0 in | Wt 192.8 lb

## 2013-08-20 DIAGNOSIS — F419 Anxiety disorder, unspecified: Secondary | ICD-10-CM

## 2013-08-20 DIAGNOSIS — F411 Generalized anxiety disorder: Secondary | ICD-10-CM

## 2013-08-20 NOTE — Progress Notes (Signed)
Subjective:    Patient ID: Benjamin Bullock, male    DOB: 03-May-1951, 63 y.o.   MRN: [id number]  HPI Here for medicine refills / chronic problems   Had a cortisone shot - in his hip and has had great improvement  Is able to do some walking now - intermittently with bike as tolerated  Sees Dr Elmyra Ricks and Dr French Ana    He has a lot of anxiety when he travels   (worried about work)- on vacation  During the weekend it is not a big deal  Also does not get paid when he is not in the office  Also sometimes in the middle of the night - worried  His wife is concerned about that   A little irrational worry A little OCD behavior - ? obscessvie  thoughts   He is able to relax at times   ? Marital issues - he dislikes confrontation and his wife craves it  He has ADD - and has not taken med for a while  Stopped taking ritalin because it was not making much of a difference   Exercise helps - recumbant bike and walking   ? If prioritizing correctly  Patient Active Problem List   Diagnosis Date Noted  . Colon cancer screening 03/31/2012  . Allergic drug reaction 01/29/2012  . Back pain 02/05/2011  . Routine general medical examination at a health care facility 11/08/2010  . Prostate cancer screening 11/08/2010  . GERD 09/20/2009  . ANXIETY STATE NOS 01/08/2007  . ADD 01/08/2007  . HYPERLIPIDEMIA 01/07/2007  . ERECTILE DYSFUNCTION 01/07/2007  . DEPRESSION 01/07/2007  . ALLERGIC RHINITIS 01/07/2007   Past Medical History  Diagnosis Date  . Allergy     allergic rhinitis  . Anxiety   . Depression   . ADD (attention deficit disorder)   . Hyperlipidemia   . Bursitis of hip, right   . History of meniscal tear     left knee meniscal tear   No past surgical history on file. History  Substance Use Topics  . Smoking status: Former Research scientist (life sciences)  . Smokeless tobacco: Not on file     Comment: Smoked for about one year 30 years ago  . Alcohol Use: Yes     Comment: occ   Family History    Problem Relation Age of Onset  . Hypertension Mother   . Depression Mother   . Heart disease Father     CAD  . Heart disease Paternal Uncle     CAD  . COPD Maternal Grandfather    Allergies  Allergen Reactions  . Aleve [Naproxen Sodium] Swelling    Face and lips swollen and hives  . Omeprazole     REACTION: not effective   Current Outpatient Prescriptions on File Prior to Visit  Medication Sig Dispense Refill  . pantoprazole (PROTONIX) 40 MG tablet Take 1 tablet (40 mg total) by mouth daily.  30 tablet  11  . rosuvastatin (CRESTOR) 40 MG tablet TAKE 1 TABLET EVERY DAY  30 tablet  11  . VIAGRA 50 MG tablet TAKE 1 TABLET BY MOUTH AS NEEDED FOR ERECTILE DYSFUNCTION AS DIRECTED BEFORE SEXUAL ACTIVITY  9 tablet  5  . traMADol (ULTRAM) 50 MG tablet Take 1 tablet by mouth as needed.       No current facility-administered medications on file prior to visit.    Review of Systems Review of Systems  Constitutional: Negative for fever, appetite change, fatigue and unexpected weight change.  Eyes: Negative for pain and visual disturbance.  Respiratory: Negative for cough and shortness of breath.   Cardiovascular: Negative for cp or palpitations    Gastrointestinal: Negative for nausea, diarrhea and constipation.  Genitourinary: Negative for urgency and frequency.  Skin: Negative for pallor or rash   Neurological: Negative for weakness, light-headedness, numbness and headaches.  Hematological: Negative for adenopathy. Does not bruise/bleed easily.  Psychiatric/Behavioral: Negative for dysphoric mood. The patient is nervous/anxious.  pos for ADD that he feels is controlled        Objective:   Physical Exam  Constitutional: He appears well-developed and well-nourished. No distress.  HENT:  Head: Normocephalic and atraumatic.  Mouth/Throat: Oropharynx is clear and moist.  Eyes: Conjunctivae and EOM are normal. Pupils are equal, round, and reactive to light. No scleral icterus.  Neck:  Normal range of motion. Neck supple. Carotid bruit is not present. No thyromegaly present.  Cardiovascular: Normal rate and regular rhythm.  Exam reveals no gallop.   Pulmonary/Chest: Effort normal and breath sounds normal. No respiratory distress. He has no wheezes. He has no rales.  Musculoskeletal: He exhibits no edema.  Lymphadenopathy:    He has no cervical adenopathy.  Neurological: He is alert. He has normal reflexes. He displays no tremor. He exhibits normal muscle tone. Coordination normal.  Skin: Skin is warm and dry. No rash noted. No erythema. No pallor.  Psychiatric: His behavior is normal. Thought content normal. His mood appears anxious. His affect is not blunt, not labile and not inappropriate. His speech is rapid and/or pressured. He does not exhibit a depressed mood.  Pt is attentive today At times speech is rapid-which is baseline for him Seems to have good insight          Assessment & Plan:

## 2013-08-20 NOTE — Patient Instructions (Signed)
Keep exercising  Stop up front for a counseling referral  Take care of yourself  We could consider medicine for mood if necessary- I would like to have you talk to someone first

## 2013-08-20 NOTE — Progress Notes (Signed)
Pre-visit discussion using our clinic review tool. No additional management support is needed unless otherwise documented below in the visit note.  

## 2013-08-22 NOTE — Assessment & Plan Note (Signed)
Long discussion re: worry about work and finances/ sense of responsibility and inability to relax/ take vacation  ? Some marital issues surrounding priorities - re : work vs home  Reviewed stressors/ coping techniques/symptoms/ support sources/ tx options and side effects in detail today  Will ref to counselor to work on issues He does not feel that tx of ADD helps him  Would consider medication for anxiety if this was necessary  >25 min spent with face to face with patient, >50% counseling and/or coordinating care

## 2013-09-16 ENCOUNTER — Ambulatory Visit: Payer: BC Managed Care – PPO | Admitting: Psychology

## 2013-09-23 ENCOUNTER — Ambulatory Visit: Payer: BC Managed Care – PPO | Admitting: Psychology

## 2014-02-07 ENCOUNTER — Other Ambulatory Visit: Payer: Self-pay | Admitting: Family Medicine

## 2014-03-17 ENCOUNTER — Telehealth: Payer: Self-pay | Admitting: Family Medicine

## 2014-03-17 NOTE — Telephone Encounter (Signed)
Pt calling to schedule his CPE. Pt informed that you do not have anything CPE time slots available til January. Pt stated he needed sooner due to an insurance form needing to be filled out so that he does not accrue points on his insurance. His last CPE was 2013. Would you be willing to see him in any 30 min slot? Thank you

## 2014-03-17 NOTE — Telephone Encounter (Signed)
Pt called and needs cpe + forms for insurance completed by Jun 05, 2014. Pt last seen for cpe was 03/2012. Would you be able to accommodate pt for cpe before Nov 15?  #[phone]

## 2014-03-17 NOTE — Telephone Encounter (Signed)
Yes-please put him in a 30 min slot

## 2014-03-30 ENCOUNTER — Telehealth: Payer: Self-pay | Admitting: Family Medicine

## 2014-03-30 DIAGNOSIS — E785 Hyperlipidemia, unspecified: Secondary | ICD-10-CM

## 2014-03-30 DIAGNOSIS — Z0000 Encounter for general adult medical examination without abnormal findings: Secondary | ICD-10-CM

## 2014-03-30 DIAGNOSIS — Z125 Encounter for screening for malignant neoplasm of prostate: Secondary | ICD-10-CM

## 2014-03-30 NOTE — Telephone Encounter (Signed)
Message copied by Abner Greenspan on Wed Mar 30, 2014 10:06 PM ------      Message from: Ellamae Sia      Created: Wed Mar 23, 2014 10:35 AM      Regarding: Lab orders for Thursday, 9.10.15       Patient is scheduled for CPX labs, please order future labs, Thanks , Terri       ------

## 2014-03-31 ENCOUNTER — Other Ambulatory Visit (INDEPENDENT_AMBULATORY_CARE_PROVIDER_SITE_OTHER): Payer: BC Managed Care – PPO

## 2014-03-31 DIAGNOSIS — Z0000 Encounter for general adult medical examination without abnormal findings: Secondary | ICD-10-CM

## 2014-03-31 DIAGNOSIS — E785 Hyperlipidemia, unspecified: Secondary | ICD-10-CM

## 2014-03-31 DIAGNOSIS — Z125 Encounter for screening for malignant neoplasm of prostate: Secondary | ICD-10-CM

## 2014-03-31 LAB — LIPID PANEL
Cholesterol: 163 mg/dL (ref 0–200)
HDL: 44.3 mg/dL (ref >39.00)
LDL Cholesterol: 98 mg/dL (ref 0–99)
NonHDL: 118.7
Total CHOL/HDL Ratio: 4
Triglycerides: 105 mg/dL (ref 0.0–149.0)
VLDL: 21 mg/dL (ref 0.0–40.0)

## 2014-03-31 LAB — COMPREHENSIVE METABOLIC PANEL
ALT: 32 U/L (ref 0–53)
AST: 25 U/L (ref 0–37)
Albumin: 4 g/dL (ref 3.5–5.2)
Alkaline Phosphatase: 48 U/L (ref 39–117)
BUN: 14 mg/dL (ref 6–23)
CO2: 27 mEq/L (ref 19–32)
Calcium: 9 mg/dL (ref 8.4–10.5)
Chloride: 104 mEq/L (ref 96–112)
Creatinine, Ser: 0.9 mg/dL (ref 0.4–1.5)
GFR: 87.58 mL/min (ref >60.00)
Glucose, Bld: 100 mg/dL — ABNORMAL HIGH (ref 70–99)
Potassium: 4.2 mEq/L (ref 3.5–5.1)
Sodium: 138 mEq/L (ref 135–145)
Total Bilirubin: 0.6 mg/dL (ref 0.2–1.2)
Total Protein: 6.6 g/dL (ref 6.0–8.3)

## 2014-03-31 LAB — CBC WITH DIFFERENTIAL/PLATELET
Basophils Absolute: 0 10*3/uL (ref 0.0–0.1)
Basophils Relative: 0.6 % (ref 0.0–3.0)
Eosinophils Absolute: 0.1 10*3/uL (ref 0.0–0.7)
Eosinophils Relative: 1.5 % (ref 0.0–5.0)
HCT: 44.4 % (ref 39.0–52.0)
Hemoglobin: 15 g/dL (ref 13.0–17.0)
Lymphocytes Relative: 32.7 % (ref 12.0–46.0)
Lymphs Abs: 1.5 10*3/uL (ref 0.7–4.0)
MCHC: 33.8 g/dL (ref 30.0–36.0)
MCV: 90.6 fl (ref 78.0–100.0)
Monocytes Absolute: 0.4 10*3/uL (ref 0.1–1.0)
Monocytes Relative: 8.8 % (ref 3.0–12.0)
Neutro Abs: 2.5 10*3/uL (ref 1.4–7.7)
Neutrophils Relative %: 56.4 % (ref 43.0–77.0)
Platelets: 192 10*3/uL (ref 150.0–400.0)
RBC: 4.9 Mil/uL (ref 4.22–5.81)
RDW: 14 % (ref 11.5–15.5)
WBC: 4.5 10*3/uL (ref 4.0–10.5)

## 2014-03-31 LAB — PSA: PSA: 1.54 ng/mL (ref 0.10–4.00)

## 2014-03-31 LAB — TSH: TSH: 2.98 u[IU]/mL (ref 0.35–4.50)

## 2014-04-04 ENCOUNTER — Ambulatory Visit (INDEPENDENT_AMBULATORY_CARE_PROVIDER_SITE_OTHER): Payer: BC Managed Care – PPO | Admitting: Family Medicine

## 2014-04-04 ENCOUNTER — Encounter: Payer: Self-pay | Admitting: Family Medicine

## 2014-04-04 VITALS — BP 130/80 | HR 60 | Temp 98.7°F | Ht 67.0 in | Wt 190.2 lb

## 2014-04-04 DIAGNOSIS — E785 Hyperlipidemia, unspecified: Secondary | ICD-10-CM

## 2014-04-04 DIAGNOSIS — Z1211 Encounter for screening for malignant neoplasm of colon: Secondary | ICD-10-CM

## 2014-04-04 DIAGNOSIS — Z23 Encounter for immunization: Secondary | ICD-10-CM

## 2014-04-04 DIAGNOSIS — Z125 Encounter for screening for malignant neoplasm of prostate: Secondary | ICD-10-CM

## 2014-04-04 DIAGNOSIS — F411 Generalized anxiety disorder: Secondary | ICD-10-CM

## 2014-04-04 DIAGNOSIS — Z0000 Encounter for general adult medical examination without abnormal findings: Secondary | ICD-10-CM

## 2014-04-04 MED ORDER — ALPRAZOLAM 0.5 MG PO TABS: 0.5000 mg | ORAL_TABLET | Freq: Two times a day (BID) | ORAL | Status: DC | PRN

## 2014-04-04 NOTE — Progress Notes (Signed)
Subjective:    Patient ID: Benjamin Bullock, male    DOB: 1951/07/10, 63 y.o.   MRN: [id number]  HPI Here for health maintenance exam and to review chronic medical problems    Doing fairly well overall  Having aches and pains as he ages - esp hip problems (sees Dr Sharmaine Base tramadol as needed)- also did physical therapy Needs insurance appt for his biometric   Needs xanax for an upcoming trip  He gets anxious with traveling occas    Wt is down 2 lb with bmi of 29  bp is up a bit today- does not know why  BP Readings from Last 3 Encounters:  04/04/14 140/90  08/20/13 112/72  05/17/13 116/70     Flu vaccine today  Colon cancer screen - does not want to get one -but not ready yet  Has a stool card instead   Td 9/12  Zoster vaccine 11/14  Pneumonia vaccine will do today   Prostate cancer screen Lab Results  Component Value Date   PSA 1.54 03/31/2014   PSA 1.62 05/12/2013   PSA 1.29 03/27/2012   stable No prostate problems or flow issues  His nocturia only occurs with large water intake or if he drinks alcohol   Hyperlipidemia  crestor and diet  Lab Results  Component Value Date   CHOL 163 03/31/2014   CHOL 198 05/12/2013   CHOL 175 03/27/2012   Lab Results  Component Value Date   HDL 44.30 03/31/2014   HDL 55.80 05/12/2013   HDL 48.70 03/27/2012   Lab Results  Component Value Date   LDLCALC 98 03/31/2014   LDLCALC 122* 05/12/2013   LDLCALC 97 03/27/2012   Lab Results  Component Value Date   TRIG 105.0 03/31/2014   TRIG 103.0 05/12/2013   TRIG 149.0 03/27/2012   Lab Results  Component Value Date   CHOLHDL 4 03/31/2014   CHOLHDL 4 05/12/2013   CHOLHDL 4 03/27/2012   Lab Results  Component Value Date   LDLDIRECT 130.9 10/25/2008   LDLDIRECT 141.4 03/16/2007   LDLDIRECT 139.6 09/18/2006   HDL is down a bit -cannot exercise as much  Overall LDL is good and ratio is the same    Patient Active Problem List   Diagnosis Date Noted  . Anxiety 08/20/2013  .  Colon cancer screening 03/31/2012  . Allergic drug reaction 01/29/2012  . Back pain 02/05/2011  . Routine general medical examination at a health care facility 11/08/2010  . Prostate cancer screening 11/08/2010  . GERD 09/20/2009  . ANXIETY STATE NOS 01/08/2007  . ADD 01/08/2007  . HYPERLIPIDEMIA 01/07/2007  . ERECTILE DYSFUNCTION 01/07/2007  . DEPRESSION 01/07/2007  . ALLERGIC RHINITIS 01/07/2007   Past Medical History  Diagnosis Date  . Allergy     allergic rhinitis  . Anxiety   . Depression   . ADD (attention deficit disorder)   . Hyperlipidemia   . Bursitis of hip, right   . History of meniscal tear     left knee meniscal tear   No past surgical history on file. History  Substance Use Topics  . Smoking status: Former Research scientist (life sciences)  . Smokeless tobacco: Not on file     Comment: Smoked for about one year 30 years ago  . Alcohol Use: Yes     Comment: occ   Family History  Problem Relation Age of Onset  . Hypertension Mother   . Depression Mother   . Heart disease Father  CAD  . Heart disease Paternal Uncle     CAD  . COPD Maternal Grandfather    Allergies  Allergen Reactions  . Aleve [Naproxen Sodium] Swelling    Face and lips swollen and hives  . Omeprazole     REACTION: not effective   Current Outpatient Prescriptions on File Prior to Visit  Medication Sig Dispense Refill  . CRESTOR 40 MG tablet TAKE 1 TABLET BY MOUTH EVERY DAY  30 tablet  3  . pantoprazole (PROTONIX) 40 MG tablet Take 1 tablet (40 mg total) by mouth daily.  30 tablet  11  . traMADol (ULTRAM) 50 MG tablet Take 1 tablet by mouth as needed.      Marland Kitchen VIAGRA 50 MG tablet TAKE 1 TABLET BY MOUTH AS NEEDED FOR ERECTILE DYSFUNCTION AS DIRECTED BEFORE SEXUAL ACTIVITY  9 tablet  5   No current facility-administered medications on file prior to visit.    Review of Systems Review of Systems  Constitutional: Negative for fever, appetite change, fatigue and unexpected weight change.  Eyes: Negative for  pain and visual disturbance.  Respiratory: Negative for cough and shortness of breath.   Cardiovascular: Negative for cp or palpitations    Gastrointestinal: Negative for nausea, diarrhea and constipation.  Genitourinary: Negative for urgency and frequency.  Skin: Negative for pallor or rash   Neurological: Negative for weakness, light-headedness, numbness and headaches.  Hematological: Negative for adenopathy. Does not bruise/bleed easily.  Psychiatric/Behavioral: Negative for dysphoric mood. Pos for anxiety while traveling         Objective:   Physical Exam  Constitutional: He appears well-developed and well-nourished. No distress.  overwt and well appearing   HENT:  Head: Normocephalic and atraumatic.  Right Ear: External ear normal.  Left Ear: External ear normal.  Nose: Nose normal.  Mouth/Throat: Oropharynx is clear and moist.  Eyes: Conjunctivae and EOM are normal. Pupils are equal, round, and reactive to light. Right eye exhibits no discharge. Left eye exhibits no discharge. No scleral icterus.  Neck: Normal range of motion. Neck supple. No JVD present. Carotid bruit is not present. No thyromegaly present.  Cardiovascular: Normal rate, regular rhythm, normal heart sounds and intact distal pulses.  Exam reveals no gallop.   Pulmonary/Chest: Effort normal and breath sounds normal. No respiratory distress. He has no wheezes. He exhibits no tenderness.  Abdominal: Soft. Bowel sounds are normal. He exhibits no distension, no abdominal bruit and no mass. There is no tenderness.  Genitourinary:  DRE deferred   Musculoskeletal: He exhibits no edema and no tenderness.  Lymphadenopathy:    He has no cervical adenopathy.  Neurological: He is alert. He has normal reflexes. No cranial nerve deficit. He exhibits normal muscle tone. Coordination normal.  Skin: Skin is warm and dry. No rash noted. No erythema. No pallor.  Psychiatric: He has a normal mood and affect.            Assessment & Plan:   Problem List Items Addressed This Visit     Other   HYPERLIPIDEMIA     Disc goals for lipids and reasons to control them Rev labs with pt Rev low sat fat diet in detail Doing well with crestor and diet     ANXIETY STATE NOS     Refilled xanax for use when traveling - disc precautions Pt uses this sparingly    Relevant Medications      ALPRAZolam Duanne Moron) tablet   Routine general medical examination at a health care facility  Reviewed health habits including diet and exercise and skin cancer prevention Reviewed appropriate screening tests for age  Also reviewed health mt list, fam hx and immunization status , as well as social and family history   Labs reviewed  Enc exercise     Prostate cancer screening      Lab Results  Component Value Date   PSA 1.54 03/31/2014   PSA 1.62 05/12/2013   PSA 1.29 03/27/2012   No symptoms or family hx of prostate cancer     Colon cancer screening     Pt not ready to schedule colonosc D/w patient [id number] for colon cancer screening, including IFOB vs. colonoscopy.  Risks and benefits of both were discussed and patient voiced understanding.  Pt elects for: IFOB card      Other Visit Diagnoses   Need for prophylactic vaccination and inoculation against influenza    -  Primary    Relevant Orders       Flu Vaccine QUAD 36+ mos PF IM (Fluarix Quad PF) (Completed)    Need for 23-polyvalent pneumococcal polysaccharide vaccine        Relevant Orders       Pneumococcal polysaccharide vaccine 23-valent greater than or equal to 2yo subcutaneous/IM (Completed)

## 2014-04-04 NOTE — Assessment & Plan Note (Signed)
Refilled xanax for use when traveling - disc precautions Pt uses this sparingly

## 2014-04-04 NOTE — Assessment & Plan Note (Signed)
Pt not ready to schedule colonosc D/w patient [id number] for colon cancer screening, including IFOB vs. colonoscopy.  Risks and benefits of both were discussed and patient voiced understanding.  Pt elects for: IFOB card

## 2014-04-04 NOTE — Patient Instructions (Signed)
Blood pressure was better on re check today  Flu vaccine today Pneumonia vaccine today  Take care of yourself

## 2014-04-04 NOTE — Assessment & Plan Note (Signed)
Lab Results  Component Value Date   PSA 1.54 03/31/2014   PSA 1.62 05/12/2013   PSA 1.29 03/27/2012   No symptoms or family hx of prostate cancer

## 2014-04-04 NOTE — Assessment & Plan Note (Signed)
Disc goals for lipids and reasons to control them Rev labs with pt Rev low sat fat diet in detail Doing well with crestor and diet  

## 2014-04-04 NOTE — Assessment & Plan Note (Signed)
Reviewed health habits including diet and exercise and skin cancer prevention Reviewed appropriate screening tests for age  Also reviewed health mt list, fam hx and immunization status , as well as social and family history   Labs reviewed  Enc exercise

## 2014-04-04 NOTE — Progress Notes (Signed)
Pre visit review using our clinic review tool, if applicable. No additional management support is needed unless otherwise documented below in the visit note. 

## 2014-05-09 ENCOUNTER — Telehealth: Payer: Self-pay

## 2014-05-09 NOTE — Telephone Encounter (Signed)
Pt left v/m; pt has a new grandchild due in 06/2014. Pt had tdap 04/17/2011. Pt wants to know if needs additional protection from whooping cough.Please advise.

## 2014-05-09 NOTE — Telephone Encounter (Signed)
Left voicemail letting pt know he is up to date on vaccine

## 2014-05-09 NOTE — Telephone Encounter (Signed)
No-if he had a Tdap in 2012-he is good!

## 2014-05-13 ENCOUNTER — Encounter (HOSPITAL_COMMUNITY): Payer: Self-pay | Admitting: Emergency Medicine

## 2014-05-13 ENCOUNTER — Other Ambulatory Visit: Payer: Self-pay | Admitting: Family Medicine

## 2014-05-13 ENCOUNTER — Emergency Department (HOSPITAL_COMMUNITY)
Admission: EM | Admit: 2014-05-13 | Discharge: 2014-05-13 | Disposition: A | Discharge status: Home / Self Care | Payer: BC Managed Care – PPO | Attending: Emergency Medicine | Admitting: Emergency Medicine

## 2014-05-13 ENCOUNTER — Telehealth: Payer: Self-pay | Admitting: Family Medicine

## 2014-05-13 DIAGNOSIS — Z8739 Personal history of other diseases of the musculoskeletal system and connective tissue: Secondary | ICD-10-CM | POA: Diagnosis not present

## 2014-05-13 DIAGNOSIS — Z79899 Other long term (current) drug therapy: Secondary | ICD-10-CM | POA: Insufficient documentation

## 2014-05-13 DIAGNOSIS — S61211A Laceration without foreign body of left index finger without damage to nail, initial encounter: Secondary | ICD-10-CM | POA: Insufficient documentation

## 2014-05-13 DIAGNOSIS — Z87891 Personal history of nicotine dependence: Secondary | ICD-10-CM | POA: Diagnosis not present

## 2014-05-13 DIAGNOSIS — F329 Major depressive disorder, single episode, unspecified: Secondary | ICD-10-CM | POA: Diagnosis not present

## 2014-05-13 DIAGNOSIS — Y288XXA Contact with other sharp object, undetermined intent, initial encounter: Secondary | ICD-10-CM | POA: Insufficient documentation

## 2014-05-13 DIAGNOSIS — F419 Anxiety disorder, unspecified: Secondary | ICD-10-CM | POA: Diagnosis not present

## 2014-05-13 DIAGNOSIS — S61213A Laceration without foreign body of left middle finger without damage to nail, initial encounter: Secondary | ICD-10-CM

## 2014-05-13 DIAGNOSIS — Y9289 Other specified places as the place of occurrence of the external cause: Secondary | ICD-10-CM | POA: Insufficient documentation

## 2014-05-13 DIAGNOSIS — S6992XA Unspecified injury of left wrist, hand and finger(s), initial encounter: Secondary | ICD-10-CM | POA: Diagnosis present

## 2014-05-13 DIAGNOSIS — Y9389 Activity, other specified: Secondary | ICD-10-CM | POA: Insufficient documentation

## 2014-05-13 DIAGNOSIS — Z8639 Personal history of other endocrine, nutritional and metabolic disease: Secondary | ICD-10-CM | POA: Diagnosis not present

## 2014-05-13 MED ORDER — LIDOCAINE HCL (PF) 1 % IJ SOLN
5.0000 mL | Freq: Once | INTRAMUSCULAR | Status: AC
  Administered 2014-05-13: 5 mL
  Filled 2014-05-13: qty 5

## 2014-05-13 MED ORDER — HYDROCODONE-ACETAMINOPHEN 5-325 MG PO TABS: 2.0000 | ORAL_TABLET | ORAL | Status: DC | PRN

## 2014-05-13 MED ORDER — HYDROCODONE-ACETAMINOPHEN 5-325 MG PO TABS
2.0000 | ORAL_TABLET | Freq: Once | ORAL | Status: AC
  Administered 2014-05-13: 2 via ORAL
  Filled 2014-05-13: qty 2

## 2014-05-13 NOTE — ED Notes (Signed)
Pt cut middle and index finger while opening a bottle of wine. Neck of bottle broke inside pt's hand. Bleeding is controlled.

## 2014-05-13 NOTE — ED Provider Notes (Signed)
CSN: [id number]     Arrival date & time 05/13/14  1929 History   First MD Initiated Contact with Patient 05/13/14 2007     Chief Complaint  Patient presents with  . Finger Injury     (Consider location/radiation/quality/duration/timing/severity/associated sxs/prior Treatment) HPI Comments: Patient is a 63 year old male who presents with a laceration to left index and middle finger that occurred prior to arrival while opening a bottle of wine. Patient reports sudden onset of sharp, severe pain that does not radiate. Patient reports associated bleeding. Patient held pressure on the lacerations to control bleeding. Patient is UTD with tetanus. No other injury. No aggravating/alleviating factors.    Past Medical History  Diagnosis Date  . Allergy     allergic rhinitis  . Anxiety   . Depression   . ADD (attention deficit disorder)   . Hyperlipidemia   . Bursitis of hip, right   . History of meniscal tear     left knee meniscal tear   No past surgical history on file. Family History  Problem Relation Age of Onset  . Hypertension Mother   . Depression Mother   . Heart disease Father     CAD  . Heart disease Paternal Uncle     CAD  . COPD Maternal Grandfather    History  Substance Use Topics  . Smoking status: Former Research scientist (life sciences)  . Smokeless tobacco: Not on file     Comment: Smoked for about one year 30 years ago  . Alcohol Use: Yes     Comment: occ    Review of Systems  Constitutional: Negative for fever, chills and fatigue.  HENT: Negative for trouble swallowing.   Eyes: Negative for visual disturbance.  Respiratory: Negative for shortness of breath.   Cardiovascular: Negative for chest pain and palpitations.  Gastrointestinal: Negative for nausea, vomiting, abdominal pain and diarrhea.  Genitourinary: Negative for dysuria and difficulty urinating.  Musculoskeletal: Negative for arthralgias and neck pain.  Skin: Positive for wound. Negative for color change.   Neurological: Negative for dizziness and weakness.  Psychiatric/Behavioral: Negative for dysphoric mood.      Allergies  Aleve and Omeprazole  Home Medications   Prior to Admission medications   Medication Sig Start Date End Date Taking? Authorizing Provider  ALPRAZolam Duanne Moron) 0.5 MG tablet Take 1 tablet (0.5 mg total) by mouth 2 (two) times daily as needed for anxiety (for travel anxiety). 04/04/14   Abner Greenspan, MD  CRESTOR 40 MG tablet TAKE 1 TABLET BY MOUTH EVERY DAY 02/07/14   Abner Greenspan, MD  pantoprazole (PROTONIX) 40 MG tablet TAKE 1 TABLET BY MOUTH ONCE DAILY 05/13/14   Abner Greenspan, MD  traMADol (ULTRAM) 50 MG tablet Take 1 tablet by mouth as needed. 04/21/13   Historical Provider, MD  VIAGRA 50 MG tablet TAKE 1 TABLET BY MOUTH AS NEEDED FOR ERECTILE DYSFUNCTION AS DIRECTED BEFORE SEXUAL ACTIVITY 06/11/13   Abner Greenspan, MD   BP 166/103  Pulse 81  Temp(Src) 98 F (36.7 C)  Resp 16  Ht 5\' 7"  (1.702 m)  Wt 189 lb (85.73 kg)  BMI 29.59 kg/m2  SpO2 94% Physical Exam  Nursing note and vitals reviewed. Constitutional: He appears well-developed and well-nourished. No distress.  HENT:  Head: Normocephalic and atraumatic.  Eyes: Conjunctivae are normal.  Neck: Normal range of motion.  Cardiovascular: Normal rate and regular rhythm.  Exam reveals no gallop and no friction rub.   No murmur heard. Pulmonary/Chest: Effort normal  and breath sounds normal. He has no wheezes. He has no rales. He exhibits no tenderness.  Abdominal: Soft. There is no tenderness.  Musculoskeletal: Normal range of motion.  Full ROM of left index and middle fingers. No obvious deformity.   Neurological: He is alert. Coordination normal.  Sensation intact of distal fingers of left hand. Speech is goal-oriented. Moves limbs without ataxia.   Skin: Skin is warm and dry.  1.5 cm laceration of left index finger pad. 1.0cm laceration of left middle finger pad  Bleeding controlled. No foreign body.    Psychiatric: He has a normal mood and affect. His behavior is normal.    ED Course  NERVE BLOCK Date/Time: 05/13/2014 9:31 PM Performed by: Alvina Chou Authorized by: Alvina Chou Consent: Verbal consent obtained. Risks and benefits: risks, benefits and alternatives were discussed Consent given by: patient Patient understanding: patient states understanding of the procedure being performed Patient consent: the patient's understanding of the procedure matches consent given Patient identity confirmed: verbally with patient Time out: Immediately prior to procedure a "time out" was called to verify the correct patient, procedure, equipment, support staff and site/side marked as required. Indications: pain relief Body area: upper extremity Nerve: digital Laterality: left Patient sedated: no Preparation: Patient was prepped and draped in the usual sterile fashion. Patient position: sitting Needle gauge: 25 G Location technique: anatomical landmarks Local anesthetic: lidocaine 1% without epinephrine Anesthetic total: 4 ml Outcome: pain improved Patient tolerance: Patient tolerated the procedure well with no immediate complications.   (including critical care time) Labs Review Labs Reviewed - No data to display  LACERATION REPAIR Performed by: Alvina Chou Authorized by: Alvina Chou Consent: Verbal consent obtained. Risks and benefits: risks, benefits and alternatives were discussed Consent given by: patient Patient identity confirmed: provided demographic data Prepped and Draped in normal sterile fashion Wound explored  Laceration Location: left index finger pad  Laceration Length: 1.5 cm  No Foreign Bodies seen or palpated  Anesthesia: local infiltration  Local anesthetic: lidocaine 1% without epinephrine  Anesthetic total: 2 ml  Irrigation method: syringe Amount of cleaning: standard  Skin closure: 4-0 prolene  Number of sutures:  5  Technique: simple  Patient tolerance: Patient tolerated the procedure well with no immediate complications.  LACERATION REPAIR Performed by: Alvina Chou Authorized by: Alvina Chou Consent: Verbal consent obtained. Risks and benefits: risks, benefits and alternatives were discussed Consent given by: patient Patient identity confirmed: provided demographic data Prepped and Draped in normal sterile fashion Wound explored  Laceration Location: left middle finger pad  Laceration Length: 1 cm  No Foreign Bodies seen or palpated  Anesthesia: local infiltration  Local anesthetic: lidocaine 1% without epinephrine  Anesthetic total: 1 ml  Irrigation method: syringe Amount of cleaning: standard  Skin closure: 4-0 prolene  Number of sutures: 3  Technique: simple  Patient tolerance: Patient tolerated the procedure well with no immediate complications.   Imaging Review No results found.   EKG Interpretation None      MDM   Final diagnoses:  Laceration of left index finger w/o foreign body w/o damage to nail, initial encounter  Laceration of left middle finger w/o foreign body w/o damage to nail, initial encounter    9:28 PM Patient's laceration repaired without difficulty. Patient up to date Tdap. Vitals stable and patient afebrile.    Alvina Chou, [place] 05/14/14 [id number]

## 2014-05-13 NOTE — Telephone Encounter (Signed)
Pt dropped off health screen form to be completed and signed by Dr. Glori Bickers. Pt states he does not need a copy for himself but he would like [place] to fax it to the number on the form and scan into his chart.  Form in [place] inbox

## 2014-05-13 NOTE — Telephone Encounter (Signed)
Form in your inbox 

## 2014-05-13 NOTE — Discharge Instructions (Signed)
Keep wound area clean. Take Vicodin as needed for pain. Refer to attached documents for more information. Follow up with your doctor for suture removal.

## 2014-05-14 NOTE — ED Provider Notes (Signed)
Medical screening examination/treatment/procedure(s) were performed by non-physician practitioner and as supervising physician I was immediately available for consultation/collaboration.   EKG Interpretation None        Evelina Bucy, MD 05/14/14 1501

## 2014-05-15 NOTE — Telephone Encounter (Signed)
Done and in IN box 

## 2014-05-16 NOTE — Telephone Encounter (Signed)
Form faxed and left voicemail letting pt know form was faxed

## 2014-05-23 ENCOUNTER — Ambulatory Visit (INDEPENDENT_AMBULATORY_CARE_PROVIDER_SITE_OTHER): Payer: BC Managed Care – PPO | Admitting: Family Medicine

## 2014-05-23 ENCOUNTER — Encounter: Payer: Self-pay | Admitting: Family Medicine

## 2014-05-23 VITALS — BP 128/82 | HR 61 | Temp 98.2°F | Ht 67.0 in | Wt 193.2 lb

## 2014-05-23 DIAGNOSIS — S61412A Laceration without foreign body of left hand, initial encounter: Secondary | ICD-10-CM | POA: Insufficient documentation

## 2014-05-23 DIAGNOSIS — S61412S Laceration without foreign body of left hand, sequela: Secondary | ICD-10-CM

## 2014-05-23 DIAGNOSIS — F988 Other specified behavioral and emotional disorders with onset usually occurring in childhood and adolescence: Secondary | ICD-10-CM

## 2014-05-23 DIAGNOSIS — F909 Attention-deficit hyperactivity disorder, unspecified type: Secondary | ICD-10-CM

## 2014-05-23 MED ORDER — METHYLPHENIDATE HCL ER 20 MG PO TBCR: 20.0000 mg | EXTENDED_RELEASE_TABLET | Freq: Every day | ORAL | Status: DC

## 2014-05-23 NOTE — Progress Notes (Signed)
Pre visit review using our clinic review tool, if applicable. No additional management support is needed unless otherwise documented below in the visit note. 

## 2014-05-23 NOTE — Progress Notes (Signed)
Subjective:    Patient ID: Benjamin Bullock, male    DOB: 08-May-1951, 63 y.o.   MRN: [id number]  HPI Here for suture removal  L middle/index fingers  Had 5 sutures   Happened opening a bottle of wine - the neck shattered and the glass cut him   Also wants to start back on methylphenidate ER 20 mg - he thinks he needs it  Does concentrate better on it and performs better at work     Patient Active Problem List   Diagnosis Date Noted  . Laceration of hand, left 05/23/2014  . Anxiety 08/20/2013  . Colon cancer screening 03/31/2012  . Allergic drug reaction 01/29/2012  . Back pain 02/05/2011  . Routine general medical examination at a health care facility 11/08/2010  . Prostate cancer screening 11/08/2010  . GERD 09/20/2009  . ANXIETY STATE NOS 01/08/2007  . ADD 01/08/2007  . HYPERLIPIDEMIA 01/07/2007  . ERECTILE DYSFUNCTION 01/07/2007  . DEPRESSION 01/07/2007  . ALLERGIC RHINITIS 01/07/2007   Past Medical History  Diagnosis Date  . Allergy     allergic rhinitis  . Anxiety   . Depression   . ADD (attention deficit disorder)   . Hyperlipidemia   . Bursitis of hip, right   . History of meniscal tear     left knee meniscal tear   No past surgical history on file. History  Substance Use Topics  . Smoking status: Former Research scientist (life sciences)  . Smokeless tobacco: Not on file     Comment: Smoked for about one year 30 years ago  . Alcohol Use: Yes     Comment: occ   Family History  Problem Relation Age of Onset  . Hypertension Mother   . Depression Mother   . Heart disease Father     CAD  . Heart disease Paternal Uncle     CAD  . COPD Maternal Grandfather    Allergies  Allergen Reactions  . Aleve [Naproxen Sodium] Swelling    Face and lips swollen and hives  . Omeprazole     REACTION: not effective   Current Outpatient Prescriptions on File Prior to Visit  Medication Sig Dispense Refill  . ALPRAZolam (XANAX) 0.5 MG tablet Take 1 tablet (0.5 mg total) by mouth 2 (two)  times daily as needed for anxiety (for travel anxiety). 20 tablet 0  . CRESTOR 40 MG tablet TAKE 1 TABLET BY MOUTH EVERY DAY 30 tablet 3  . HYDROcodone-acetaminophen (NORCO/VICODIN) 5-325 MG per tablet Take 2 tablets by mouth every 4 (four) hours as needed for moderate pain or severe pain. 6 tablet 0  . pantoprazole (PROTONIX) 40 MG tablet TAKE 1 TABLET BY MOUTH ONCE DAILY 30 tablet 5  . traMADol (ULTRAM) 50 MG tablet Take 1 tablet by mouth as needed.    Marland Kitchen VIAGRA 50 MG tablet TAKE 1 TABLET BY MOUTH AS NEEDED FOR ERECTILE DYSFUNCTION AS DIRECTED BEFORE SEXUAL ACTIVITY 9 tablet 5   No current facility-administered medications on file prior to visit.        Review of Systems Review of Systems  Constitutional: Negative for fever, appetite change, fatigue and unexpected weight change.  Eyes: Negative for pain and visual disturbance.  Respiratory: Negative for cough and shortness of breath.   Cardiovascular: Negative for cp or palpitations    Gastrointestinal: Negative for nausea, diarrhea and constipation.  Genitourinary: Negative for urgency and frequency.  Skin: Negative for pallor or rash  pos for tenderness of fingers at site of lacerations  Neurological: Negative for weakness, light-headedness, numbness and headaches.  Hematological: Negative for adenopathy. Does not bruise/bleed easily.  Psychiatric/Behavioral: Negative for dysphoric mood. The patient is not nervous/anxious.  pos for decreased attentiveness and concentration        Objective:   Physical Exam  Constitutional: He appears well-developed and well-nourished. No distress.  HENT:  Head: Normocephalic and atraumatic.  Eyes: Conjunctivae and EOM are normal. Pupils are equal, round, and reactive to light.  Neck: Normal range of motion. Neck supple.  Lymphadenopathy:    He has no cervical adenopathy.  Neurological: He is alert. He has normal reflexes. No cranial nerve deficit. He exhibits normal muscle tone. Coordination  normal.  Skin: Skin is warm and dry. No rash noted. No erythema. No pallor.  2 lacerations on palmar surfaces of L index and middle fingers  Skin edges well approximated and no signs of infection  Simple interrupted sutures removed/wound cleaned and steri strips applied   Psychiatric: He has a normal mood and affect.          Assessment & Plan:   Problem List Items Addressed This Visit      Other   Attention deficit disorder    Pt decided to resume his methylphenidate - I agree he does better with this re: attentiveness Rev protocol for refills Px given for 30 today    Laceration of hand, left - Primary    Removed simple interrupted sutures from palmar surface of index and middle finger Skin edges well approximated  No s/s of infection Steri strips applied  Wound care directions and parameters for f/u disc- see AVS

## 2014-05-23 NOTE — Assessment & Plan Note (Signed)
Removed simple interrupted sutures from palmar surface of index and middle finger Skin edges well approximated  No s/s of infection Steri strips applied  Wound care directions and parameters for f/u disc- see AVS

## 2014-05-23 NOTE — Assessment & Plan Note (Signed)
Pt decided to resume his methylphenidate - I agree he does better with this re: attentiveness Rev protocol for refills Px given for 30 today

## 2014-05-23 NOTE — Patient Instructions (Signed)
Sutures are out  Keep wounds clean with soap and water  Do not submerge for another week  If red/swollen or any other change let me know  The steri strips will come off when they are ready   Let me know if any problems

## 2014-06-06 ENCOUNTER — Telehealth: Payer: Self-pay | Admitting: *Deleted

## 2014-06-06 NOTE — Telephone Encounter (Signed)
Fax from pharmacy saying PA required, PA form is being faxed to me from pt's insurance

## 2014-06-06 NOTE — Telephone Encounter (Signed)
PA form received and placed in your inbox

## 2014-06-07 NOTE — Telephone Encounter (Signed)
Done and in IN box 

## 2014-06-08 NOTE — Telephone Encounter (Signed)
PA approved, pt and pharmacy notified, letter placed in Dr. Marliss Coots inbox for signing and scanning

## 2014-06-08 NOTE — Telephone Encounter (Signed)
PA faxed

## 2014-06-13 ENCOUNTER — Other Ambulatory Visit: Payer: Self-pay | Admitting: Family Medicine

## 2014-11-04 ENCOUNTER — Encounter: Payer: Self-pay | Admitting: Family Medicine

## 2014-11-04 ENCOUNTER — Ambulatory Visit (INDEPENDENT_AMBULATORY_CARE_PROVIDER_SITE_OTHER): Payer: BLUE CROSS/BLUE SHIELD | Admitting: Family Medicine

## 2014-11-04 VITALS — BP 110/70 | HR 68 | Temp 98.2°F | Ht 67.0 in | Wt 188.8 lb

## 2014-11-04 DIAGNOSIS — F909 Attention-deficit hyperactivity disorder, unspecified type: Secondary | ICD-10-CM

## 2014-11-04 DIAGNOSIS — E785 Hyperlipidemia, unspecified: Secondary | ICD-10-CM

## 2014-11-04 DIAGNOSIS — J302 Other seasonal allergic rhinitis: Secondary | ICD-10-CM | POA: Diagnosis not present

## 2014-11-04 DIAGNOSIS — R5382 Chronic fatigue, unspecified: Secondary | ICD-10-CM | POA: Diagnosis not present

## 2014-11-04 DIAGNOSIS — F988 Other specified behavioral and emotional disorders with onset usually occurring in childhood and adolescence: Secondary | ICD-10-CM

## 2014-11-04 MED ORDER — METHYLPHENIDATE HCL ER 20 MG PO TBCR: 20.0000 mg | EXTENDED_RELEASE_TABLET | Freq: Every day | ORAL | Status: DC

## 2014-11-04 MED ORDER — FLUTICASONE PROPIONATE 50 MCG/ACT NA SUSP: 2.0000 | Freq: Every day | NASAL | Status: DC

## 2014-11-04 NOTE — Progress Notes (Signed)
Subjective:    Patient ID: Benjamin Bullock, male    DOB: 21-Jul-1951, 64 y.o.   MRN: [id number]  HPI Here for several issues and chronic problems   He has been tired for a while  Less than 6 months  Sleeps ok - goes to bed 10 pm - wakes up at 2 and goes back to sleep at 5-6  Sometimes preoccupied and worried  Is fairly refreshed  He does snore a bit -no witnessed apnea  Does not think he has sleep apnea     Some allergies -nasal  Symptoms -congestion and sneezing and runny nose  Clear discharge  He tried flonase and it works fairly well  Has not tried oral antihistamines (claritin in the past)  No headaches or wheezing   He needs refill of his ADD med -metadate  Is helpful   Walks intermittently - does have hip problems    Chemistry      Component Value Date/Time   NA 138 03/31/2014 0829   K 4.2 03/31/2014 0829   CL 104 03/31/2014 0829   CO2 27 03/31/2014 0829   BUN 14 03/31/2014 0829   CREATININE 0.9 03/31/2014 0829      Component Value Date/Time   CALCIUM 9.0 03/31/2014 0829   ALKPHOS 48 03/31/2014 0829   AST 25 03/31/2014 0829   ALT 32 03/31/2014 0829   BILITOT 0.6 03/31/2014 0829      Lab Results  Component Value Date   WBC 4.5 03/31/2014   HGB 15.0 03/31/2014   HCT 44.4 03/31/2014   MCV 90.6 03/31/2014   PLT 192.0 03/31/2014    Lab Results  Component Value Date   TSH 2.98 03/31/2014   Lab Results  Component Value Date   CHOL 163 03/31/2014   HDL 44.30 03/31/2014   LDLCALC 98 03/31/2014   LDLDIRECT 130.9 10/25/2008   TRIG 105.0 03/31/2014   CHOLHDL 4 03/31/2014    (crestor and diet)   Patient Active Problem List   Diagnosis Date Noted  . Fatigue 11/06/2014  . Laceration of hand, left 05/23/2014  . Anxiety 08/20/2013  . Colon cancer screening 03/31/2012  . Allergic drug reaction 01/29/2012  . Back pain 02/05/2011  . Routine general medical examination at a health care facility 11/08/2010  . Prostate cancer screening 11/08/2010  . GERD  09/20/2009  . ANXIETY STATE NOS 01/08/2007  . Attention deficit disorder 01/08/2007  . Hyperlipidemia 01/07/2007  . ERECTILE DYSFUNCTION 01/07/2007  . DEPRESSION 01/07/2007  . Allergic rhinitis 01/07/2007   Past Medical History  Diagnosis Date  . Allergy     allergic rhinitis  . Anxiety   . Depression   . ADD (attention deficit disorder)   . Hyperlipidemia   . Bursitis of hip, right   . History of meniscal tear     left knee meniscal tear   No past surgical history on file. History  Substance Use Topics  . Smoking status: Former Research scientist (life sciences)  . Smokeless tobacco: Not on file     Comment: Smoked for about one year 30 years ago  . Alcohol Use: Yes     Comment: occ   Family History  Problem Relation Age of Onset  . Hypertension Mother   . Depression Mother   . Heart disease Father     CAD  . Heart disease Paternal Uncle     CAD  . COPD Maternal Grandfather    Allergies  Allergen Reactions  . Aleve [Naproxen Sodium] Swelling  Face and lips swollen and hives  . Omeprazole     REACTION: not effective   Current Outpatient Prescriptions on File Prior to Visit  Medication Sig Dispense Refill  . ALPRAZolam (XANAX) 0.5 MG tablet Take 1 tablet (0.5 mg total) by mouth 2 (two) times daily as needed for anxiety (for travel anxiety). 20 tablet 0  . CRESTOR 40 MG tablet TAKE 1 TABLET BY MOUTH EVERY DAY 30 tablet 5  . HYDROcodone-acetaminophen (NORCO/VICODIN) 5-325 MG per tablet Take 2 tablets by mouth every 4 (four) hours as needed for moderate pain or severe pain. 6 tablet 0  . pantoprazole (PROTONIX) 40 MG tablet TAKE 1 TABLET BY MOUTH ONCE DAILY 30 tablet 5  . traMADol (ULTRAM) 50 MG tablet Take 1 tablet by mouth as needed.    Marland Kitchen VIAGRA 50 MG tablet TAKE 1 TABLET BY MOUTH AS NEEDED FOR ERECTILE DYSFUNCTION AS DIRECTED BEFORE SEXUAL ACTIVITY 9 tablet 5   No current facility-administered medications on file prior to visit.    Review of Systems Review of Systems  Constitutional:  Negative for fever, appetite change, and unexpected weight change. pos for fatigue  Eyes: Negative for pain and visual disturbance.  ENT pos for cong and rhinorrhea and sneezing /neg for sinus pain or ST Respiratory: Negative for cough and shortness of breath.   Cardiovascular: Negative for cp or palpitations    Gastrointestinal: Negative for nausea, diarrhea and constipation.  Genitourinary: Negative for urgency and frequency.  Skin: Negative for pallor or rash   Neurological: Negative for weakness, light-headedness, numbness and headaches.  Hematological: Negative for adenopathy. Does not bruise/bleed easily.  Psychiatric/Behavioral: Negative for dysphoric mood. The patient is not nervous/anxious.  pos for stressors and also inattentiveness        Objective:   Physical Exam  Constitutional: He appears well-developed and well-nourished. No distress.  HENT:  Head: Normocephalic and atraumatic.  Right Ear: External ear normal.  Left Ear: External ear normal.  Mouth/Throat: Oropharynx is clear and moist.  Nares are pale and boggy with clear rhinorrhea No sinus tenderness Throat clear   Eyes: Conjunctivae and EOM are normal. Pupils are equal, round, and reactive to light. Right eye exhibits no discharge. Left eye exhibits no discharge.  Neck: Normal range of motion. Neck supple. Carotid bruit is not present.  Cardiovascular: Normal rate, regular rhythm and normal heart sounds.   Pulmonary/Chest: Effort normal and breath sounds normal. No respiratory distress. He has no wheezes.  Abdominal: Soft. Bowel sounds are normal.  Musculoskeletal: He exhibits no edema.  Lymphadenopathy:    He has no cervical adenopathy.  Neurological: He is alert. He has normal reflexes. No cranial nerve deficit. He exhibits normal muscle tone. Coordination normal.  Skin: Skin is warm and dry. No rash noted. No erythema. No pallor.  Psychiatric: His mood appears anxious. His affect is not blunt, not labile and  not inappropriate. His speech is tangential. Thought content is not paranoid. He does not exhibit a depressed mood. He expresses no homicidal and no suicidal ideation.  Baseline- trouble focusing  Tangential  Slt anxious  He is inattentive.          Assessment & Plan:   Problem List Items Addressed This Visit      Respiratory   Allergic rhinitis    Struggling during pollen season  Enc to continue flonase  Also add claritin 10 mg daily  Disc allergen avoidance          Other   Attention deficit  disorder    Refill metadate ER which has been helpful Noted less fatigued when compliant with this as well       Fatigue    Likely multifactorial  Pt thinks allergies play a role  Better on ADD med Dose snore-disc poss of sleep apnea  Will continue to follow       Hyperlipidemia - Primary    Refill crestor which works well  Disc goals for lipids and reasons to control them Rev labs with pt from last check  Rev low sat fat diet in detail

## 2014-11-04 NOTE — Patient Instructions (Addendum)
Keep walking if it does not bother your hip too much (follow up with Dr Maureen Ralphs if you need more specific questions answered)  Lower impact exercise like swimming or biking may feel better  Try flonase for nasal allergies  If that is not sufficient - add on claritin 10 mg one per day  If fatigue gets worse -we may consider an evaluation for sleep apnea

## 2014-11-04 NOTE — Progress Notes (Signed)
Pre visit review using our clinic review tool, if applicable. No additional management support is needed unless otherwise documented below in the visit note. 

## 2014-11-06 DIAGNOSIS — R5383 Other fatigue: Secondary | ICD-10-CM | POA: Insufficient documentation

## 2014-11-06 NOTE — Assessment & Plan Note (Signed)
Struggling during pollen season  Enc to continue flonase  Also add claritin 10 mg daily  Disc allergen avoidance

## 2014-11-06 NOTE — Assessment & Plan Note (Signed)
Likely multifactorial  Pt thinks allergies play a role  Better on ADD med Dose snore-disc poss of sleep apnea  Will continue to follow

## 2014-11-06 NOTE — Assessment & Plan Note (Signed)
Refill crestor which works well  Disc goals for lipids and reasons to control them Rev labs with pt from last check  Rev low sat fat diet in detail

## 2014-11-06 NOTE — Assessment & Plan Note (Signed)
Refill metadate ER which has been helpful Noted less fatigued when compliant with this as well

## 2014-11-07 ENCOUNTER — Other Ambulatory Visit: Payer: Self-pay | Admitting: Family Medicine

## 2014-11-07 NOTE — Telephone Encounter (Signed)
Received refill request electronically from pharmacy. See allergy/contraindication. Is it okay to refill medication? 

## 2014-11-07 NOTE — Telephone Encounter (Signed)
Will refill electronically  

## 2014-12-15 ENCOUNTER — Other Ambulatory Visit: Payer: Self-pay | Admitting: Family Medicine

## 2015-01-27 ENCOUNTER — Encounter: Payer: Self-pay | Admitting: Family Medicine

## 2015-01-27 ENCOUNTER — Ambulatory Visit (INDEPENDENT_AMBULATORY_CARE_PROVIDER_SITE_OTHER): Payer: BLUE CROSS/BLUE SHIELD | Admitting: Family Medicine

## 2015-01-27 VITALS — BP 124/78 | HR 64 | Temp 98.2°F | Ht 67.0 in | Wt 188.2 lb

## 2015-01-27 DIAGNOSIS — F419 Anxiety disorder, unspecified: Secondary | ICD-10-CM | POA: Diagnosis not present

## 2015-01-27 MED ORDER — SILDENAFIL CITRATE 20 MG PO TABS: ORAL_TABLET | ORAL | Status: DC

## 2015-01-27 MED ORDER — BUSPIRONE HCL 15 MG PO TABS: 7.5000 mg | ORAL_TABLET | Freq: Two times a day (BID) | ORAL | Status: DC

## 2015-01-27 MED ORDER — METHYLPHENIDATE HCL ER 20 MG PO TBCR: 20.0000 mg | EXTENDED_RELEASE_TABLET | Freq: Every day | ORAL | Status: DC

## 2015-01-27 NOTE — Patient Instructions (Addendum)
Try buspirone 1/2 pill twice daily  See the handout I gave you  If any intolerable side effects please let me know (or if you feels worse instead of better)  For erectile dysfunction you can try the generic (viagra) medicine I still think could benefit from counseling (a different counselor)    Follow up with me in about 4-6 weeks

## 2015-01-27 NOTE — Progress Notes (Signed)
Pre visit review using our clinic review tool, if applicable. No additional management support is needed unless otherwise documented below in the visit note. 

## 2015-01-27 NOTE — Progress Notes (Signed)
Subjective:    Patient ID: Benjamin Bullock, male    DOB: [birth year], 64 y.o.   MRN: [id number]  HPI Here for mood/anxiety   Wife and son are noticing more  Anxiety runs in the family - enc by family to get treated   He has some OCD behaviors - usually work related (checking for errors)   He worries a lot / has to get up and go to the office to check on things in the middle of the night  Worry makes him feel jittery/uneasy/panicy -very unpleasant  Feels like he overreacts to things  Time off helps - but he takes a while to wind down  Running used to help - chronic hip pain has stopped him from doing that   No compulsive behaviors not related to work  [employer] intrusive thoughts - involving the worries  Financial worries   No anhedonia  occ perhaps sad (not really hopeless)  Has adapted some coping stategies (but doing more work with a partner) 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 Fatigue 11/06/2014  . Laceration of hand, left 05/23/2014  . Anxiety 08/20/2013  . Colon cancer screening 03/31/2012  . Allergic drug reaction 01/29/2012  . Back pain 02/05/2011  . Routine general medical examination at a health care facility 11/08/2010  . Prostate cancer screening 11/08/2010  . GERD 09/20/2009  . ANXIETY STATE NOS 01/08/2007  . Attention deficit disorder 01/08/2007  . Hyperlipidemia 01/07/2007  . ERECTILE DYSFUNCTION 01/07/2007  . DEPRESSION 01/07/2007  . Allergic rhinitis 01/07/2007   Past Medical History  Diagnosis Date  . Allergy     allergic  rhinitis  . Anxiety   . Depression   . ADD (attention deficit disorder)   . Hyperlipidemia   . Bursitis of hip, right   . History of meniscal tear     left knee meniscal tear   No past surgical history on file. History  Substance Use Topics  . Smoking status: Former Research scientist (life sciences)  . Smokeless tobacco: Not on file     Comment: Smoked for about one year 30 years ago  . Alcohol Use: 0.0 oz/week    0 Standard drinks or equivalent per week     Comment: occ   Family History  Problem Relation Age of Onset  . Hypertension Mother   . Depression Mother   . Heart disease Father     CAD  . Heart disease Paternal Uncle     CAD  . COPD Maternal Grandfather    Allergies  Allergen Reactions  . Aleve [Naproxen Sodium] Swelling    Face and lips swollen and hives  . Omeprazole     REACTION: not effective   Current Outpatient Prescriptions on File Prior to Visit  Medication Sig Dispense Refill  . CRESTOR 40 MG tablet TAKE 1 TABLET BY MOUTH EVERY DAY 30 tablet 5  . methylphenidate (METADATE ER) 20 MG ER tablet Take 1 tablet (20  mg total) by mouth daily. 30 tablet 0  . pantoprazole (PROTONIX) 40 MG tablet TAKE 1 TABLET BY MOUTH ONCE DAILY 30 tablet 11  . traMADol (ULTRAM) 50 MG tablet Take 1 tablet by mouth as needed.    Marland Kitchen VIAGRA 50 MG tablet TAKE 1 TABLET BY MOUTH AS NEEDED FOR ERECTILE DYSFUNCTION AS DIRECTED BEFORE SEXUAL ACTIVITY 9 tablet 5   No current facility-administered medications on file prior to visit.    Review of Systems Review of Systems  Constitutional: Negative for fever, appetite change, fatigue and unexpected weight change.  Eyes: Negative for pain and visual disturbance.  Respiratory: Negative for cough and shortness of breath.   Cardiovascular: Negative for cp or palpitations    Gastrointestinal: Negative for nausea, diarrhea and constipation.  Genitourinary: Negative for urgency and frequency.  Skin: Negative for pallor or rash   MSK pos for chronic hip pain    Neurological: Negative for weakness, light-headedness, numbness and headaches.  Hematological: Negative for adenopathy. Does not bruise/bleed easily.  Psychiatric/Behavioral: pos for some symptoms of anxiety and depression with concentration problems  Neg for si.         Objective:   Physical Exam  Constitutional: He is oriented to person, place, and time. He appears well-developed and well-nourished. No distress.  Well appearing   HENT:  Head: Normocephalic and atraumatic.  Eyes: Conjunctivae and EOM are normal. Pupils are equal, round, and reactive to light. No scleral icterus.  Neck: Normal range of motion. Neck supple.  Cardiovascular: Normal rate and regular rhythm.   Lymphadenopathy:    He has no cervical adenopathy.  Neurological: He is alert and oriented to person, place, and time.  Skin: No rash noted.  Psychiatric: Judgment normal. His mood appears anxious. His affect is not blunt, not labile and not inappropriate. His speech is rapid and/or pressured. He is hyperactive. Thought content is not paranoid and not delusional. Cognition and memory are normal. He does not exhibit a depressed mood. He expresses no homicidal and no suicidal ideation.  Pleasant and talkative  Generally anxious with pressured speech at times (baseline for him) Is attentive today - has to make an effort  Speaks openly about stressors  He is attentive.          Assessment & Plan:   Problem List Items Addressed This Visit    Anxiety - Primary    Long disc about anxiety and how it adds to /plays off of ADD Reviewed stressors/ coping techniques/symptoms/ support sources/ tx options and side effects in detail today  Work is main source of stress Recommend counseling, pt declines due to a bad exp with it in the past -urged to re consider Trial of buspar Discussed expectations of SSRI medication including time to effectiveness and mechanism of action, also poss of side effects (early and late)-  including mental fuzziness, weight or appetite change, nausea and poss of worse dep or anxiety (even suicidal thoughts)  Pt voiced understanding and will stop med and update if this occurs   Disc exercise (low impact) and also the benefits of mediation/mindfullness training in the future as well F/u planned  >25 minutes spent in face to face time with patient, >50% spent in counselling or coordination of care       Relevant Medications   busPIRone (BUSPAR) 15 MG tablet

## 2015-01-28 NOTE — Assessment & Plan Note (Signed)
Long disc about anxiety and how it adds to /plays off of ADD Reviewed stressors/ coping techniques/symptoms/ support sources/ tx options and side effects in detail today  Work is main source of stress Recommend counseling, pt declines due to a bad exp with it in the past -urged to re consider Trial of buspar Discussed expectations of SSRI medication including time to effectiveness and mechanism of action, also poss of side effects (early and late)- including mental fuzziness, weight or appetite change, nausea and poss of worse dep or anxiety (even suicidal thoughts)  Pt voiced understanding and will stop med and update if this occurs   Disc exercise (low impact) and also the benefits of mediation/mindfullness training in the future as well F/u planned  >25 minutes spent in face to face time with patient, >50% spent in counselling or coordination of care

## 2015-02-08 ENCOUNTER — Other Ambulatory Visit: Payer: Self-pay | Admitting: Family Medicine

## 2015-02-09 NOTE — Telephone Encounter (Signed)
Electronic refill request, pt has f/u scheduled on 03/01/15 and a CPE scheduled on 04/10/15, Rx not on med list and last refilled on 04/04/14 #20 with 0 additional refills, please advise

## 2015-02-09 NOTE — Telephone Encounter (Signed)
Px written for call in   Can put back on med list

## 2015-02-09 NOTE — Telephone Encounter (Signed)
Rx called in as prescribed 

## 2015-03-01 ENCOUNTER — Ambulatory Visit: Payer: BLUE CROSS/BLUE SHIELD | Admitting: Family Medicine

## 2015-03-03 ENCOUNTER — Ambulatory Visit: Payer: BLUE CROSS/BLUE SHIELD | Admitting: Family Medicine

## 2015-03-07 ENCOUNTER — Encounter: Payer: Self-pay | Admitting: Family Medicine

## 2015-03-07 ENCOUNTER — Ambulatory Visit (INDEPENDENT_AMBULATORY_CARE_PROVIDER_SITE_OTHER): Payer: BLUE CROSS/BLUE SHIELD | Admitting: Family Medicine

## 2015-03-07 VITALS — BP 106/62 | HR 69 | Temp 98.8°F | Ht 67.0 in | Wt 194.0 lb

## 2015-03-07 DIAGNOSIS — F419 Anxiety disorder, unspecified: Secondary | ICD-10-CM

## 2015-03-07 NOTE — Assessment & Plan Note (Signed)
Ongoing - pt has some improvement with buspar so far but not taking it regularly  Rev poss side eff- I feel if he takes it regularly he will have better results , and if he does have a side eff simply to let me know  Will not raise dose today Reviewed stressors/ coping techniques/symptoms/ support sources/ tx options and side effects in detail today Again suggested counseling  Also disc the practice of meditation for mindfulness to help with both his anxiety and ADD symptoms

## 2015-03-07 NOTE — Progress Notes (Signed)
Pre visit review using our clinic review tool, if applicable. No additional management support is needed unless otherwise documented below in the visit note. 

## 2015-03-07 NOTE — Patient Instructions (Signed)
Make an appt with Dr Lorelei Pont on the way out for hip pain  Continue buspar - take 1/2 pill twice daily - if you have side effects please stop it and let me know

## 2015-03-07 NOTE — Progress Notes (Signed)
Subjective:    Patient ID: Benjamin Bullock, male    DOB: 27-Nov-1950, 64 y.o.   MRN: [id number]  HPI Here for f/u of anxiety   Thinks "it's better actually"  Takes 1/2 pill daily - then occ forgets in the afternoon  Not a lot of side effects  Erections are not the best - but they were not to begin with   Unsure if family has noticed a difference   Is able to focus better - put aside the obsessive /compulsive thoughts  Not rushing to the office in the middle of the night     In fact - his bp is improved today as well  He is working out more also - stationary bike / walking    Has gained 5-6 lb however- ? From medicine  Has been eating potato chips more lately  A lot of seafood   Still having problems with R hip  Has done PT - gave up because it hurt  Has had bursitis and also ? Arthritis  Has seen Dr Kandis Mannan ready for hip replacement   Patient Active Problem List   Diagnosis Date Noted  . Fatigue 11/06/2014  . Laceration of hand, left 05/23/2014  . Anxiety 08/20/2013  . Colon cancer screening 03/31/2012  . Allergic drug reaction 01/29/2012  . Back pain 02/05/2011  . Routine general medical examination at a health care facility 11/08/2010  . Prostate cancer screening 11/08/2010  . GERD 09/20/2009  . ANXIETY STATE NOS 01/08/2007  . Attention deficit disorder 01/08/2007  . Hyperlipidemia 01/07/2007  . ERECTILE DYSFUNCTION 01/07/2007  . DEPRESSION 01/07/2007  . Allergic rhinitis 01/07/2007   Past Medical History  Diagnosis Date  . Allergy     allergic rhinitis  . Anxiety   . Depression   . ADD (attention deficit disorder)   . Hyperlipidemia   . Bursitis of hip, right   . History of meniscal tear     left knee meniscal tear   No past surgical history on file. Social History  Substance Use Topics  . Smoking status: Former Research scientist (life sciences)  . Smokeless tobacco: None     Comment: Smoked for about one year 30 years ago  . Alcohol Use: 0.0 oz/week    0 Standard  drinks or equivalent per week     Comment: occ   Family History  Problem Relation Age of Onset  . Hypertension Mother   . Depression Mother   . Heart disease Father     CAD  . Heart disease Paternal Uncle     CAD  . COPD Maternal Grandfather    Allergies  Allergen Reactions  . Aleve [Naproxen Sodium] Swelling    Face and lips swollen and hives  . Omeprazole     REACTION: not effective   Current Outpatient Prescriptions on File Prior to Visit  Medication Sig Dispense Refill  . busPIRone (BUSPAR) 15 MG tablet Take 0.5 tablets (7.5 mg total) by mouth 2 (two) times daily. 30 tablet 5  . CRESTOR 40 MG tablet TAKE 1 TABLET BY MOUTH EVERY DAY 30 tablet 5  . methylphenidate (METADATE ER) 20 MG ER tablet Take 1 tablet (20 mg total) by mouth daily. 30 tablet 0  . pantoprazole (PROTONIX) 40 MG tablet TAKE 1 TABLET BY MOUTH ONCE DAILY 30 tablet 11  . sildenafil (REVATIO) 20 MG tablet Take 3 pills by mouth as needed 30 minutes before sexual activity 30 tablet 1  . traMADol (ULTRAM) 50 MG tablet Take  1 tablet by mouth as needed.     No current facility-administered medications on file prior to visit.     Review of Systems    Review of Systems  Constitutional: Negative for fever, appetite change, fatigue and unexpected weight change.  Eyes: Negative for pain and visual disturbance.  Respiratory: Negative for cough and shortness of breath.   Cardiovascular: Negative for cp or palpitations    Gastrointestinal: Negative for nausea, diarrhea and constipation.  Genitourinary: Negative for urgency and frequency.  Skin: Negative for pallor or rash   MSK pos for acute on chronic R hip pain with hx of deg change and bursitis  Neurological: Negative for weakness, light-headedness, numbness and headaches.  Hematological: Negative for adenopathy. Does not bruise/bleed easily.  Psychiatric/Behavioral: Negative for dysphoric mood. The patient is nervous/anxious.  pos for problems with concentration  -improved by medication , neg for SI    Objective:   Physical Exam  Constitutional: He appears well-developed and well-nourished. No distress.  overwt and well appearing   HENT:  Head: Normocephalic and atraumatic.  Eyes: Conjunctivae and EOM are normal. Pupils are equal, round, and reactive to light. No scleral icterus.  Neck: Normal range of motion. Neck supple.  Cardiovascular: Normal rate and regular rhythm.   Pulmonary/Chest: Effort normal and breath sounds normal.  Lymphadenopathy:    He has no cervical adenopathy.  Neurological: He is alert. He has normal reflexes. He displays no tremor.  Skin: Skin is warm and dry.  Psychiatric: Judgment normal. His mood appears anxious. His affect is not blunt, not labile and not inappropriate. His speech is rapid and/or pressured. He is not agitated, not aggressive, not slowed and not withdrawn. Thought content is not paranoid. Cognition and memory are normal. He does not exhibit a depressed mood. He expresses no homicidal and no suicidal ideation.  Anxiety is improved overall  Concentration is fair  Pt has at times rapid speech and changes the subject frequently - with a hard time staying focused   (this is his baseline however) Very pleasant           Assessment & Plan:   Problem List Items Addressed This Visit    Anxiety - Primary    Ongoing - pt has some improvement with buspar so far but not taking it regularly  Rev poss side eff- I feel if he takes it regularly he will have better results , and if he does have a side eff simply to let me know  Will not raise dose today Reviewed stressors/ coping techniques/symptoms/ support sources/ tx options and side effects in detail today Again suggested counseling  Also disc the practice of meditation for mindfulness to help with both his anxiety and ADD symptoms

## 2015-04-02 ENCOUNTER — Telehealth: Payer: Self-pay | Admitting: Family Medicine

## 2015-04-02 DIAGNOSIS — E785 Hyperlipidemia, unspecified: Secondary | ICD-10-CM

## 2015-04-02 DIAGNOSIS — Z0000 Encounter for general adult medical examination without abnormal findings: Secondary | ICD-10-CM

## 2015-04-02 DIAGNOSIS — Z125 Encounter for screening for malignant neoplasm of prostate: Secondary | ICD-10-CM

## 2015-04-02 NOTE — Telephone Encounter (Signed)
-----   Message from Ellamae Sia sent at 03/30/2015 12:03 PM EDT ----- Regarding: Lab orders for Thursday, 9.15.16 Patient is scheduled for CPX labs, please order future labs, Thanks , Karna Christmas

## 2015-04-06 ENCOUNTER — Other Ambulatory Visit (INDEPENDENT_AMBULATORY_CARE_PROVIDER_SITE_OTHER): Payer: BLUE CROSS/BLUE SHIELD

## 2015-04-06 DIAGNOSIS — E785 Hyperlipidemia, unspecified: Secondary | ICD-10-CM

## 2015-04-06 DIAGNOSIS — Z0000 Encounter for general adult medical examination without abnormal findings: Secondary | ICD-10-CM

## 2015-04-06 DIAGNOSIS — Z125 Encounter for screening for malignant neoplasm of prostate: Secondary | ICD-10-CM

## 2015-04-06 LAB — CBC WITH DIFFERENTIAL/PLATELET
Basophils Absolute: 0 10*3/uL (ref 0.0–0.1)
Basophils Relative: 0.9 % (ref 0.0–3.0)
Eosinophils Absolute: 0 10*3/uL (ref 0.0–0.7)
Eosinophils Relative: 0.9 % (ref 0.0–5.0)
HCT: 43.4 % (ref 39.0–52.0)
Hemoglobin: 14.7 g/dL (ref 13.0–17.0)
Lymphocytes Relative: 28.8 % (ref 12.0–46.0)
Lymphs Abs: 1.3 10*3/uL (ref 0.7–4.0)
MCHC: 33.8 g/dL (ref 30.0–36.0)
MCV: 89.9 fl (ref 78.0–100.0)
Monocytes Absolute: 0.4 10*3/uL (ref 0.1–1.0)
Monocytes Relative: 9.1 % (ref 3.0–12.0)
Neutro Abs: 2.7 10*3/uL (ref 1.4–7.7)
Neutrophils Relative %: 60.3 % (ref 43.0–77.0)
Platelets: 191 10*3/uL (ref 150.0–400.0)
RBC: 4.83 Mil/uL (ref 4.22–5.81)
RDW: 14.2 % (ref 11.5–15.5)
WBC: 4.4 10*3/uL (ref 4.0–10.5)

## 2015-04-06 LAB — COMPREHENSIVE METABOLIC PANEL
ALT: 30 U/L (ref 0–53)
AST: 23 U/L (ref 0–37)
Albumin: 4.1 g/dL (ref 3.5–5.2)
Alkaline Phosphatase: 52 U/L (ref 39–117)
BUN: 13 mg/dL (ref 6–23)
CO2: 28 mEq/L (ref 19–32)
Calcium: 9 mg/dL (ref 8.4–10.5)
Chloride: 104 mEq/L (ref 96–112)
Creatinine, Ser: 0.85 mg/dL (ref 0.40–1.50)
GFR: 95.66 mL/min (ref >60.00)
Glucose, Bld: 102 mg/dL — ABNORMAL HIGH (ref 70–99)
Potassium: 4.4 mEq/L (ref 3.5–5.1)
Sodium: 139 mEq/L (ref 135–145)
Total Bilirubin: 0.8 mg/dL (ref 0.2–1.2)
Total Protein: 6.3 g/dL (ref 6.0–8.3)

## 2015-04-06 LAB — LIPID PANEL
Cholesterol: 163 mg/dL (ref 0–200)
HDL: 48.2 mg/dL (ref >39.00)
LDL Cholesterol: 93 mg/dL (ref 0–99)
NonHDL: 114.85
Total CHOL/HDL Ratio: 3
Triglycerides: 111 mg/dL (ref 0.0–149.0)
VLDL: 22.2 mg/dL (ref 0.0–40.0)

## 2015-04-06 LAB — TSH: TSH: 3.45 u[IU]/mL (ref 0.35–4.50)

## 2015-04-06 LAB — PSA: PSA: 2.39 ng/mL (ref 0.10–4.00)

## 2015-04-10 ENCOUNTER — Encounter: Payer: Self-pay | Admitting: Family Medicine

## 2015-04-10 ENCOUNTER — Ambulatory Visit (INDEPENDENT_AMBULATORY_CARE_PROVIDER_SITE_OTHER): Payer: BLUE CROSS/BLUE SHIELD | Admitting: Family Medicine

## 2015-04-10 VITALS — BP 122/66 | HR 72 | Temp 98.8°F | Ht 67.0 in | Wt 186.5 lb

## 2015-04-10 DIAGNOSIS — Z23 Encounter for immunization: Secondary | ICD-10-CM

## 2015-04-10 DIAGNOSIS — E785 Hyperlipidemia, unspecified: Secondary | ICD-10-CM

## 2015-04-10 DIAGNOSIS — Z1211 Encounter for screening for malignant neoplasm of colon: Secondary | ICD-10-CM

## 2015-04-10 DIAGNOSIS — Z125 Encounter for screening for malignant neoplasm of prostate: Secondary | ICD-10-CM

## 2015-04-10 DIAGNOSIS — Z0000 Encounter for general adult medical examination without abnormal findings: Secondary | ICD-10-CM

## 2015-04-10 MED ORDER — METHYLPHENIDATE HCL ER 20 MG PO TBCR: 20.0000 mg | EXTENDED_RELEASE_TABLET | Freq: Every day | ORAL | Status: DC

## 2015-04-10 NOTE — Assessment & Plan Note (Signed)
Reviewed health habits including diet and exercise and skin cancer prevention Reviewed appropriate screening tests for age  Also reviewed health mt list, fam hx and immunization status , as well as social and family history   See HPI Labs reviewed  prevnar vaccine today  Flu shot today  Please do stool kit for screening  Call us when you are ready for a colonoscopy referral  Take care of yourself  Keep exercising  

## 2015-04-10 NOTE — Assessment & Plan Note (Signed)
Disc goals for lipids and reasons to control them Rev labs with pt Rev low sat fat diet in detail Well controlled with crestor

## 2015-04-10 NOTE — Patient Instructions (Signed)
prevnar vaccine today  Flu shot today  Please do stool kit for screening  Call us when you are ready for a colonoscopy referral  Take care of yourself  Keep exercising

## 2015-04-10 NOTE — Progress Notes (Signed)
Pre visit review using our clinic review tool, if applicable. No additional management support is needed unless otherwise documented below in the visit note. 

## 2015-04-10 NOTE — Progress Notes (Signed)
Subjective:    Patient ID: Benjamin Bullock, male    DOB: 07/26/1950, 64 y.o.   MRN: [id number]  HPI Here for health maintenance exam and to review chronic medical problems    Doing better overall   Wt is down 8 lb with bmi of 29 He is working on it - walking more /(hip is bothering him less)  Happy about that  Enjoying 2 mo old grandchild as well   BP Readings from Last 3 Encounters:  04/10/15 122/66  03/07/15 106/62  01/27/15 124/78     Hep C screening - declines - not high risk   Flu shot -got it today   PNA 23 vaccine 9/15 , will get prevnar   Colon cancer screening -has prev declined colonoscopy Will do ifob kit  Will call when he is ready to get a colonoscopy   Td 9/12  Zoster vaccine 11/14   Prostate cancer screening Lab Results  Component Value Date   PSA 2.39 04/06/2015   PSA 1.54 03/31/2014   PSA 1.62 05/12/2013   does have ED-takes generic sildenafil  No prostate problems -nocturia happens if he drinks a lot of water during the day (2 times or less) Stream is the same overall , can empty bladder without problems  Drinks a lot of water  No prostate cancer in the family    Mood On buspar - is doing better overall /and also sleeping well  Less anxiety   Hyperlipidemia Lab Results  Component Value Date   CHOL 163 04/06/2015   CHOL 163 03/31/2014   CHOL 198 05/12/2013   Lab Results  Component Value Date   HDL 48.20 04/06/2015   HDL 44.30 03/31/2014   HDL 55.80 05/12/2013   Lab Results  Component Value Date   LDLCALC 93 04/06/2015   LDLCALC 98 03/31/2014   LDLCALC 122* 05/12/2013   Lab Results  Component Value Date   TRIG 111.0 04/06/2015   TRIG 105.0 03/31/2014   TRIG 103.0 05/12/2013   Lab Results  Component Value Date   CHOLHDL 3 04/06/2015   CHOLHDL 4 03/31/2014   CHOLHDL 4 05/12/2013   Lab Results  Component Value Date   LDLDIRECT 130.9 10/25/2008   LDLDIRECT 141.4 03/16/2007   LDLDIRECT 139.6 09/18/2006   a little  improved Eating more fish  Walking more  HDL is up a little and LDL is down slightly  On crestor and diet   Results for orders placed or performed in visit on 04/06/15  CBC with Differential/Platelet  Result Value Ref Range   WBC 4.4 4.0 - 10.5 K/uL   RBC 4.83 4.22 - 5.81 Mil/uL   Hemoglobin 14.7 13.0 - 17.0 g/dL   HCT 43.4 39.0 - 52.0 %   MCV 89.9 78.0 - 100.0 fl   MCHC 33.8 30.0 - 36.0 g/dL   RDW 14.2 11.5 - 15.5 %   Platelets 191.0 150.0 - 400.0 K/uL   Neutrophils Relative % 60.3 43.0 - 77.0 %   Lymphocytes Relative 28.8 12.0 - 46.0 %   Monocytes Relative 9.1 3.0 - 12.0 %   Eosinophils Relative 0.9 0.0 - 5.0 %   Basophils Relative 0.9 0.0 - 3.0 %   Neutro Abs 2.7 1.4 - 7.7 K/uL   Lymphs Abs 1.3 0.7 - 4.0 K/uL   Monocytes Absolute 0.4 0.1 - 1.0 K/uL   Eosinophils Absolute 0.0 0.0 - 0.7 K/uL   Basophils Absolute 0.0 0.0 - 0.1 K/uL  Comprehensive metabolic panel  Result  Value Ref Range   Sodium 139 135 - 145 mEq/L   Potassium 4.4 3.5 - 5.1 mEq/L   Chloride 104 96 - 112 mEq/L   CO2 28 19 - 32 mEq/L   Glucose, Bld 102 (H) 70 - 99 mg/dL   BUN 13 6 - 23 mg/dL   Creatinine, Ser 0.85 0.40 - 1.50 mg/dL   Total Bilirubin 0.8 0.2 - 1.2 mg/dL   Alkaline Phosphatase 52 39 - 117 U/L   AST 23 0 - 37 U/L   ALT 30 0 - 53 U/L   Total Protein 6.3 6.0 - 8.3 g/dL   Albumin 4.1 3.5 - 5.2 g/dL   Calcium 9.0 8.4 - 10.5 mg/dL   GFR 95.66 >60.00 mL/min  Lipid panel  Result Value Ref Range   Cholesterol 163 0 - 200 mg/dL   Triglycerides 111.0 0.0 - 149.0 mg/dL   HDL 48.20 >39.00 mg/dL   VLDL 22.2 0.0 - 40.0 mg/dL   LDL Cholesterol 93 0 - 99 mg/dL   Total CHOL/HDL Ratio 3    NonHDL 114.85   TSH  Result Value Ref Range   TSH 3.45 0.35 - 4.50 uIU/mL  PSA  Result Value Ref Range   PSA 2.39 0.10 - 4.00 ng/mL     Patient Active Problem List   Diagnosis Date Noted  . Fatigue 11/06/2014  . Laceration of hand, left 05/23/2014  . Anxiety 08/20/2013  . Colon cancer screening 03/31/2012    . Allergic drug reaction 01/29/2012  . Back pain 02/05/2011  . Routine general medical examination at a health care facility 11/08/2010  . Prostate cancer screening 11/08/2010  . GERD 09/20/2009  . ANXIETY STATE NOS 01/08/2007  . Attention deficit disorder 01/08/2007  . Hyperlipidemia 01/07/2007  . ERECTILE DYSFUNCTION 01/07/2007  . DEPRESSION 01/07/2007  . Allergic rhinitis 01/07/2007   Past Medical History  Diagnosis Date  . Allergy     allergic rhinitis  . Anxiety   . Depression   . ADD (attention deficit disorder)   . Hyperlipidemia   . Bursitis of hip, right   . History of meniscal tear     left knee meniscal tear   No past surgical history on file. Social History  Substance Use Topics  . Smoking status: Former Research scientist (life sciences)  . Smokeless tobacco: None     Comment: Smoked for about one year 30 years ago  . Alcohol Use: 0.0 oz/week    0 Standard drinks or equivalent per week     Comment: occ   Family History  Problem Relation Age of Onset  . Hypertension Mother   . Depression Mother   . Heart disease Father     CAD  . Heart disease Paternal Uncle     CAD  . COPD Maternal Grandfather    Allergies  Allergen Reactions  . Aleve [Naproxen Sodium] Swelling    Face and lips swollen and hives  . Omeprazole     REACTION: not effective   Current Outpatient Prescriptions on File Prior to Visit  Medication Sig Dispense Refill  . busPIRone (BUSPAR) 15 MG tablet Take 0.5 tablets (7.5 mg total) by mouth 2 (two) times daily. 30 tablet 5  . CRESTOR 40 MG tablet TAKE 1 TABLET BY MOUTH EVERY DAY 30 tablet 5  . pantoprazole (PROTONIX) 40 MG tablet TAKE 1 TABLET BY MOUTH ONCE DAILY 30 tablet 11  . sildenafil (REVATIO) 20 MG tablet Take 3 pills by mouth as needed 30 minutes before sexual activity 30  tablet 1  . traMADol (ULTRAM) 50 MG tablet Take 1 tablet by mouth as needed.     No current facility-administered medications on file prior to visit.    Review of Systems Review of  Systems  Constitutional: Negative for fever, appetite change, fatigue and unexpected weight change.  Eyes: Negative for pain and visual disturbance.  Respiratory: Negative for cough and shortness of breath.   Cardiovascular: Negative for cp or palpitations    Gastrointestinal: Negative for nausea, diarrhea and constipation.  Genitourinary: Negative for urgency and frequency.  Skin: Negative for pallor or rash   Neurological: Negative for weakness, light-headedness, numbness and headaches.  Hematological: Negative for adenopathy. Does not bruise/bleed easily.  Psychiatric/Behavioral: Negative for dysphoric mood. Anxiety and attention span have improved .         Objective:   Physical Exam  Constitutional: He appears well-developed and well-nourished. No distress.  Well appearing   HENT:  Head: Normocephalic and atraumatic.  Right Ear: External ear normal.  Left Ear: External ear normal.  Nose: Nose normal.  Mouth/Throat: Oropharynx is clear and moist.  Eyes: Conjunctivae and EOM are normal. Pupils are equal, round, and reactive to light. Right eye exhibits no discharge. Left eye exhibits no discharge. No scleral icterus.  Neck: Normal range of motion. Neck supple. No JVD present. Carotid bruit is not present. No thyromegaly present.  Cardiovascular: Normal rate, regular rhythm, normal heart sounds and intact distal pulses.  Exam reveals no gallop.   Pulmonary/Chest: Effort normal and breath sounds normal. No respiratory distress. He has no wheezes. He exhibits no tenderness.  Abdominal: Soft. Bowel sounds are normal. He exhibits no distension, no abdominal bruit and no mass. There is no tenderness.  Musculoskeletal: He exhibits no edema or tenderness.  Lymphadenopathy:    He has no cervical adenopathy.  Neurological: He is alert. He has normal reflexes. No cranial nerve deficit. He exhibits normal muscle tone. Coordination normal.  Skin: Skin is warm and dry. No rash noted. No  erythema. No pallor.  Psychiatric: He has a normal mood and affect.          Assessment & Plan:   Problem List Items Addressed This Visit      Other   Colon cancer screening    D/w patient OV:[id number] for colon cancer screening, including IFOB vs. colonoscopy.  Risks and benefits of both were discussed and patient voiced understanding.  Pt elects for: IFOB kit  He will call back to schedule colonoscopy in the future when he is ready      Relevant Orders   Fecal occult blood, imunochemical   Hyperlipidemia    Disc goals for lipids and reasons to control them Rev labs with pt Rev low sat fat diet in detail Well controlled with crestor       Prostate cancer screening    Lab Results  Component Value Date   PSA 2.39 04/06/2015   PSA 1.54 03/31/2014   PSA 1.62 05/12/2013    No symptoms or change in urinary habits  No family hx  Will continue to follow       Routine general medical examination at a health care facility - Primary    Reviewed health habits including diet and exercise and skin cancer prevention Reviewed appropriate screening tests for age  Also reviewed health mt list, fam hx and immunization status , as well as social and family history   See HPI Labs reviewed  prevnar vaccine today  Flu shot today  Please do stool kit for screening  Call us when you are ready for a colonoscopy referral  Take care of yourself  Keep exercising        Other Visit Diagnoses    Need for influenza vaccination        Need for vaccination with 13-polyvalent pneumococcal conjugate vaccine        Relevant Orders    Pneumococcal conjugate vaccine 13-valent (Completed)

## 2015-04-10 NOTE — Assessment & Plan Note (Signed)
D/w patient Benjamin Bullock for colon cancer screening, including IFOB vs. colonoscopy.  Risks and benefits of both were discussed and patient voiced understanding.  Pt elects for: IFOB kit  He will call back to schedule colonoscopy in the future when he is ready

## 2015-04-10 NOTE — Assessment & Plan Note (Signed)
Lab Results  Component Value Date   PSA 2.39 04/06/2015   PSA 1.54 03/31/2014   PSA 1.62 05/12/2013    No symptoms or change in urinary habits  No family hx  Will continue to follow

## 2015-04-19 ENCOUNTER — Ambulatory Visit: Payer: BLUE CROSS/BLUE SHIELD | Admitting: Family Medicine

## 2015-04-26 ENCOUNTER — Ambulatory Visit: Payer: BLUE CROSS/BLUE SHIELD | Admitting: Family Medicine

## 2015-06-23 ENCOUNTER — Other Ambulatory Visit: Payer: Self-pay

## 2015-06-23 MED ORDER — METHYLPHENIDATE HCL ER 20 MG PO TBCR: 20.0000 mg | EXTENDED_RELEASE_TABLET | Freq: Every day | ORAL | Status: DC

## 2015-06-23 NOTE — Telephone Encounter (Signed)
Pt left /vm requesting rx methylphenidate. Call when ready for pick up. Pt said would pick up next week. Pt last seen and rx printed # 30 on 04/10/15.

## 2015-06-23 NOTE — Telephone Encounter (Signed)
Pt notified Rx ready for pickup 

## 2015-06-23 NOTE — Telephone Encounter (Signed)
Px printed for pick up in IN box  

## 2015-06-29 ENCOUNTER — Encounter: Payer: Self-pay | Admitting: Family Medicine

## 2015-07-17 ENCOUNTER — Other Ambulatory Visit: Payer: Self-pay | Admitting: Family Medicine

## 2015-07-25 ENCOUNTER — Encounter: Payer: Self-pay | Admitting: Family Medicine

## 2015-09-12 ENCOUNTER — Other Ambulatory Visit: Payer: Self-pay | Admitting: *Deleted

## 2015-09-12 MED ORDER — ROSUVASTATIN CALCIUM 40 MG PO TABS: 40.0000 mg | ORAL_TABLET | Freq: Every day | ORAL | Status: DC

## 2015-09-12 NOTE — Telephone Encounter (Signed)
Received fax saying pt needs 90 day supply instead of 30, done

## 2015-09-28 ENCOUNTER — Other Ambulatory Visit: Payer: Self-pay

## 2015-09-28 NOTE — Telephone Encounter (Signed)
Pt left v/m requesting rx methyphenidate. Call when ready for pick up. Last printed # 30 on 06/23/15. Last annual 04/10/15.

## 2015-09-29 MED ORDER — METHYLPHENIDATE HCL ER 20 MG PO TBCR: 20.0000 mg | EXTENDED_RELEASE_TABLET | Freq: Every day | ORAL | Status: DC

## 2015-09-29 NOTE — Telephone Encounter (Signed)
Px printed for pick up in IN box  

## 2015-09-29 NOTE — Telephone Encounter (Signed)
Pt notified Rx ready for pickup 

## 2015-10-15 ENCOUNTER — Other Ambulatory Visit: Payer: Self-pay | Admitting: Family Medicine

## 2015-10-20 ENCOUNTER — Other Ambulatory Visit: Payer: Self-pay | Admitting: *Deleted

## 2015-10-20 MED ORDER — PANTOPRAZOLE SODIUM 40 MG PO TBEC: 40.0000 mg | DELAYED_RELEASE_TABLET | Freq: Every day | ORAL | Status: DC

## 2015-12-29 ENCOUNTER — Other Ambulatory Visit: Payer: Self-pay | Admitting: Family Medicine

## 2016-01-03 ENCOUNTER — Ambulatory Visit (INDEPENDENT_AMBULATORY_CARE_PROVIDER_SITE_OTHER): Payer: BLUE CROSS/BLUE SHIELD | Admitting: Family Medicine

## 2016-01-03 ENCOUNTER — Encounter: Payer: Self-pay | Admitting: Family Medicine

## 2016-01-03 VITALS — BP 116/74 | HR 82 | Temp 98.6°F | Ht 67.0 in | Wt 189.0 lb

## 2016-01-03 DIAGNOSIS — F909 Attention-deficit hyperactivity disorder, unspecified type: Secondary | ICD-10-CM

## 2016-01-03 DIAGNOSIS — F419 Anxiety disorder, unspecified: Secondary | ICD-10-CM | POA: Diagnosis not present

## 2016-01-03 DIAGNOSIS — F988 Other specified behavioral and emotional disorders with onset usually occurring in childhood and adolescence: Secondary | ICD-10-CM

## 2016-01-03 MED ORDER — TRAMADOL HCL 50 MG PO TABS: 50.0000 mg | ORAL_TABLET | Freq: Two times a day (BID) | ORAL | Status: DC | PRN

## 2016-01-03 MED ORDER — BUSPIRONE HCL 15 MG PO TABS: 15.0000 mg | ORAL_TABLET | Freq: Two times a day (BID) | ORAL | Status: DC

## 2016-01-03 NOTE — Progress Notes (Signed)
Subjective:    Patient ID: Benjamin Bullock, male    DOB: [birth year], 65 y.o.   MRN: [id number]  HPI Here for f/u of chronic health problems   Wt is up 3 lb with bmi of 29  Is doing well overall  Is walking more and more - 5 miles  Feels good doing that  Still bothers his hip  Takes tramadol for pain prn     Mood Anxiety - wife thinks he is more anxious and needs higher dose of medicine  Symptom is "overall worry" More stressors - more demands at work (regulatory changes are going to affected)== always has financial stresses  Sleeping is a problem - wakes up in the middle of the night -takes a while to get back to sleep   Job- feels helpless and trapped at times (even though he likes it)  ADD= is worsened by anxious  Is not taking his metadate as well   Cholesterol Lab Results  Component Value Date   CHOL 163 04/06/2015   HDL 48.20 04/06/2015   LDLCALC 93 04/06/2015   LDLDIRECT 130.9 10/25/2008   TRIG 111.0 04/06/2015   CHOLHDL 3 04/06/2015     Eats a very healthy diet  And tries to take care of himself  Patient Active Problem List   Diagnosis Date Noted  . Fatigue 11/06/2014  . Laceration of hand, left 05/23/2014  . Anxiety 08/20/2013  . Colon cancer screening 03/31/2012  . Allergic drug reaction 01/29/2012  . Back pain 02/05/2011  . Routine general medical examination at a health care facility 11/08/2010  . Prostate cancer screening 11/08/2010  . GERD 09/20/2009  . ANXIETY STATE NOS 01/08/2007  . Attention deficit disorder 01/08/2007  . Hyperlipidemia 01/07/2007  . ERECTILE DYSFUNCTION 01/07/2007  . DEPRESSION 01/07/2007  . Allergic rhinitis 01/07/2007   Past Medical History  Diagnosis Date  . Allergy     allergic rhinitis  . Anxiety   . Depression   . ADD (attention deficit disorder)   . Hyperlipidemia   . Bursitis of hip, right   . History of meniscal tear     left knee meniscal tear   No past surgical history on file. Social History    Substance Use Topics  . Smoking status: Former Research scientist (life sciences)  . Smokeless tobacco: None     Comment: Smoked for about one year 30 years ago  . Alcohol Use: 0.0 oz/week    0 Standard drinks or equivalent per week     Comment: occ   Family History  Problem Relation Age of Onset  . Hypertension Mother   . Depression Mother   . Heart disease Father     CAD  . Heart disease Paternal Uncle     CAD  . COPD Maternal Grandfather    Allergies  Allergen Reactions  . Aleve [Naproxen Sodium] Swelling    Face and lips swollen and hives  . Omeprazole     REACTION: not effective   Current Outpatient Prescriptions on File Prior to Visit  Medication Sig Dispense Refill  . methylphenidate (METADATE ER) 20 MG ER tablet Take 1 tablet (20 mg total) by mouth daily. 30 tablet 0  . pantoprazole (PROTONIX) 40 MG tablet Take 1 tablet (40 mg total) by mouth daily. 90 tablet 1  . rosuvastatin (CRESTOR) 40 MG tablet Take 1 tablet (40 mg total) by mouth daily. 90 tablet 1  . sildenafil (REVATIO) 20 MG tablet Take 3 pills by mouth as needed 30 minutes  before sexual activity 30 tablet 1   No current facility-administered medications on file prior to visit.     Review of Systems Review of Systems  Constitutional: Negative for fever, appetite change, fatigue and unexpected weight change.  Eyes: Negative for pain and visual disturbance.  Respiratory: Negative for cough and shortness of breath.   Cardiovascular: Negative for cp or palpitations    Gastrointestinal: Negative for nausea, diarrhea and constipation.  Genitourinary: Negative for urgency and frequency.  Skin: Negative for pallor or rash   Neurological: Negative for weakness, light-headedness, numbness and headaches.  Hematological: Negative for adenopathy. Does not bruise/bleed easily.  Psychiatric/Behavioral: Negative for dysphoric mood. Pos for worry and anxiety and difficulty concentrating        Objective:   Physical Exam  Constitutional: He  appears well-developed and well-nourished. No distress.  overwt and well appearing  HENT:  Head: Normocephalic and atraumatic.  Mouth/Throat: Oropharynx is clear and moist.  Eyes: Conjunctivae and EOM are normal. Pupils are equal, round, and reactive to light.  Neck: Normal range of motion. Neck supple. No JVD present. Carotid bruit is not present. No thyromegaly present.  Cardiovascular: Normal rate, regular rhythm, normal heart sounds and intact distal pulses.  Exam reveals no gallop.   Pulmonary/Chest: Effort normal and breath sounds normal. No respiratory distress. He has no wheezes. He has no rales.  No crackles  Abdominal: He exhibits no abdominal bruit.  Lymphadenopathy:    He has no cervical adenopathy.  Neurological: He is alert. He has normal reflexes.  Skin: Skin is warm and dry. No rash noted.  Psychiatric: His mood appears anxious. His affect is not blunt, not labile and not inappropriate. His speech is tangential. Thought content is not paranoid. Cognition and memory are normal. He does not exhibit a depressed mood. He expresses no homicidal and no suicidal ideation.  Pt tends to stop mid sentence and change the subject Having difficulty paying attention He is inattentive.          Assessment & Plan:   Problem List Items Addressed This Visit      Other   Hyperlipidemia - Primary   Attention deficit disorder    Pt never picked up last refill- is having a hard time with attentiveness (also has anxiety) Urged him to pick up and start back on his metadate and he agreed       Anxiety    Long disc re: anxiety as it relates to lifestyle and quality of life Reviewed stressors/ coping techniques/symptoms/ support sources/ tx options and side effects in detail today Made suggestions -regarding exercise/self care/ reducing obligations and stress in his life/writing in a journal (he declines counseling and has done it before)  Pt is quite anxious and inattentive today -  difficult to interview him  Will inc his buspar to 1 mg bid Also enc him to get back on his ADD medication  >25 minutes spent in face to face time with patient, >50% spent in counselling or coordination of care F/u planned       Relevant Medications   busPIRone (BUSPAR) 15 MG tablet

## 2016-01-03 NOTE — Patient Instructions (Addendum)
Start making more concrete plans regarding career slow down and retirement  Re evaluate your priorities - monetary and other  What things can you do to reduce your stress level (work and other)  What can your wife and family do to help? With the goal of better self care  If you do not want to do counseling- writing in a journal helps  Also exercise Start back on your ADD medicine  Let's go up on buspar to 1 whole pill twice daily

## 2016-01-03 NOTE — Progress Notes (Signed)
Pre visit review using our clinic review tool, if applicable. No additional management support is needed unless otherwise documented below in the visit note. 

## 2016-01-06 NOTE — Assessment & Plan Note (Signed)
Long disc re: anxiety as it relates to lifestyle and quality of life Reviewed stressors/ coping techniques/symptoms/ support sources/ tx options and side effects in detail today Made suggestions -regarding exercise/self care/ reducing obligations and stress in his life/writing in a journal (he declines counseling and has done it before)  Pt is quite anxious and inattentive today - difficult to interview him  Will inc his buspar to 1 mg bid Also enc him to get back on his ADD medication  >25 minutes spent in face to face time with patient, >50% spent in counselling or coordination of care F/u planned

## 2016-01-06 NOTE — Assessment & Plan Note (Signed)
Pt never picked up last refill- is having a hard time with attentiveness (also has anxiety) Urged him to pick up and start back on his metadate and he agreed

## 2016-02-26 ENCOUNTER — Ambulatory Visit: Payer: BLUE CROSS/BLUE SHIELD | Admitting: Family Medicine

## 2016-02-29 ENCOUNTER — Other Ambulatory Visit: Payer: Self-pay

## 2016-02-29 NOTE — Telephone Encounter (Signed)
Pt left v/m requesting rx for ritalin; call when ready for pick up. Last seen 01/03/16 and rx last printed # 30 on 09/29/15.

## 2016-03-01 MED ORDER — METHYLPHENIDATE HCL ER 20 MG PO TBCR: 20.0000 mg | EXTENDED_RELEASE_TABLET | Freq: Every day | ORAL | 0 refills | Status: DC

## 2016-03-01 NOTE — Telephone Encounter (Signed)
Px printed for pick up in IN box  

## 2016-03-01 NOTE — Telephone Encounter (Signed)
Left voicemail letting pt know Rx ready for pick up 

## 2016-03-17 ENCOUNTER — Other Ambulatory Visit: Payer: Self-pay | Admitting: Family Medicine

## 2016-03-18 NOTE — Telephone Encounter (Signed)
CPE scheduled 04/15/16, last filled on 01/03/16 #30 with 0 refills, please advise

## 2016-03-18 NOTE — Telephone Encounter (Signed)
Rx called in as prescribed 

## 2016-03-18 NOTE — Telephone Encounter (Signed)
Px written for call in   

## 2016-03-29 ENCOUNTER — Other Ambulatory Visit: Payer: Self-pay | Admitting: Family Medicine

## 2016-04-07 ENCOUNTER — Telehealth: Payer: Self-pay | Admitting: Family Medicine

## 2016-04-07 DIAGNOSIS — Z125 Encounter for screening for malignant neoplasm of prostate: Secondary | ICD-10-CM

## 2016-04-07 DIAGNOSIS — Z0000 Encounter for general adult medical examination without abnormal findings: Secondary | ICD-10-CM

## 2016-04-07 NOTE — Telephone Encounter (Signed)
-----   Message from Marchia Bond sent at 04/05/2016  4:56 PM EDT ----- Regarding: Cpx labs Thurs 9/21, need orders. Thanks:-) Please order  future cpx labs for pt's upcoming lab appt. Thanks Aniceto Boss

## 2016-04-11 ENCOUNTER — Other Ambulatory Visit (INDEPENDENT_AMBULATORY_CARE_PROVIDER_SITE_OTHER): Payer: BLUE CROSS/BLUE SHIELD

## 2016-04-11 DIAGNOSIS — Z0000 Encounter for general adult medical examination without abnormal findings: Secondary | ICD-10-CM | POA: Diagnosis not present

## 2016-04-11 DIAGNOSIS — Z125 Encounter for screening for malignant neoplasm of prostate: Secondary | ICD-10-CM

## 2016-04-11 LAB — COMPREHENSIVE METABOLIC PANEL
ALT: 26 U/L (ref 0–53)
AST: 20 U/L (ref 0–37)
Albumin: 4.1 g/dL (ref 3.5–5.2)
Alkaline Phosphatase: 48 U/L (ref 39–117)
BUN: 13 mg/dL (ref 6–23)
CO2: 31 mEq/L (ref 19–32)
Calcium: 8.7 mg/dL (ref 8.4–10.5)
Chloride: 102 mEq/L (ref 96–112)
Creatinine, Ser: 0.96 mg/dL (ref 0.40–1.50)
GFR: 82.87 mL/min (ref >60.00)
Glucose, Bld: 101 mg/dL — ABNORMAL HIGH (ref 70–99)
Potassium: 4.3 mEq/L (ref 3.5–5.1)
Sodium: 139 mEq/L (ref 135–145)
Total Bilirubin: 0.7 mg/dL (ref 0.2–1.2)
Total Protein: 6.4 g/dL (ref 6.0–8.3)

## 2016-04-11 LAB — CBC WITH DIFFERENTIAL/PLATELET
Basophils Absolute: 0 10*3/uL (ref 0.0–0.1)
Basophils Relative: 0.6 % (ref 0.0–3.0)
Eosinophils Absolute: 0.1 10*3/uL (ref 0.0–0.7)
Eosinophils Relative: 1.3 % (ref 0.0–5.0)
HCT: 43.1 % (ref 39.0–52.0)
Hemoglobin: 15 g/dL (ref 13.0–17.0)
Lymphocytes Relative: 39.6 % (ref 12.0–46.0)
Lymphs Abs: 1.6 10*3/uL (ref 0.7–4.0)
MCHC: 34.8 g/dL (ref 30.0–36.0)
MCV: 89.2 fl (ref 78.0–100.0)
Monocytes Absolute: 0.4 10*3/uL (ref 0.1–1.0)
Monocytes Relative: 9 % (ref 3.0–12.0)
Neutro Abs: 2 10*3/uL (ref 1.4–7.7)
Neutrophils Relative %: 49.5 % (ref 43.0–77.0)
Platelets: 189 10*3/uL (ref 150.0–400.0)
RBC: 4.83 Mil/uL (ref 4.22–5.81)
RDW: 13.6 % (ref 11.5–15.5)
WBC: 4.1 10*3/uL (ref 4.0–10.5)

## 2016-04-11 LAB — PSA: PSA: 1.53 ng/mL (ref 0.10–4.00)

## 2016-04-11 LAB — LIPID PANEL
Cholesterol: 175 mg/dL (ref 0–200)
HDL: 48.2 mg/dL (ref >39.00)
LDL Cholesterol: 102 mg/dL — ABNORMAL HIGH (ref 0–99)
NonHDL: 126.74
Total CHOL/HDL Ratio: 4
Triglycerides: 126 mg/dL (ref 0.0–149.0)
VLDL: 25.2 mg/dL (ref 0.0–40.0)

## 2016-04-11 LAB — TSH: TSH: 3.4 u[IU]/mL (ref 0.35–4.50)

## 2016-04-15 ENCOUNTER — Ambulatory Visit (INDEPENDENT_AMBULATORY_CARE_PROVIDER_SITE_OTHER): Payer: BLUE CROSS/BLUE SHIELD | Admitting: Family Medicine

## 2016-04-15 ENCOUNTER — Encounter: Payer: Self-pay | Admitting: Family Medicine

## 2016-04-15 VITALS — BP 128/68 | HR 68 | Temp 98.0°F | Ht 67.0 in | Wt 184.8 lb

## 2016-04-15 DIAGNOSIS — E785 Hyperlipidemia, unspecified: Secondary | ICD-10-CM

## 2016-04-15 DIAGNOSIS — Z889 Allergy status to unspecified drugs, medicaments and biological substances status: Secondary | ICD-10-CM

## 2016-04-15 DIAGNOSIS — Z0000 Encounter for general adult medical examination without abnormal findings: Secondary | ICD-10-CM

## 2016-04-15 DIAGNOSIS — Z23 Encounter for immunization: Secondary | ICD-10-CM | POA: Diagnosis not present

## 2016-04-15 DIAGNOSIS — Z125 Encounter for screening for malignant neoplasm of prostate: Secondary | ICD-10-CM

## 2016-04-15 DIAGNOSIS — Z79899 Other long term (current) drug therapy: Secondary | ICD-10-CM

## 2016-04-15 DIAGNOSIS — T7840XA Allergy, unspecified, initial encounter: Secondary | ICD-10-CM

## 2016-04-15 DIAGNOSIS — F909 Attention-deficit hyperactivity disorder, unspecified type: Secondary | ICD-10-CM

## 2016-04-15 DIAGNOSIS — F988 Other specified behavioral and emotional disorders with onset usually occurring in childhood and adolescence: Secondary | ICD-10-CM

## 2016-04-15 DIAGNOSIS — K219 Gastro-esophageal reflux disease without esophagitis: Secondary | ICD-10-CM

## 2016-04-15 DIAGNOSIS — Z1211 Encounter for screening for malignant neoplasm of colon: Secondary | ICD-10-CM

## 2016-04-15 MED ORDER — METHYLPHENIDATE HCL ER 20 MG PO TBCR: 20.0000 mg | EXTENDED_RELEASE_TABLET | Freq: Every day | ORAL | 0 refills | Status: DC

## 2016-04-15 MED ORDER — ROSUVASTATIN CALCIUM 40 MG PO TABS: 40.0000 mg | ORAL_TABLET | Freq: Every day | ORAL | 3 refills | Status: DC

## 2016-04-15 MED ORDER — PANTOPRAZOLE SODIUM 40 MG PO TBEC: 40.0000 mg | DELAYED_RELEASE_TABLET | Freq: Every day | ORAL | 3 refills | Status: DC

## 2016-04-15 NOTE — Patient Instructions (Addendum)
F/u shot today  Take care of yourself  Stay active    If you want to come back for a B12 check in the future / we can do that - just call to schedule a lab appt when ready

## 2016-04-15 NOTE — Progress Notes (Signed)
Subjective:    Patient ID: Benjamin Bullock, male    DOB: Mar 10, 1951, 65 y.o.   MRN: [id number]  HPI Here for health maintenance exam and to review chronic medical problems    Still working/ not on medicare    Wt Readings from Last 3 Encounters:  04/15/16 184 lb 12 oz (83.8 kg)  01/03/16 189 lb (85.7 kg)  04/10/15 186 lb 8 oz (84.6 kg)  down 5lb -he is walking regularly -pushing grandson in stroller -loves it  bmi is 28.9  Flu shot-will get today   Colonoscopy- does not want one  Will do a IFOB kit   Hep C screen -low risk / declines   Tetanus shot 9/12  Zoster vac 11/14  PNA 13 9/16 PNA 23 9/15  Prostate cancer screen Lab Results  Component Value Date   PSA 1.53 04/11/2016   PSA 2.39 04/06/2015   PSA 1.54 03/31/2014   has nocturia if he drinks a lot of water- 1-2 times  Thinks he empties all the way  In general he drinks a lot of water   Hx of hyperlipidemia Lab Results  Component Value Date   CHOL 175 04/11/2016   CHOL 163 04/06/2015   CHOL 163 03/31/2014   Lab Results  Component Value Date   HDL 48.20 04/11/2016   HDL 48.20 04/06/2015   HDL 44.30 03/31/2014   Lab Results  Component Value Date   LDLCALC 102 (H) 04/11/2016   LDLCALC 93 04/06/2015   LDLCALC 98 03/31/2014   Lab Results  Component Value Date   TRIG 126.0 04/11/2016   TRIG 111.0 04/06/2015   TRIG 105.0 03/31/2014   Lab Results  Component Value Date   CHOLHDL 4 04/11/2016   CHOLHDL 3 04/06/2015   CHOLHDL 4 03/31/2014   Lab Results  Component Value Date   LDLDIRECT 130.9 10/25/2008   LDLDIRECT 141.4 03/16/2007   LDLDIRECT 139.6 09/18/2006  stable and well controlled  Eating more shellfish lately - LDL up a bit    Last visit was anxious and ADD was worse  Has counseling appt oct 3 Going up on buspar did help  occ misses 2nd dose - but usually good about it    Is back on ADD medicine -it does help  Work is still a tough environment   BP Readings from Last 3  Encounters:  04/15/16 128/68  01/03/16 116/74  04/10/15 122/66   had 3 cups coffee today- tries to drink decaf when able   Results for orders placed or performed in visit on 04/11/16  CBC with Differential/Platelet  Result Value Ref Range   WBC 4.1 4.0 - 10.5 K/uL   RBC 4.83 4.22 - 5.81 Mil/uL   Hemoglobin 15.0 13.0 - 17.0 g/dL   HCT 43.1 39.0 - 52.0 %   MCV 89.2 78.0 - 100.0 fl   MCHC 34.8 30.0 - 36.0 g/dL   RDW 13.6 11.5 - 15.5 %   Platelets 189.0 150.0 - 400.0 K/uL   Neutrophils Relative % 49.5 43.0 - 77.0 %   Lymphocytes Relative 39.6 12.0 - 46.0 %   Monocytes Relative 9.0 3.0 - 12.0 %   Eosinophils Relative 1.3 0.0 - 5.0 %   Basophils Relative 0.6 0.0 - 3.0 %   Neutro Abs 2.0 1.4 - 7.7 K/uL   Lymphs Abs 1.6 0.7 - 4.0 K/uL   Monocytes Absolute 0.4 0.1 - 1.0 K/uL   Eosinophils Absolute 0.1 0.0 - 0.7 K/uL   Basophils Absolute 0.0  0.0 - 0.1 K/uL  Comprehensive metabolic panel  Result Value Ref Range   Sodium 139 135 - 145 mEq/L   Potassium 4.3 3.5 - 5.1 mEq/L   Chloride 102 96 - 112 mEq/L   CO2 31 19 - 32 mEq/L   Glucose, Bld 101 (H) 70 - 99 mg/dL   BUN 13 6 - 23 mg/dL   Creatinine, Ser 0.96 0.40 - 1.50 mg/dL   Total Bilirubin 0.7 0.2 - 1.2 mg/dL   Alkaline Phosphatase 48 39 - 117 U/L   AST 20 0 - 37 U/L   ALT 26 0 - 53 U/L   Total Protein 6.4 6.0 - 8.3 g/dL   Albumin 4.1 3.5 - 5.2 g/dL   Calcium 8.7 8.4 - 10.5 mg/dL   GFR 82.87 >60.00 mL/min  Lipid panel  Result Value Ref Range   Cholesterol 175 0 - 200 mg/dL   Triglycerides 126.0 0.0 - 149.0 mg/dL   HDL 48.20 >39.00 mg/dL   VLDL 25.2 0.0 - 40.0 mg/dL   LDL Cholesterol 102 (H) 0 - 99 mg/dL   Total CHOL/HDL Ratio 4    NonHDL 126.74   PSA  Result Value Ref Range   PSA 1.53 0.10 - 4.00 ng/mL  TSH  Result Value Ref Range   TSH 3.40 0.35 - 4.50 uIU/mL     Patient Active Problem List   Diagnosis Date Noted  . Fatigue 11/06/2014  . Laceration of hand, left 05/23/2014  . Anxiety 08/20/2013  . Colon cancer  screening 03/31/2012  . Allergic drug reaction 01/29/2012  . Back pain 02/05/2011  . Routine general medical examination at a health care facility 11/08/2010  . Prostate cancer screening 11/08/2010  . GERD 09/20/2009  . ANXIETY STATE NOS 01/08/2007  . Attention deficit disorder 01/08/2007  . Hyperlipidemia 01/07/2007  . ERECTILE DYSFUNCTION 01/07/2007  . DEPRESSION 01/07/2007  . Allergic rhinitis 01/07/2007   Past Medical History:  Diagnosis Date  . ADD (attention deficit disorder)   . Allergy    allergic rhinitis  . Anxiety   . Bursitis of hip, right   . Depression   . History of meniscal tear    left knee meniscal tear  . Hyperlipidemia    No past surgical history on file. Social History  Substance Use Topics  . Smoking status: Former Research scientist (life sciences)  . Smokeless tobacco: Never Used     Comment: Smoked for about one year 30 years ago  . Alcohol use 0.0 oz/week     Comment: occ   Family History  Problem Relation Age of Onset  . Hypertension Mother   . Depression Mother   . Heart disease Father     CAD  . Heart disease Paternal Uncle     CAD  . COPD Maternal Grandfather    Allergies  Allergen Reactions  . Aleve [Naproxen Sodium] Swelling    Face and lips swollen and hives  . Omeprazole     REACTION: not effective   Current Outpatient Prescriptions on File Prior to Visit  Medication Sig Dispense Refill  . busPIRone (BUSPAR) 15 MG tablet Take 1 tablet (15 mg total) by mouth 2 (two) times daily. 60 tablet 11  . sildenafil (REVATIO) 20 MG tablet Take 3 pills by mouth as needed 30 minutes before sexual activity 30 tablet 1  . traMADol (ULTRAM) 50 MG tablet TAKE 1 TABLET BY MOUTH TWICE A DAY AS NEEDED 30 tablet 0   No current facility-administered medications on file prior to visit.  Review of Systems Review of Systems  Constitutional: Negative for fever, appetite change, fatigue and unexpected weight change.  Eyes: Negative for pain and visual disturbance.    Respiratory: Negative for cough and shortness of breath.   Cardiovascular: Negative for cp or palpitations    Gastrointestinal: Negative for nausea, diarrhea and constipation.  Genitourinary: Negative for urgency and frequency.  Skin: Negative for pallor or rash   MSK pos for hip bursitis  Neurological: Negative for weakness, light-headedness, numbness and headaches.  Hematological: Negative for adenopathy. Does not bruise/bleed easily.  Psychiatric/Behavioral: Negative for dysphoric mood. Pos for controlled anxiety and ADD         Objective:   Physical Exam  Constitutional: He appears well-developed and well-nourished. No distress.  overwt and well app  HENT:  Head: Normocephalic and atraumatic.  Right Ear: External ear normal.  Left Ear: External ear normal.  Nose: Nose normal.  Mouth/Throat: Oropharynx is clear and moist.  Eyes: Conjunctivae and EOM are normal. Pupils are equal, round, and reactive to light. Right eye exhibits no discharge. Left eye exhibits no discharge. No scleral icterus.  Neck: Normal range of motion. Neck supple. No JVD present. Carotid bruit is not present. No thyromegaly present.  Cardiovascular: Normal rate, regular rhythm, normal heart sounds and intact distal pulses.  Exam reveals no gallop.   Pulmonary/Chest: Effort normal and breath sounds normal. No respiratory distress. He has no wheezes. He exhibits no tenderness.  Abdominal: Soft. Bowel sounds are normal. He exhibits no distension, no abdominal bruit and no mass. There is no tenderness.  Musculoskeletal: He exhibits no edema or tenderness.  Lymphadenopathy:    He has no cervical adenopathy.  Neurological: He is alert. He has normal reflexes. No cranial nerve deficit. He exhibits normal muscle tone. Coordination normal.  Skin: Skin is warm and dry. No rash noted. No erythema. No pallor.  Solar lentigines on trunk   Psychiatric: He has a normal mood and affect.  Less anxious and more attentive  today  Is able to complete sentences and thoughts better than last visit Pleasant           Assessment & Plan:   Problem List Items Addressed This Visit      Digestive   GERD    Pt takes chronic ppi which works well  Has some fatigue and is curious regarding his B12 level Will set up another lab draw in the future for that       Relevant Medications   pantoprazole (PROTONIX) 40 MG tablet     Other   Allergic drug reaction   Attention deficit disorder    Back on generic ritalin-doing better Work place is still chaotic-emph imp of organization       Colon cancer screening    Declines colonoscopy  Ordered ifob for screening       Relevant Orders   Fecal occult blood, imunochemical   Hyperlipidemia    Disc goals for lipids and reasons to control them Rev labs with pt Rev low sat fat diet in detail Doing well with crestor and diet-good control       Relevant Medications   rosuvastatin (CRESTOR) 40 MG tablet   Prostate cancer screening    Lab Results  Component Value Date   PSA 1.53 04/11/2016   PSA 2.39 04/06/2015   PSA 1.54 03/31/2014   Overall no change in voiding habits No fam hx  Continue to follow       Routine general medical examination  at a health care facility - Primary    Reviewed health habits including diet and exercise and skin cancer prevention Reviewed appropriate screening tests for age  Also reviewed health mt list, fam hx and immunization status , as well as social and family history   See HPI Labs reviewed  F/u shot today  Please do the ifob stool kit for colon screening - let us know if you change your mind about a colonoscopy Take care of yourself  Stay active        Other Visit Diagnoses    Need for influenza vaccination       Relevant Orders   Flu Vaccine QUAD 36+ mos IM (Completed)

## 2016-04-15 NOTE — Assessment & Plan Note (Signed)
Disc goals for lipids and reasons to control them Rev labs with pt Rev low sat fat diet in detail Doing well with crestor and diet-good control

## 2016-04-15 NOTE — Progress Notes (Signed)
Pre visit review using our clinic review tool, if applicable. No additional management support is needed unless otherwise documented below in the visit note. 

## 2016-04-15 NOTE — Assessment & Plan Note (Signed)
Declines colonoscopy  Ordered ifob for screening

## 2016-04-15 NOTE — Assessment & Plan Note (Signed)
Pt takes chronic ppi which works well  Has some fatigue and is curious regarding his B12 level Will set up another lab draw in the future for that

## 2016-04-15 NOTE — Assessment & Plan Note (Signed)
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

## 2016-04-15 NOTE — Assessment & Plan Note (Signed)
Reviewed health habits including diet and exercise and skin cancer prevention Reviewed appropriate screening tests for age  Also reviewed health mt list, fam hx and immunization status , as well as social and family history   See HPI Labs reviewed  F/u shot today  Please do the ifob stool kit for colon screening - let us know if you change your mind about a colonoscopy Take care of yourself  Stay active

## 2016-04-15 NOTE — Assessment & Plan Note (Signed)
Lab Results  Component Value Date   PSA 1.53 04/11/2016   PSA 2.39 04/06/2015   PSA 1.54 03/31/2014   Overall no change in voiding habits No fam hx  Continue to follow

## 2016-04-17 ENCOUNTER — Other Ambulatory Visit: Payer: Self-pay | Admitting: Family Medicine

## 2016-04-18 NOTE — Telephone Encounter (Signed)
Pt had CPE on 04/15/16, last filled on 03/18/16 #30 tabs with 0 refills, please advise

## 2016-04-18 NOTE — Telephone Encounter (Signed)
Rx called in as prescribed 

## 2016-04-18 NOTE — Telephone Encounter (Signed)
Px written for call in   

## 2016-05-04 ENCOUNTER — Other Ambulatory Visit: Payer: Self-pay | Admitting: Family Medicine

## 2016-05-24 ENCOUNTER — Other Ambulatory Visit: Payer: Self-pay | Admitting: Family Medicine

## 2016-05-24 NOTE — Telephone Encounter (Signed)
Px written for call in   

## 2016-05-24 NOTE — Telephone Encounter (Signed)
Pt had CPE on 04/15/16 and has his next years CPE scheduled too. Last filled on 04/18/16 #30 tabs with 0 refills, please advise

## 2016-05-24 NOTE — Telephone Encounter (Signed)
Rx called in as prescribed 

## 2016-06-11 ENCOUNTER — Other Ambulatory Visit: Payer: Self-pay | Admitting: Family Medicine

## 2016-08-08 ENCOUNTER — Other Ambulatory Visit: Payer: Self-pay | Admitting: Family Medicine

## 2016-08-09 NOTE — Telephone Encounter (Signed)
CPE was done on 04/15/16 and CPE for this year is scheduled too. Last filled on 05/24/16 #30 tabs with 0 refills, please advise

## 2016-08-09 NOTE — Telephone Encounter (Signed)
Rx called in as prescribed 

## 2016-08-09 NOTE — Telephone Encounter (Signed)
Px written for call in   

## 2016-08-12 ENCOUNTER — Other Ambulatory Visit: Payer: Self-pay

## 2016-08-12 NOTE — Telephone Encounter (Signed)
Pt left v/m requesting rx methylphenidate. Call when ready for pick up. Last printed # 30 on 04/15/16. Pt last annual exam on 04/15/16.

## 2016-08-13 MED ORDER — METHYLPHENIDATE HCL ER 20 MG PO TBCR: 20.0000 mg | EXTENDED_RELEASE_TABLET | Freq: Every day | ORAL | 0 refills | Status: DC

## 2016-08-13 NOTE — Telephone Encounter (Signed)
Px printed for pick up in IN box  

## 2016-08-13 NOTE — Telephone Encounter (Signed)
Left voicemail letting pt know Rx ready for pick up and he is due for his UDS

## 2016-08-19 ENCOUNTER — Encounter: Payer: Self-pay | Admitting: Family Medicine

## 2016-08-21 ENCOUNTER — Encounter: Payer: Self-pay | Admitting: Family Medicine

## 2016-10-25 ENCOUNTER — Other Ambulatory Visit: Payer: Self-pay

## 2016-10-25 NOTE — Telephone Encounter (Signed)
Pt left note requesting rx methylphenidate. Call when ready for pick up. Last seen 04/15/16 annual exam and last printed # 30 on 08/13/16.

## 2016-10-28 MED ORDER — METHYLPHENIDATE HCL ER 20 MG PO TBCR: 20.0000 mg | EXTENDED_RELEASE_TABLET | Freq: Every day | ORAL | 0 refills | Status: DC

## 2016-10-28 NOTE — Telephone Encounter (Signed)
Pt notified Rx ready for pickup 

## 2016-10-28 NOTE — Telephone Encounter (Signed)
Px printed for pick up in IN box  

## 2017-02-12 ENCOUNTER — Other Ambulatory Visit: Payer: Self-pay

## 2017-02-12 MED ORDER — METHYLPHENIDATE HCL ER 20 MG PO TBCR: 20.0000 mg | EXTENDED_RELEASE_TABLET | Freq: Every day | ORAL | 0 refills | Status: DC

## 2017-02-12 NOTE — Telephone Encounter (Signed)
Pt left v/m requesting rx methylphenidate. Call when ready for pick up. Last printed # 30 on 10/28/16 and last seen 04/15/16 for annual exam.

## 2017-02-12 NOTE — Telephone Encounter (Signed)
Px printed for pick up in IN box  

## 2017-02-12 NOTE — Telephone Encounter (Signed)
Pt notified Rx ready for pickup 

## 2017-04-07 ENCOUNTER — Telehealth: Payer: Self-pay | Admitting: Family Medicine

## 2017-04-07 DIAGNOSIS — Z125 Encounter for screening for malignant neoplasm of prostate: Secondary | ICD-10-CM

## 2017-04-07 DIAGNOSIS — Z0000 Encounter for general adult medical examination without abnormal findings: Secondary | ICD-10-CM

## 2017-04-07 DIAGNOSIS — E7800 Pure hypercholesterolemia, unspecified: Secondary | ICD-10-CM

## 2017-04-07 NOTE — Telephone Encounter (Signed)
-----   Message from Ellamae Sia sent at 04/07/2017 10:27 AM EDT ----- Regarding: Lab orders for Monday, 9.24.18 Patient is scheduled for CPX labs, please order future labs, Thanks , Karna Christmas

## 2017-04-10 ENCOUNTER — Other Ambulatory Visit: Payer: Self-pay | Admitting: Family Medicine

## 2017-04-14 ENCOUNTER — Other Ambulatory Visit (INDEPENDENT_AMBULATORY_CARE_PROVIDER_SITE_OTHER): Payer: BLUE CROSS/BLUE SHIELD

## 2017-04-14 DIAGNOSIS — Z125 Encounter for screening for malignant neoplasm of prostate: Secondary | ICD-10-CM | POA: Diagnosis not present

## 2017-04-14 DIAGNOSIS — E7800 Pure hypercholesterolemia, unspecified: Secondary | ICD-10-CM

## 2017-04-14 DIAGNOSIS — Z79899 Other long term (current) drug therapy: Secondary | ICD-10-CM | POA: Diagnosis not present

## 2017-04-14 DIAGNOSIS — Z0000 Encounter for general adult medical examination without abnormal findings: Secondary | ICD-10-CM

## 2017-04-14 LAB — COMPREHENSIVE METABOLIC PANEL
ALT: 21 U/L (ref 0–53)
AST: 21 U/L (ref 0–37)
Albumin: 4.2 g/dL (ref 3.5–5.2)
Alkaline Phosphatase: 50 U/L (ref 39–117)
BUN: 13 mg/dL (ref 6–23)
CO2: 27 mEq/L (ref 19–32)
Calcium: 8.8 mg/dL (ref 8.4–10.5)
Chloride: 105 mEq/L (ref 96–112)
Creatinine, Ser: 0.91 mg/dL (ref 0.40–1.50)
GFR: 87.88 mL/min (ref >60.00)
Glucose, Bld: 114 mg/dL — ABNORMAL HIGH (ref 70–99)
Potassium: 4.2 mEq/L (ref 3.5–5.1)
Sodium: 139 mEq/L (ref 135–145)
Total Bilirubin: 0.8 mg/dL (ref 0.2–1.2)
Total Protein: 6.4 g/dL (ref 6.0–8.3)

## 2017-04-14 LAB — LIPID PANEL
Cholesterol: 186 mg/dL (ref 0–200)
HDL: 52.2 mg/dL (ref >39.00)
LDL Cholesterol: 116 mg/dL — ABNORMAL HIGH (ref 0–99)
NonHDL: 133.98
Total CHOL/HDL Ratio: 4
Triglycerides: 89 mg/dL (ref 0.0–149.0)
VLDL: 17.8 mg/dL (ref 0.0–40.0)

## 2017-04-14 LAB — CBC WITH DIFFERENTIAL/PLATELET
Basophils Absolute: 0 10*3/uL (ref 0.0–0.1)
Basophils Relative: 0.9 % (ref 0.0–3.0)
Eosinophils Absolute: 0.1 10*3/uL (ref 0.0–0.7)
Eosinophils Relative: 1.2 % (ref 0.0–5.0)
HCT: 44.5 % (ref 39.0–52.0)
Hemoglobin: 14.9 g/dL (ref 13.0–17.0)
Lymphocytes Relative: 25.1 % (ref 12.0–46.0)
Lymphs Abs: 1.1 10*3/uL (ref 0.7–4.0)
MCHC: 33.6 g/dL (ref 30.0–36.0)
MCV: 91.4 fl (ref 78.0–100.0)
Monocytes Absolute: 0.4 10*3/uL (ref 0.1–1.0)
Monocytes Relative: 8.4 % (ref 3.0–12.0)
Neutro Abs: 2.9 10*3/uL (ref 1.4–7.7)
Neutrophils Relative %: 64.4 % (ref 43.0–77.0)
Platelets: 190 10*3/uL (ref 150.0–400.0)
RBC: 4.87 Mil/uL (ref 4.22–5.81)
RDW: 13.7 % (ref 11.5–15.5)
WBC: 4.5 10*3/uL (ref 4.0–10.5)

## 2017-04-14 LAB — TSH: TSH: 3.18 u[IU]/mL (ref 0.35–4.50)

## 2017-04-14 LAB — PSA: PSA: 1.77 ng/mL (ref 0.10–4.00)

## 2017-04-14 LAB — VITAMIN B12: Vitamin B-12: 129 pg/mL — ABNORMAL LOW (ref 211–911)

## 2017-04-19 ENCOUNTER — Other Ambulatory Visit: Payer: Self-pay | Admitting: Family Medicine

## 2017-04-22 ENCOUNTER — Ambulatory Visit (INDEPENDENT_AMBULATORY_CARE_PROVIDER_SITE_OTHER): Payer: BLUE CROSS/BLUE SHIELD | Admitting: Family Medicine

## 2017-04-22 ENCOUNTER — Encounter: Payer: Self-pay | Admitting: Family Medicine

## 2017-04-22 VITALS — BP 133/70 | HR 61 | Temp 98.5°F | Ht 66.75 in | Wt 188.5 lb

## 2017-04-22 DIAGNOSIS — F528 Other sexual dysfunction not due to a substance or known physiological condition: Secondary | ICD-10-CM

## 2017-04-22 DIAGNOSIS — R739 Hyperglycemia, unspecified: Secondary | ICD-10-CM

## 2017-04-22 DIAGNOSIS — Z23 Encounter for immunization: Secondary | ICD-10-CM

## 2017-04-22 DIAGNOSIS — E538 Deficiency of other specified B group vitamins: Secondary | ICD-10-CM | POA: Diagnosis not present

## 2017-04-22 DIAGNOSIS — E7800 Pure hypercholesterolemia, unspecified: Secondary | ICD-10-CM | POA: Diagnosis not present

## 2017-04-22 DIAGNOSIS — Z79899 Other long term (current) drug therapy: Secondary | ICD-10-CM

## 2017-04-22 DIAGNOSIS — Z125 Encounter for screening for malignant neoplasm of prostate: Secondary | ICD-10-CM

## 2017-04-22 DIAGNOSIS — Z0000 Encounter for general adult medical examination without abnormal findings: Secondary | ICD-10-CM | POA: Diagnosis not present

## 2017-04-22 DIAGNOSIS — F988 Other specified behavioral and emotional disorders with onset usually occurring in childhood and adolescence: Secondary | ICD-10-CM

## 2017-04-22 DIAGNOSIS — Z1211 Encounter for screening for malignant neoplasm of colon: Secondary | ICD-10-CM

## 2017-04-22 MED ORDER — PANTOPRAZOLE SODIUM 40 MG PO TBEC: 40.0000 mg | DELAYED_RELEASE_TABLET | Freq: Every day | ORAL | 3 refills | Status: DC

## 2017-04-22 MED ORDER — SILDENAFIL CITRATE 20 MG PO TABS: ORAL_TABLET | ORAL | 3 refills | Status: DC

## 2017-04-22 MED ORDER — CYANOCOBALAMIN 1000 MCG/ML IJ SOLN
1000.0000 ug | Freq: Once | INTRAMUSCULAR | Status: AC
  Administered 2017-04-22: 1000 ug via INTRAMUSCULAR

## 2017-04-22 MED ORDER — TRAMADOL HCL 50 MG PO TABS: 50.0000 mg | ORAL_TABLET | Freq: Two times a day (BID) | ORAL | 0 refills | Status: DC | PRN

## 2017-04-22 MED ORDER — ROSUVASTATIN CALCIUM 40 MG PO TABS: 40.0000 mg | ORAL_TABLET | Freq: Every day | ORAL | 3 refills | Status: DC

## 2017-04-22 NOTE — Assessment & Plan Note (Signed)
Lab Results  Component Value Date   VITAMINB12 129 (L) 04/14/2017   B12 shot today  Then take 1000 mcg otc daily  Re check level 1 mo  On ppi- may be reducing his abs of B12 Disc diet

## 2017-04-22 NOTE — Assessment & Plan Note (Signed)
Pt's ins will not pay for a screening colonoscopy  Will give ifob kit  Info on cologuard - consider later if ins covers it

## 2017-04-22 NOTE — Progress Notes (Signed)
Subjective:    Patient ID: Benjamin Bullock, male    DOB: 1951/06/25, 66 y.o.   MRN: [id number]  HPI  Here for health maintenance exam and to review chronic medical problems    Doing ok overall  Working a lot   He is not using medicare-continues to work   [employer] from Last 3 Encounters:  04/22/17 188 lb 8 oz (85.5 kg)  04/15/16 184 lb 12 oz (83.8 kg)  01/03/16 189 lb (85.7 kg)  exercise and diet are the same    (hip problem is a little improved with better shoes)  29.74 kg/m   Needs a tramadol refill for hip pain/ takes it infrequently   Flu shot-will get today   Colon cancer screening (his company will not pay for it) - he cannot get a colonoscopy  ifob 9/07   Completed PNA vaccines   zostavax 11/14  Tdap 9/12  Prostate health  Lab Results  Component Value Date   PSA 1.77 04/14/2017   PSA 1.53 04/11/2016   PSA 2.39 04/06/2015  no change in urination  Nocturia once per night  He drinks a lot of water as well  Stream /flow is slightly slower   Takes sildenafil for ED That works well    Blood pressure BP Readings from Last 3 Encounters:  04/22/17 (!) 146/82  04/15/16 128/68  01/03/16 116/74    Hyperlipidemia Lab Results  Component Value Date   CHOL 186 04/14/2017   CHOL 175 04/11/2016   CHOL 163 04/06/2015   Lab Results  Component Value Date   HDL 52.20 04/14/2017   HDL 48.20 04/11/2016   HDL 48.20 04/06/2015   Lab Results  Component Value Date   LDLCALC 116 (H) 04/14/2017   LDLCALC 102 (H) 04/11/2016   LDLCALC 93 04/06/2015   Lab Results  Component Value Date   TRIG 89.0 04/14/2017   TRIG 126.0 04/11/2016   TRIG 111.0 04/06/2015   Lab Results  Component Value Date   CHOLHDL 4 04/14/2017   CHOLHDL 4 04/11/2016   CHOLHDL 3 04/06/2015   Lab Results  Component Value Date   LDLDIRECT 130.9 10/25/2008   LDLDIRECT 141.4 03/16/2007   LDLDIRECT 139.6 09/18/2006   takes crestor and watches diet  HDL is up slt  LDL is up -occ  forgets to take his crestor  Diet-not as good/eats at his desk - occ fast food    GERD- uses ppi He still needs it daily to prevent symptoms  Coffee makes it worse  Vit B12 is low Lab Results  Component Value Date   VITAMINB12 129 (L) 04/14/2017    last glucose 114- fasting   Lab Results  Component Value Date   CREATININE 0.91 04/14/2017   BUN 13 04/14/2017   NA 139 04/14/2017   K 4.2 04/14/2017   CL 105 04/14/2017   CO2 27 04/14/2017   Lab Results  Component Value Date   ALT 21 04/14/2017   AST 21 04/14/2017   ALKPHOS 50 04/14/2017   BILITOT 0.8 04/14/2017   Lab Results  Component Value Date   TSH 3.18 04/14/2017    ADD-stable  Anxiety -stable   Patient Active Problem List   Diagnosis Date Noted  . B12 deficiency 04/22/2017  . Blood glucose elevated 04/22/2017  . Current use of proton pump inhibitor 04/15/2016  . Anxiety 08/20/2013  . Colon cancer screening 03/31/2012  . Allergic drug reaction 01/29/2012  . Back pain 02/05/2011  . Routine general medical  examination at a health care facility 11/08/2010  . Prostate cancer screening 11/08/2010  . GERD 09/20/2009  . ANXIETY STATE NOS 01/08/2007  . Attention deficit disorder 01/08/2007  . Hyperlipidemia 01/07/2007  . ERECTILE DYSFUNCTION 01/07/2007  . DEPRESSION 01/07/2007  . Allergic rhinitis 01/07/2007   Past Medical History:  Diagnosis Date  . ADD (attention deficit disorder)   . Allergy    allergic rhinitis  . Anxiety   . Bursitis of hip, right   . Depression   . History of meniscal tear    left knee meniscal tear  . Hyperlipidemia    No past surgical history on file. Social History  Substance Use Topics  . Smoking status: Former Research scientist (life sciences)  . Smokeless tobacco: Never Used     Comment: Smoked for about one year 30 years ago  . Alcohol use 0.0 oz/week     Comment: occ   Family History  Problem Relation Age of Onset  . Hypertension Mother   . Depression Mother   . Heart disease Father         CAD  . Heart disease Paternal Uncle        CAD  . COPD Maternal Grandfather    Allergies  Allergen Reactions  . Aleve [Naproxen Sodium] Swelling    Face and lips swollen and hives  . Omeprazole     REACTION: not effective   Current Outpatient Prescriptions on File Prior to Visit  Medication Sig Dispense Refill  . methylphenidate (METADATE ER) 20 MG ER tablet Take 1 tablet (20 mg total) by mouth daily. 30 tablet 0   No current facility-administered medications on file prior to visit.     Review of Systems  Constitutional: Positive for fatigue. Negative for activity change, appetite change, fever and unexpected weight change.  HENT: Negative for congestion, rhinorrhea, sore throat and trouble swallowing.   Eyes: Negative for pain, redness, itching and visual disturbance.  Respiratory: Negative for cough, chest tightness, shortness of breath and wheezing.   Cardiovascular: Negative for chest pain and palpitations.  Gastrointestinal: Negative for abdominal pain, blood in stool, constipation, diarrhea and nausea.  Endocrine: Negative for cold intolerance, heat intolerance, polydipsia and polyuria.  Genitourinary: Negative for difficulty urinating, dysuria, frequency and urgency.  Musculoskeletal: Negative for arthralgias, joint swelling and myalgias.       Pos for occ hip pain   Skin: Negative for pallor and rash.  Neurological: Negative for dizziness, tremors, weakness, numbness and headaches.  Hematological: Negative for adenopathy. Does not bruise/bleed easily.  Psychiatric/Behavioral: Negative for decreased concentration and dysphoric mood. The patient is not nervous/anxious.        Pos for stressors from work       Objective:   Physical Exam  Constitutional: He appears well-developed and well-nourished. No distress.  overwt and well app  HENT:  Head: Normocephalic and atraumatic.  Right Ear: External ear normal.  Left Ear: External ear normal.  Nose: Nose normal.    Mouth/Throat: Oropharynx is clear and moist.  Eyes: Pupils are equal, round, and reactive to light. Conjunctivae and EOM are normal. Right eye exhibits no discharge. Left eye exhibits no discharge. No scleral icterus.  Neck: Normal range of motion. Neck supple. No JVD present. Carotid bruit is not present. No thyromegaly present.  Cardiovascular: Normal rate, regular rhythm, normal heart sounds and intact distal pulses.  Exam reveals no gallop.   Pulmonary/Chest: Effort normal and breath sounds normal. No respiratory distress. He has no wheezes. He exhibits no tenderness.  Abdominal: Soft. Bowel sounds are normal. He exhibits no distension, no abdominal bruit and no mass. There is no tenderness.  Musculoskeletal: He exhibits no edema or tenderness.  Lymphadenopathy:    He has no cervical adenopathy.  Neurological: He is alert. He has normal reflexes. No cranial nerve deficit. He exhibits normal muscle tone. Coordination normal.  Skin: Skin is warm and dry. No rash noted. No erythema. No pallor.  Solar lentigines diffusely   Psychiatric: He has a normal mood and affect.  Less anxious than last visit Pleasant and talkative           Assessment & Plan:   Problem List Items Addressed This Visit      Other   Attention deficit disorder    Stable on methylphenidate      B12 deficiency    Lab Results  Component Value Date   VITAMINB12 129 (L) 04/14/2017   B12 shot today  Then take 1000 mcg otc daily  Re check level 1 mo  On ppi- may be reducing his abs of B12 Disc diet       Blood glucose elevated    114 fasting  disc imp of low glycemic diet and wt loss to prevent DM2  Will check A1C in a month  Handouts given       Colon cancer screening    Pt's ins will not pay for a screening colonoscopy  Will give ifob kit  Info on cologuard - consider later if ins covers it       Current use of proton pump inhibitor    B12 low- will treat this  Disc poss side eff  Enc diet  change so he may not need it in the future       ERECTILE DYSFUNCTION    Sildenafil is working well       Hyperlipidemia    Disc goals for lipids and reasons to control them Rev labs with pt Rev low sat fat diet in detail Labs rev  Needs to be more compliant with crestor in light of inc LDL        Relevant Medications   rosuvastatin (CRESTOR) 40 MG tablet   sildenafil (REVATIO) 20 MG tablet   Prostate cancer screening    Lab Results  Component Value Date   PSA 1.77 04/14/2017   PSA 1.53 04/11/2016   PSA 2.39 04/06/2015   No clinical changes No family hx       Routine general medical examination at a health care facility - Primary    Reviewed health habits including diet and exercise and skin cancer prevention Reviewed appropriate screening tests for age  Also reviewed health mt list, fam hx and immunization status , as well as social and family history   See HPI Flu shot today  Labs rev  B12 shot today -will begin treating that  Disc prev of DM  Disc better compliance with crestor  Stable prostate screen  ifob for colon screen (his ins will not pay for a colonoscopy)

## 2017-04-22 NOTE — Assessment & Plan Note (Signed)
Sildenafil is working well

## 2017-04-22 NOTE — Assessment & Plan Note (Signed)
Lab Results  Component Value Date   PSA 1.77 04/14/2017   PSA 1.53 04/11/2016   PSA 2.39 04/06/2015   No clinical changes No family hx

## 2017-04-22 NOTE — Addendum Note (Signed)
Addended by: Tammi Sou on: 04/22/2017 10:28 AM   Modules accepted: Orders

## 2017-04-22 NOTE — Assessment & Plan Note (Signed)
Reviewed health habits including diet and exercise and skin cancer prevention Reviewed appropriate screening tests for age  Also reviewed health mt list, fam hx and immunization status , as well as social and family history   See HPI Flu shot today  Labs rev  B12 shot today -will begin treating that  Disc prev of DM  Disc better compliance with crestor  Stable prostate screen  ifob for colon screen (his ins will not pay for a colonoscopy)

## 2017-04-22 NOTE — Patient Instructions (Addendum)
Flu shot today  Please do the stool kit for colon screening   Don't forget to take your cholesterol medicine  Set your cell phone with an alarm daily so you don't forget to take it   Your glucose is mildly high - we need to watch for diabetes  Try to get most of your carbohydrates from produce (with the exception of white potatoes)  Eat less bread/pasta/rice/snack foods/cereals/sweets and other items from the middle of the grocery store (processed carbs)  Weight loss will also help prevent diabetes See the handouts I gave you   B12 shot today  Buy vitamin B12 over the counter 1000 mcg and take one pill daily (the opposite time as your protonix)   We will check labs in 1 month for B12 (also do a diabetes test)     

## 2017-04-22 NOTE — Assessment & Plan Note (Signed)
Stable on methylphenidate

## 2017-04-22 NOTE — Assessment & Plan Note (Signed)
114 fasting  disc imp of low glycemic diet and wt loss to prevent DM2  Will check A1C in a month  Handouts given

## 2017-04-22 NOTE — Assessment & Plan Note (Signed)
B12 low- will treat this  Disc poss side eff  Enc diet change so he may not need it in the future

## 2017-04-22 NOTE — Assessment & Plan Note (Signed)
Disc goals for lipids and reasons to control them Rev labs with pt Rev low sat fat diet in detail Labs rev  Needs to be more compliant with crestor in light of inc LDL

## 2017-05-22 ENCOUNTER — Encounter: Payer: Self-pay | Admitting: Family Medicine

## 2017-05-22 ENCOUNTER — Other Ambulatory Visit (INDEPENDENT_AMBULATORY_CARE_PROVIDER_SITE_OTHER): Payer: BLUE CROSS/BLUE SHIELD

## 2017-05-22 ENCOUNTER — Telehealth: Payer: Self-pay | Admitting: Family Medicine

## 2017-05-22 DIAGNOSIS — R739 Hyperglycemia, unspecified: Secondary | ICD-10-CM

## 2017-05-22 DIAGNOSIS — R7303 Prediabetes: Secondary | ICD-10-CM | POA: Insufficient documentation

## 2017-05-22 DIAGNOSIS — E538 Deficiency of other specified B group vitamins: Secondary | ICD-10-CM | POA: Diagnosis not present

## 2017-05-22 LAB — HEMOGLOBIN A1C: Hgb A1c MFr Bld: 6.2 % (ref 4.6–6.5)

## 2017-05-22 LAB — VITAMIN B12: Vitamin B-12: 331 pg/mL (ref 211–911)

## 2017-05-22 NOTE — Telephone Encounter (Signed)
Pt had labs this morning and needed to get a refill on  methylphenida te er 20 mg tab  Please advise pt when ready for pick up

## 2017-05-22 NOTE — Telephone Encounter (Signed)
Requesting: Methylphenidate ER 20mg  Contract: Yes UDS: 1.29.2018 Low-risk next screen 1.29.2019 Last OV: 10.02.2018 Next OV: Not scheduled Last Refill: 2.25.2018   Please advise

## 2017-05-23 ENCOUNTER — Encounter: Payer: Self-pay | Admitting: *Deleted

## 2017-05-23 MED ORDER — METHYLPHENIDATE HCL ER 20 MG PO TBCR: 20.0000 mg | EXTENDED_RELEASE_TABLET | Freq: Every day | ORAL | 0 refills | Status: DC

## 2017-05-23 NOTE — Addendum Note (Signed)
Addended by: Loura Pardon A on: 05/23/2017 08:03 AM   Modules accepted: Orders

## 2017-05-23 NOTE — Telephone Encounter (Signed)
Px printed for pick up in IN box  

## 2017-05-23 NOTE — Telephone Encounter (Signed)
Left voicemail letting pt know Rx ready for pick up 

## 2017-06-17 ENCOUNTER — Encounter: Payer: Self-pay | Admitting: Internal Medicine

## 2017-06-17 ENCOUNTER — Ambulatory Visit (INDEPENDENT_AMBULATORY_CARE_PROVIDER_SITE_OTHER): Payer: BLUE CROSS/BLUE SHIELD | Admitting: Internal Medicine

## 2017-06-17 VITALS — BP 124/82 | HR 68 | Temp 98.5°F | Wt 188.0 lb

## 2017-06-17 DIAGNOSIS — L430 Hypertrophic lichen planus: Secondary | ICD-10-CM | POA: Diagnosis not present

## 2017-06-17 DIAGNOSIS — C4441 Basal cell carcinoma of skin of scalp and neck: Secondary | ICD-10-CM | POA: Diagnosis not present

## 2017-06-17 DIAGNOSIS — R059 Cough, unspecified: Secondary | ICD-10-CM

## 2017-06-17 DIAGNOSIS — R05 Cough: Secondary | ICD-10-CM | POA: Diagnosis not present

## 2017-06-17 MED ORDER — HYDROCOD POLST-CPM POLST ER 10-8 MG/5ML PO SUER: 5.0000 mL | Freq: Every evening | ORAL | 0 refills | Status: DC | PRN

## 2017-06-17 NOTE — Patient Instructions (Signed)

## 2017-06-17 NOTE — Progress Notes (Signed)
HPI  Pt presents to the clinic today with c/o cough. This started 2-3 weeks ago. The cough is productive at times, of thin, white mucous. The cough seems worse at night. He denies runny nose, nasal congestion, ear pain or shortness of breath. He denies fever, chills or body aches. He has tried Mucinex, Sudafed and Dayquil/Nyquil with some relief. He has not had sick contacts. He does have a history of allergies.   Review of Systems      Past Medical History:  Diagnosis Date  . ADD (attention deficit disorder)   . Allergy    allergic rhinitis  . Anxiety   . Bursitis of hip, right   . Depression   . History of meniscal tear    left knee meniscal tear  . Hyperlipidemia     Family History  Problem Relation Age of Onset  . Hypertension Mother   . Depression Mother   . Heart disease Father        CAD  . Heart disease Paternal Uncle        CAD  . COPD Maternal Grandfather     Social History   Socioeconomic History  . Marital status: Married    Spouse name: Not on file  . Number of children: Not on file  . Years of education: Not on file  . Highest education level: Not on file  Social Needs  . Financial resource strain: Not on file  . Food insecurity - worry: Not on file  . Food insecurity - inability: Not on file  . Transportation needs - medical: Not on file  . Transportation needs - non-medical: Not on file  Occupational History  . Not on file  Tobacco Use  . Smoking status: Former Research scientist (life sciences)  . Smokeless tobacco: Never Used  . Tobacco comment: Smoked for about one year 30 years ago  Substance and Sexual Activity  . Alcohol use: Yes    Alcohol/week: 0.0 oz    Comment: occ  . Drug use: No  . Sexual activity: Not on file  Other Topics Concern  . Not on file  Social History Narrative   financial advisor    Allergies  Allergen Reactions  . Aleve [Naproxen Sodium] Swelling    Face and lips swollen and hives  . Omeprazole     REACTION: not effective      Constitutional:  Denies headache, fatigue, fever or abrupt weight changes.  HEENT:  Denies eye redness, eye pain, pressure behind the eyes, facial pain, nasal congestion, ear pain, ringing in the ears, wax buildup, runny nose or sore throat Respiratory: Positive cough. Denies difficulty breathing or shortness of breath.  Cardiovascular: Denies chest pain, chest tightness, palpitations or swelling in the hands or feet.   No other specific complaints in a complete review of systems (except as listed in HPI above).  Objective:   BP 124/82   Pulse 68   Temp 98.5 F (36.9 C) (Oral)   Wt 188 lb (85.3 kg)   SpO2 97%   BMI 29.67 kg/m  Wt Readings from Last 3 Encounters:  06/17/17 188 lb (85.3 kg)  04/22/17 188 lb 8 oz (85.5 kg)  04/15/16 184 lb 12 oz (83.8 kg)     General: Appears his stated age, in NAD. HEENT: Head: normal shape and size, no sinus tenderness noted; Ears: Tm's gray and intact, normal light reflex; Throat/Mouth:  Teeth present, mucosa erythematous and moist, no exudate noted, no lesions or ulcerations noted.  Neck: No cervical  lymphadenopathy.  Pulmonary/Chest: Normal effort and positive vesicular breath sounds. No respiratory distress. No wheezes, rales or ronchi noted.       Assessment & Plan:   Cough:  Likely viral Get some rest and drink plenty of water Continue Mucinex eRx for Tussionex cough syrup  RTC as needed or if symptoms persist.   Webb Silversmith, NP

## 2017-06-18 ENCOUNTER — Encounter: Payer: Self-pay | Admitting: Internal Medicine

## 2017-06-18 ENCOUNTER — Telehealth: Payer: Self-pay | Admitting: *Deleted

## 2017-06-18 NOTE — Telephone Encounter (Signed)
PA for pt's methylphenidate was done at www.covermymeds.com, it was approved and pharmacy notified. Letter placed in Dr. Marliss Coots inbox to sign and send for scanning

## 2017-07-01 DIAGNOSIS — C4441 Basal cell carcinoma of skin of scalp and neck: Secondary | ICD-10-CM | POA: Diagnosis not present

## 2017-07-17 ENCOUNTER — Other Ambulatory Visit: Payer: Self-pay | Admitting: Family Medicine

## 2017-12-01 ENCOUNTER — Other Ambulatory Visit: Payer: Self-pay | Admitting: *Deleted

## 2017-12-01 MED ORDER — TRAMADOL HCL 50 MG PO TABS: 50.0000 mg | ORAL_TABLET | Freq: Two times a day (BID) | ORAL | 0 refills | Status: DC | PRN

## 2017-12-01 NOTE — Telephone Encounter (Signed)
CPE scheduled 05/01/18, last filled on 04/22/17 #30 tabs with 0 refills, please advise

## 2017-12-11 ENCOUNTER — Other Ambulatory Visit: Payer: Self-pay | Admitting: Family Medicine

## 2017-12-11 MED ORDER — METHYLPHENIDATE HCL ER 20 MG PO TBCR: 20.0000 mg | EXTENDED_RELEASE_TABLET | Freq: Every day | ORAL | 0 refills | Status: DC

## 2017-12-11 NOTE — Telephone Encounter (Signed)
Copied from [place] [id number]. Topic: Quick Communication - See Telephone Encounter >> Dec 11, 2017  9:34 AM Conception Chancy, NT wrote: CRM for notification. See Telephone encounter for: 12/11/17.  Patient is needing a refill on methylphenidate (METADATE ER) 20 MG ER tablet. Please contact pt back once this has been called in or if he needs to pick it up in office.   [id number] - [place], [place] [address] [place] [address] Phone: [phone] Fax: [phone]

## 2017-12-11 NOTE — Telephone Encounter (Signed)
Rx refill request: methylphenidate 20 mg ER      Last filled : 05/23/17 # 30  LOV: 04/22/17  PCP: [facility]   Pharmacy: verified

## 2017-12-11 NOTE — Telephone Encounter (Signed)
Will refill electronically  

## 2017-12-31 DIAGNOSIS — L570 Actinic keratosis: Secondary | ICD-10-CM | POA: Diagnosis not present

## 2017-12-31 DIAGNOSIS — L821 Other seborrheic keratosis: Secondary | ICD-10-CM | POA: Diagnosis not present

## 2017-12-31 DIAGNOSIS — Z85828 Personal history of other malignant neoplasm of skin: Secondary | ICD-10-CM | POA: Diagnosis not present

## 2017-12-31 DIAGNOSIS — D1801 Hemangioma of skin and subcutaneous tissue: Secondary | ICD-10-CM | POA: Diagnosis not present

## 2018-04-02 ENCOUNTER — Other Ambulatory Visit: Payer: Self-pay | Admitting: Family Medicine

## 2018-04-02 MED ORDER — METHYLPHENIDATE HCL ER 20 MG PO TBCR: 20.0000 mg | EXTENDED_RELEASE_TABLET | Freq: Every day | ORAL | 0 refills | Status: DC

## 2018-04-02 NOTE — Telephone Encounter (Signed)
Refill of metadate ER  LOV 04/22/17 Dr. Glori Bickers  [facility] 12/11/17  #30  0 refills   [id number] - [place], [place] - [address]          [phone] (Phone) [phone] (Fax)

## 2018-04-02 NOTE — Telephone Encounter (Signed)
Copied from [place] [phone]. Topic: Quick Communication - Rx Refill/Question >> Apr 02, 2018  3:56 PM Mcneil, Ja-Kwan wrote: Medication: methylphenidate (METADATE ER) 20 MG ER tablet  Has the patient contacted their pharmacy? no  Preferred Pharmacy (with phone number or street name): [id number] - [place], [place]. AT [address] [phone] (Phone) [phone] (Fax)  Agent: Please be advised that RX refills may take up to 3 business days. We ask that you follow-up with your pharmacy.

## 2018-04-23 ENCOUNTER — Telehealth: Payer: Self-pay | Admitting: Family Medicine

## 2018-04-23 DIAGNOSIS — Z0000 Encounter for general adult medical examination without abnormal findings: Secondary | ICD-10-CM

## 2018-04-23 DIAGNOSIS — Z125 Encounter for screening for malignant neoplasm of prostate: Secondary | ICD-10-CM

## 2018-04-23 DIAGNOSIS — E538 Deficiency of other specified B group vitamins: Secondary | ICD-10-CM

## 2018-04-23 DIAGNOSIS — R7303 Prediabetes: Secondary | ICD-10-CM

## 2018-04-23 DIAGNOSIS — E7800 Pure hypercholesterolemia, unspecified: Secondary | ICD-10-CM

## 2018-04-23 NOTE — Telephone Encounter (Signed)
-----   Message from Lendon Collar, RT sent at 04/20/2018  9:00 AM EDT ----- Regarding: Lab orders for Tuesday 04/28/18 Please enter CPE lab orders for 04/28/18. Thanks!

## 2018-04-28 ENCOUNTER — Other Ambulatory Visit (INDEPENDENT_AMBULATORY_CARE_PROVIDER_SITE_OTHER): Payer: BLUE CROSS/BLUE SHIELD

## 2018-04-28 DIAGNOSIS — Z125 Encounter for screening for malignant neoplasm of prostate: Secondary | ICD-10-CM

## 2018-04-28 DIAGNOSIS — E538 Deficiency of other specified B group vitamins: Secondary | ICD-10-CM | POA: Diagnosis not present

## 2018-04-28 DIAGNOSIS — E7800 Pure hypercholesterolemia, unspecified: Secondary | ICD-10-CM

## 2018-04-28 DIAGNOSIS — Z0000 Encounter for general adult medical examination without abnormal findings: Secondary | ICD-10-CM | POA: Diagnosis not present

## 2018-04-28 DIAGNOSIS — R7303 Prediabetes: Secondary | ICD-10-CM | POA: Diagnosis not present

## 2018-04-28 LAB — CBC WITH DIFFERENTIAL/PLATELET
Basophils Absolute: 0.1 10*3/uL (ref 0.0–0.1)
Basophils Relative: 1.1 % (ref 0.0–3.0)
Eosinophils Absolute: 0.1 10*3/uL (ref 0.0–0.7)
Eosinophils Relative: 1.4 % (ref 0.0–5.0)
HCT: 44.4 % (ref 39.0–52.0)
Hemoglobin: 15.1 g/dL (ref 13.0–17.0)
Lymphocytes Relative: 26.2 % (ref 12.0–46.0)
Lymphs Abs: 1.2 10*3/uL (ref 0.7–4.0)
MCHC: 34 g/dL (ref 30.0–36.0)
MCV: 90.5 fl (ref 78.0–100.0)
Monocytes Absolute: 0.4 10*3/uL (ref 0.1–1.0)
Monocytes Relative: 8.6 % (ref 3.0–12.0)
Neutro Abs: 2.9 10*3/uL (ref 1.4–7.7)
Neutrophils Relative %: 62.7 % (ref 43.0–77.0)
Platelets: 206 10*3/uL (ref 150.0–400.0)
RBC: 4.91 Mil/uL (ref 4.22–5.81)
RDW: 13.8 % (ref 11.5–15.5)
WBC: 4.7 10*3/uL (ref 4.0–10.5)

## 2018-04-28 LAB — HEMOGLOBIN A1C: Hgb A1c MFr Bld: 6.2 % (ref 4.6–6.5)

## 2018-04-28 LAB — VITAMIN B12: Vitamin B-12: 474 pg/mL (ref 211–911)

## 2018-04-28 LAB — LIPID PANEL
Cholesterol: 168 mg/dL (ref 0–200)
HDL: 51.7 mg/dL (ref >39.00)
LDL Cholesterol: 97 mg/dL (ref 0–99)
NonHDL: 116.71
Total CHOL/HDL Ratio: 3
Triglycerides: 99 mg/dL (ref 0.0–149.0)
VLDL: 19.8 mg/dL (ref 0.0–40.0)

## 2018-04-28 LAB — TSH: TSH: 2.87 u[IU]/mL (ref 0.35–4.50)

## 2018-04-28 LAB — COMPREHENSIVE METABOLIC PANEL
ALT: 25 U/L (ref 0–53)
AST: 20 U/L (ref 0–37)
Albumin: 4.3 g/dL (ref 3.5–5.2)
Alkaline Phosphatase: 47 U/L (ref 39–117)
BUN: 14 mg/dL (ref 6–23)
CO2: 28 mEq/L (ref 19–32)
Calcium: 9.3 mg/dL (ref 8.4–10.5)
Chloride: 103 mEq/L (ref 96–112)
Creatinine, Ser: 0.93 mg/dL (ref 0.40–1.50)
GFR: 85.44 mL/min (ref >60.00)
Glucose, Bld: 117 mg/dL — ABNORMAL HIGH (ref 70–99)
Potassium: 4.3 mEq/L (ref 3.5–5.1)
Sodium: 138 mEq/L (ref 135–145)
Total Bilirubin: 0.9 mg/dL (ref 0.2–1.2)
Total Protein: 6.6 g/dL (ref 6.0–8.3)

## 2018-04-28 LAB — PSA: PSA: 1.53 ng/mL (ref 0.10–4.00)

## 2018-05-01 ENCOUNTER — Ambulatory Visit (INDEPENDENT_AMBULATORY_CARE_PROVIDER_SITE_OTHER): Payer: BLUE CROSS/BLUE SHIELD | Admitting: Family Medicine

## 2018-05-01 ENCOUNTER — Encounter: Payer: Self-pay | Admitting: Family Medicine

## 2018-05-01 VITALS — BP 132/84 | HR 61 | Temp 98.3°F | Ht 67.0 in | Wt 178.5 lb

## 2018-05-01 DIAGNOSIS — E538 Deficiency of other specified B group vitamins: Secondary | ICD-10-CM

## 2018-05-01 DIAGNOSIS — R7303 Prediabetes: Secondary | ICD-10-CM

## 2018-05-01 DIAGNOSIS — Z23 Encounter for immunization: Secondary | ICD-10-CM | POA: Diagnosis not present

## 2018-05-01 DIAGNOSIS — F988 Other specified behavioral and emotional disorders with onset usually occurring in childhood and adolescence: Secondary | ICD-10-CM | POA: Diagnosis not present

## 2018-05-01 DIAGNOSIS — K219 Gastro-esophageal reflux disease without esophagitis: Secondary | ICD-10-CM | POA: Diagnosis not present

## 2018-05-01 DIAGNOSIS — E7800 Pure hypercholesterolemia, unspecified: Secondary | ICD-10-CM

## 2018-05-01 DIAGNOSIS — R739 Hyperglycemia, unspecified: Secondary | ICD-10-CM

## 2018-05-01 DIAGNOSIS — Z0000 Encounter for general adult medical examination without abnormal findings: Secondary | ICD-10-CM

## 2018-05-01 DIAGNOSIS — Z125 Encounter for screening for malignant neoplasm of prostate: Secondary | ICD-10-CM

## 2018-05-01 MED ORDER — PANTOPRAZOLE SODIUM 40 MG PO TBEC: 40.0000 mg | DELAYED_RELEASE_TABLET | Freq: Every day | ORAL | 3 refills | Status: DC

## 2018-05-01 MED ORDER — METHYLPHENIDATE HCL ER 20 MG PO TBCR: 20.0000 mg | EXTENDED_RELEASE_TABLET | Freq: Every day | ORAL | 0 refills | Status: DC

## 2018-05-01 MED ORDER — ROSUVASTATIN CALCIUM 40 MG PO TABS: 40.0000 mg | ORAL_TABLET | Freq: Every day | ORAL | 3 refills | Status: DC

## 2018-05-01 MED ORDER — SILDENAFIL CITRATE 20 MG PO TABS: ORAL_TABLET | ORAL | 5 refills | Status: DC

## 2018-05-01 NOTE — Progress Notes (Signed)
Subjective:    Patient ID: [place] Bullock, male    DOB: 10/21/1950, 67 y.o.   MRN: [id number]  HPI Here for health maintenance exam and to review chronic medical problems    Still working  Not plans for retirement   A little cough and congestion  He is trying to get more rest    Wt Readings from Last 3 Encounters:  05/01/18 178 lb 8 oz (81 kg)  06/17/17 188 lb (85.3 kg)  04/22/17 188 lb 8 oz (85.5 kg)  down 10 lb from a year ago  He cut back on processed food and sweets  Exercise - walking almost daily  / has done a few yoga classes and likes it  27.96 kg/m   Flu shot- wants today   Colon cancer screening -ins does not cover colonoscopy  ? If cologuard is covered   Tetanus shot 9/12  utd on pneumonia vaccines   zostavax 11/14   GERD- still on protonix  Has lost wt  Has 4 cups of coffee per day (some decaf)  Supplements B12   Prostate health Nocturia - usually once  Drinks a lot of water during the day  No change in flow/stream  Lab Results  Component Value Date   PSA 1.53 04/28/2018   PSA 1.77 04/14/2017   PSA 1.53 04/11/2016    Blood pressure  BP Readings from Last 3 Encounters:  05/01/18 132/84  06/17/17 124/82  04/22/17 133/70   Pulse Readings from Last 3 Encounters:  05/01/18 61  06/17/17 68  04/22/17 61    Prediabetes Lab Results  Component Value Date   HGBA1C 6.2 04/28/2018  glucose of 117   Hyperlipidemia Lab Results  Component Value Date   CHOL 168 04/28/2018   CHOL 186 04/14/2017   CHOL 175 04/11/2016   Lab Results  Component Value Date   HDL 51.70 04/28/2018   HDL 52.20 04/14/2017   HDL 48.20 04/11/2016   Lab Results  Component Value Date   LDLCALC 97 04/28/2018   LDLCALC 116 (H) 04/14/2017   LDLCALC 102 (H) 04/11/2016   Lab Results  Component Value Date   TRIG 99.0 04/28/2018   TRIG 89.0 04/14/2017   TRIG 126.0 04/11/2016   Lab Results  Component Value Date   CHOLHDL 3 04/28/2018   CHOLHDL 4 04/14/2017   CHOLHDL 4 04/11/2016   Lab Results  Component Value Date   LDLDIRECT 130.9 10/25/2008   LDLDIRECT 141.4 03/16/2007   LDLDIRECT 139.6 09/18/2006  well controlled with statin and diet    B12 def Lab Results  Component Value Date   VITAMINB12 474 04/28/2018  nl with supplementation   ADD- doing fair on current medication/ metadate  Patient Active Problem List   Diagnosis Date Noted  . Prediabetes 05/22/2017  . B12 deficiency 04/22/2017  . Blood glucose elevated 04/22/2017  . Current use of proton pump inhibitor 04/15/2016  . Anxiety 08/20/2013  . Colon cancer screening 03/31/2012  . Allergic drug reaction 01/29/2012  . Back pain 02/05/2011  . Routine general medical examination at a health care facility 11/08/2010  . Prostate cancer screening 11/08/2010  . GERD 09/20/2009  . ANXIETY STATE NOS 01/08/2007  . Attention deficit disorder 01/08/2007  . Hyperlipidemia 01/07/2007  . ERECTILE DYSFUNCTION 01/07/2007  . DEPRESSION 01/07/2007  . Allergic rhinitis 01/07/2007   Past Medical History:  Diagnosis Date  . ADD (attention deficit disorder)   . Allergy    allergic rhinitis  . Anxiety   .  Bursitis of hip, right   . Depression   . History of meniscal tear    left knee meniscal tear  . Hyperlipidemia    History reviewed. No pertinent surgical history. Social History   Tobacco Use  . Smoking status: Former Research scientist (life sciences)  . Smokeless tobacco: Never Used  . Tobacco comment: Smoked for about one year 30 years ago  Substance Use Topics  . Alcohol use: Yes    Alcohol/week: 0.0 standard drinks    Comment: occ  . Drug use: No   Family History  Problem Relation Age of Onset  . Hypertension Mother   . Depression Mother   . Heart disease Father        CAD  . Heart disease Paternal Uncle        CAD  . COPD Maternal Grandfather    Allergies  Allergen Reactions  . Aleve [Naproxen Sodium] Swelling    Face and lips swollen and hives  . Omeprazole     REACTION: not  effective   Current Outpatient Medications on File Prior to Visit  Medication Sig Dispense Refill  . traMADol (ULTRAM) 50 MG tablet Take 1 tablet (50 mg total) by mouth 2 (two) times daily as needed. 30 tablet 0   No current facility-administered medications on file prior to visit.      Review of Systems  Constitutional: Negative for activity change, appetite change, fatigue, fever and unexpected weight change.  HENT: Negative for congestion, rhinorrhea, sore throat and trouble swallowing.   Eyes: Negative for pain, redness, itching and visual disturbance.  Respiratory: Negative for cough, chest tightness, shortness of breath and wheezing.   Cardiovascular: Negative for chest pain and palpitations.  Gastrointestinal: Negative for abdominal pain, blood in stool, constipation, diarrhea and nausea.  Endocrine: Negative for cold intolerance, heat intolerance, polydipsia and polyuria.  Genitourinary: Negative for difficulty urinating, dysuria, frequency and urgency.  Musculoskeletal: Negative for arthralgias, joint swelling and myalgias.  Skin: Negative for pallor and rash.  Neurological: Negative for dizziness, tremors, weakness, numbness and headaches.  Hematological: Negative for adenopathy. Does not bruise/bleed easily.  Psychiatric/Behavioral: Negative for decreased concentration and dysphoric mood. The patient is not nervous/anxious.        Objective:   Physical Exam  Constitutional: He appears well-developed and well-nourished. No distress.  Well appearing  HENT:  Head: Normocephalic and atraumatic.  Right Ear: External ear normal.  Left Ear: External ear normal.  Nose: Nose normal.  Mouth/Throat: Oropharynx is clear and moist.  Eyes: Pupils are equal, round, and reactive to light. Conjunctivae and EOM are normal. Right eye exhibits no discharge. Left eye exhibits no discharge. No scleral icterus.  Neck: Normal range of motion. Neck supple. No JVD present. Carotid bruit is not  present. No thyromegaly present.  Cardiovascular: Normal rate, regular rhythm, normal heart sounds and intact distal pulses. Exam reveals no gallop.  Pulmonary/Chest: Effort normal and breath sounds normal. No respiratory distress. He has no wheezes. He has no rales. He exhibits no tenderness.  Abdominal: Soft. Bowel sounds are normal. He exhibits no distension, no abdominal bruit and no mass. There is no tenderness.  Musculoskeletal: He exhibits no edema or tenderness.  No kyphosis   Lymphadenopathy:    He has no cervical adenopathy.  Neurological: He is alert. He has normal reflexes. He displays normal reflexes. No cranial nerve deficit. He exhibits normal muscle tone. Coordination normal.  Skin: Skin is warm and dry. No rash noted. No erythema. No pallor.  Solar lentigines diffusely  Psychiatric: He has a normal mood and affect.          Assessment & Plan:   Problem List Items Addressed This Visit      Digestive   GERD    Continues PPI Disc imp of diet (esp coffee avoidance) eventually if he wants to get off PPI as well as lifestyle change       Relevant Medications   pantoprazole (PROTONIX) 40 MG tablet     Other   Attention deficit disorder    Continues metadate ER No changes       B12 deficiency    Doing well with current supplementation  Lab Results  Component Value Date   ZMCEYEMV36 122 04/28/2018         Hyperlipidemia    Disc goals for lipids and reasons to control them Rev last labs with pt Rev low sat fat diet in detail Doing well with crestor and diet       Relevant Medications   rosuvastatin (CRESTOR) 40 MG tablet   sildenafil (REVATIO) 20 MG tablet   Prediabetes    Lab Results  Component Value Date   HGBA1C 6.2 04/28/2018   disc imp of low glycemic diet and wt loss to prevent DM2       Prostate cancer screening    Lab Results  Component Value Date   PSA 1.53 04/28/2018   PSA 1.77 04/14/2017   PSA 1.53 04/11/2016   No change in  voiding hx or fam hx Continue to follow       Routine general medical examination at a health care facility - Primary    Reviewed health habits including diet and exercise and skin cancer prevention Reviewed appropriate screening tests for age  Also reviewed health mt list, fam hx and immunization status , as well as social and family history   See HPI Labs reviewed  Flu shot today Disc lifestyle and self care  Disc shingrix -enc to get on wt list  Enc strongly to consider cologuard kit-he will check with ins and let us know if it is covered (his ins will not cover colonoscopy)  If not- will send ifob kit       Other Visit Diagnoses    Blood glucose elevated       Need for influenza vaccination       Relevant Orders   Flu Vaccine QUAD 6+ mos PF IM (Fluarix Quad PF) (Completed)

## 2018-05-01 NOTE — Patient Instructions (Addendum)
I would like to do the cologuard kit for colon cancer screening  If your insurance does not pay for it- we will order a cheaper test (ifob) Please call your insurance and let me know   If you are interested in the new shingles vaccine (Shingrix) - call your local pharmacy to check on coverage and availability  If affordable, get on a wait list at your pharmacy to get the vaccine.  Labs look good  The "prediabetes" is stable - re assuring   Try to get most of your carbohydrates from produce (with the exception of white potatoes)  Eat less bread/pasta/rice/snack foods/cereals/sweets and other items from the middle of the grocery store (processed carbs)  Keep walking and doing yoga

## 2018-05-03 NOTE — Assessment & Plan Note (Signed)
Continues metadate ER No changes

## 2018-05-03 NOTE — Assessment & Plan Note (Signed)
Disc goals for lipids and reasons to control them Rev last labs with pt Rev low sat fat diet in detail Doing well with crestor and diet

## 2018-05-03 NOTE — Assessment & Plan Note (Signed)
Doing well with current supplementation  Lab Results  Component Value Date   VITAMINB12 474 04/28/2018

## 2018-05-03 NOTE — Assessment & Plan Note (Signed)
Lab Results  Component Value Date   HGBA1C 6.2 04/28/2018   disc imp of low glycemic diet and wt loss to prevent DM2

## 2018-05-03 NOTE — Assessment & Plan Note (Signed)
Continues PPI Disc imp of diet (esp coffee avoidance) eventually if he wants to get off PPI as well as lifestyle change

## 2018-05-03 NOTE — Assessment & Plan Note (Signed)
Reviewed health habits including diet and exercise and skin cancer prevention Reviewed appropriate screening tests for age  Also reviewed health mt list, fam hx and immunization status , as well as social and family history   See HPI Labs reviewed  Flu shot today Disc lifestyle and self care  Disc shingrix -enc to get on wt list  Enc strongly to consider cologuard kit-he will check with ins and let us know if it is covered (his ins will not cover colonoscopy)  If not- will send ifob kit

## 2018-05-03 NOTE — Assessment & Plan Note (Signed)
Lab Results  Component Value Date   PSA 1.53 04/28/2018   PSA 1.77 04/14/2017   PSA 1.53 04/11/2016   No change in voiding hx or fam hx Continue to follow

## 2018-06-09 DIAGNOSIS — M7061 Trochanteric bursitis, right hip: Secondary | ICD-10-CM | POA: Diagnosis not present

## 2018-06-09 DIAGNOSIS — M7062 Trochanteric bursitis, left hip: Secondary | ICD-10-CM | POA: Diagnosis not present

## 2018-06-09 DIAGNOSIS — M545 Low back pain: Secondary | ICD-10-CM | POA: Diagnosis not present

## 2018-07-07 DIAGNOSIS — M25511 Pain in right shoulder: Secondary | ICD-10-CM | POA: Diagnosis not present

## 2018-07-07 DIAGNOSIS — M25552 Pain in left hip: Secondary | ICD-10-CM | POA: Diagnosis not present

## 2018-07-07 DIAGNOSIS — M25551 Pain in right hip: Secondary | ICD-10-CM | POA: Diagnosis not present

## 2018-07-09 ENCOUNTER — Ambulatory Visit: Payer: BLUE CROSS/BLUE SHIELD | Admitting: Family Medicine

## 2018-07-09 ENCOUNTER — Encounter: Payer: Self-pay | Admitting: Family Medicine

## 2018-07-09 VITALS — BP 112/76 | HR 77 | Temp 98.5°F | Ht 67.0 in | Wt 181.0 lb

## 2018-07-09 DIAGNOSIS — K219 Gastro-esophageal reflux disease without esophagitis: Secondary | ICD-10-CM | POA: Diagnosis not present

## 2018-07-09 DIAGNOSIS — R110 Nausea: Secondary | ICD-10-CM

## 2018-07-09 LAB — HEPATIC FUNCTION PANEL
ALT: 29 U/L (ref 0–53)
AST: 23 U/L (ref 0–37)
Albumin: 4.5 g/dL (ref 3.5–5.2)
Alkaline Phosphatase: 49 U/L (ref 39–117)
Bilirubin, Direct: 0.1 mg/dL (ref 0.0–0.3)
Total Bilirubin: 0.5 mg/dL (ref 0.2–1.2)
Total Protein: 7.2 g/dL (ref 6.0–8.3)

## 2018-07-09 LAB — CBC WITH DIFFERENTIAL/PLATELET
Basophils Absolute: 0 10*3/uL (ref 0.0–0.1)
Basophils Relative: 1 % (ref 0.0–3.0)
Eosinophils Absolute: 0.1 10*3/uL (ref 0.0–0.7)
Eosinophils Relative: 1.3 % (ref 0.0–5.0)
HCT: 46.4 % (ref 39.0–52.0)
Hemoglobin: 15.8 g/dL (ref 13.0–17.0)
Lymphocytes Relative: 19.7 % (ref 12.0–46.0)
Lymphs Abs: 0.9 10*3/uL (ref 0.7–4.0)
MCHC: 34.1 g/dL (ref 30.0–36.0)
MCV: 91.7 fl (ref 78.0–100.0)
Monocytes Absolute: 0.5 10*3/uL (ref 0.1–1.0)
Monocytes Relative: 10.6 % (ref 3.0–12.0)
Neutro Abs: 2.9 10*3/uL (ref 1.4–7.7)
Neutrophils Relative %: 67.4 % (ref 43.0–77.0)
Platelets: 206 10*3/uL (ref 150.0–400.0)
RBC: 5.06 Mil/uL (ref 4.22–5.81)
RDW: 13.9 % (ref 11.5–15.5)
WBC: 4.3 10*3/uL (ref 4.0–10.5)

## 2018-07-09 LAB — BASIC METABOLIC PANEL
BUN: 15 mg/dL (ref 6–23)
CO2: 28 mEq/L (ref 19–32)
Calcium: 9.3 mg/dL (ref 8.4–10.5)
Chloride: 100 mEq/L (ref 96–112)
Creatinine, Ser: 0.86 mg/dL (ref 0.40–1.50)
GFR: 93.47 mL/min (ref >60.00)
Glucose, Bld: 99 mg/dL (ref 70–99)
Potassium: 4.1 mEq/L (ref 3.5–5.1)
Sodium: 136 mEq/L (ref 135–145)

## 2018-07-09 LAB — LIPASE: Lipase: 14 U/L (ref 11.0–59.0)

## 2018-07-09 NOTE — Patient Instructions (Signed)
Please back off coffee If you cannot quit at once then cut back to 1 cup daily for a week and then stop it   Also - cut back alcohol as much as you can   Also watch out for other symptoms -fever /abdominal pain or anything else  Avoid nsaids (ibuprofen Tori Milks)   Labs today for nausea   Eat regular meals Avoid spicy or very fatty foods    Continue protonix and other medicines

## 2018-07-09 NOTE — Assessment & Plan Note (Signed)
Unsure if causing nausea  Disc avoidance of spicy food  Continue protonix  Cut back on with intent to quit coffee - ? If worsening am symptoms  Also cut back on etoh if this worsens symptoms

## 2018-07-09 NOTE — Assessment & Plan Note (Signed)
In pt with GERD on protonix  No assoc symptoms  ? If worsened by coffee-will cut back with intent to quit  Also adv cutting etoh intake as well (2 glasses of wine per day currently)  Lab today incl chem/cbc/lipase  Update if worse or no improvement

## 2018-07-09 NOTE — Progress Notes (Signed)
Subjective:    Patient ID: Benjamin Bullock, male    DOB: [birth year], 67 y.o.   MRN: [id number]  HPI  Here for nausea (intermittent) for 2 weeks  This am - lingered  No vomiting  No dizziness or weakness  No fever   Worse in am and better as the day progresses   Drinks coffee in am  Lots of water during the day   No stomach pain or burning   Had 2 cortisone shots in each hip (symptoms started before that)  Also some rotator cuff issues  Also takes tramadol as needed (has never made him nauseated) -does make him foggy   No new meds or herbs   No nsaids   Stress level -no different from his every day   No stool change   Eating regularly  Eating may make it a bit better (yogurt) Walking for exercise   Alcohol intake  2 glasses of wine per day (occ 2 1/2)   Coffee 2 cups in the am  ? If this makes symptoms worse    Wt Readings from Last 3 Encounters:  07/09/18 181 lb (82.1 kg)  05/01/18 178 lb 8 oz (81 kg)  06/17/17 188 lb (85.3 kg)   28.35 kg/m    H/o GERD No symptoms / well controlled  ppi-protonix daily   Patient Active Problem List   Diagnosis Date Noted  . Nausea 07/09/2018  . Prediabetes 05/22/2017  . B12 deficiency 04/22/2017  . Current use of proton pump inhibitor 04/15/2016  . Anxiety 08/20/2013  . Colon cancer screening 03/31/2012  . Allergic drug reaction 01/29/2012  . Back pain 02/05/2011  . Routine general medical examination at a health care facility 11/08/2010  . Prostate cancer screening 11/08/2010  . GERD 09/20/2009  . ANXIETY STATE NOS 01/08/2007  . Attention deficit disorder 01/08/2007  . Hyperlipidemia 01/07/2007  . ERECTILE DYSFUNCTION 01/07/2007  . DEPRESSION 01/07/2007  . Allergic rhinitis 01/07/2007   Past Medical History:  Diagnosis Date  . ADD (attention deficit disorder)   . Allergy    allergic rhinitis  . Anxiety   . Bursitis of hip, right   . Depression   . History of meniscal tear    left knee meniscal tear   . Hyperlipidemia    History reviewed. No pertinent surgical history. Social History   Tobacco Use  . Smoking status: Former Research scientist (life sciences)  . Smokeless tobacco: Never Used  . Tobacco comment: Smoked for about one year 30 years ago  Substance Use Topics  . Alcohol use: Yes    Alcohol/week: 0.0 standard drinks    Comment: occ  . Drug use: No   Family History  Problem Relation Age of Onset  . Hypertension Mother   . Depression Mother   . Heart disease Father        CAD  . Heart disease Paternal Uncle        CAD  . COPD Maternal Grandfather    Allergies  Allergen Reactions  . Aleve [Naproxen Sodium] Swelling    Face and lips swollen and hives  . Omeprazole     REACTION: not effective   Current Outpatient Medications on File Prior to Visit  Medication Sig Dispense Refill  . methylphenidate (METADATE ER) 20 MG ER tablet Take 1 tablet (20 mg total) by mouth daily. 30 tablet 0  . pantoprazole (PROTONIX) 40 MG tablet Take 1 tablet (40 mg total) by mouth daily. 90 tablet 3  . rosuvastatin (CRESTOR) 40  MG tablet Take 1 tablet (40 mg total) by mouth daily. 90 tablet 3  . sildenafil (REVATIO) 20 MG tablet TAKE 3 TABLETS BY MOUTH AS NEEDED FOR 30 MINUTES BEFORE SEXUAL ACTIVITY 30 tablet 5  . traMADol (ULTRAM) 50 MG tablet Take 1 tablet (50 mg total) by mouth 2 (two) times daily as needed. 30 tablet 0   No current facility-administered medications on file prior to visit.     Review of Systems  Constitutional: Negative for activity change, appetite change, fatigue, fever and unexpected weight change.  HENT: Negative for congestion, postnasal drip, rhinorrhea, sore throat, trouble swallowing and voice change.   Eyes: Negative for pain, redness, itching and visual disturbance.  Respiratory: Negative for cough, chest tightness, shortness of breath and wheezing.   Cardiovascular: Negative for chest pain and palpitations.  Gastrointestinal: Positive for nausea. Negative for abdominal distention,  abdominal pain, anal bleeding, blood in stool, constipation, diarrhea, rectal pain and vomiting.  Endocrine: Negative for cold intolerance, heat intolerance, polydipsia and polyuria.  Genitourinary: Negative for difficulty urinating, dysuria, frequency and urgency.  Musculoskeletal: Negative for arthralgias, joint swelling and myalgias.  Skin: Negative for pallor and rash.  Neurological: Negative for dizziness, tremors, weakness, numbness and headaches.  Hematological: Negative for adenopathy. Does not bruise/bleed easily.  Psychiatric/Behavioral: Negative for decreased concentration and dysphoric mood. The patient is not nervous/anxious.        Objective:   Physical Exam Constitutional:      General: He is not in acute distress.    Appearance: Normal appearance. He is well-developed. He is not ill-appearing or diaphoretic.  HENT:     Head: Normocephalic and atraumatic.     Nose: Nose normal. No rhinorrhea.     Mouth/Throat:     Mouth: Mucous membranes are moist.     Pharynx: Oropharynx is clear.  Eyes:     General: No scleral icterus.    Conjunctiva/sclera: Conjunctivae normal.     Pupils: Pupils are equal, round, and reactive to light.  Neck:     Musculoskeletal: Normal range of motion and neck supple.  Cardiovascular:     Rate and Rhythm: Normal rate and regular rhythm.     Pulses: Normal pulses.     Heart sounds: Normal heart sounds.  Pulmonary:     Effort: Pulmonary effort is normal. No respiratory distress.     Breath sounds: Normal breath sounds. No stridor. No wheezing, rhonchi or rales.  Abdominal:     General: Abdomen is flat. Bowel sounds are normal. There is no distension.     Palpations: Abdomen is soft. There is no hepatomegaly, splenomegaly, mass or pulsatile mass.     Tenderness: There is no abdominal tenderness. There is no right CVA tenderness, left CVA tenderness, guarding or rebound. Negative signs include Murphy's sign.     Hernia: No hernia is present.    Lymphadenopathy:     Cervical: No cervical adenopathy.  Skin:    General: Skin is warm and dry.     Coloration: Skin is not jaundiced or pale.     Findings: No erythema.  Neurological:     General: No focal deficit present.     Mental Status: He is alert.  Psychiatric:        Mood and Affect: Mood normal.           Assessment & Plan:   Problem List Items Addressed This Visit      Digestive   GERD    Unsure if causing nausea  Disc avoidance of spicy food  Continue protonix  Cut back on with intent to quit coffee - ? If worsening am symptoms  Also cut back on etoh if this worsens symptoms         Other   Nausea - Primary    In pt with GERD on protonix  No assoc symptoms  ? If worsened by coffee-will cut back with intent to quit  Also adv cutting etoh intake as well (2 glasses of wine per day currently)  Lab today incl chem/cbc/lipase  Update if worse or no improvement       Relevant Orders   CBC with Differential/Platelet (Completed)   Basic metabolic panel (Completed)   Hepatic function panel (Completed)   Lipase (Completed)

## 2018-07-31 DIAGNOSIS — D1801 Hemangioma of skin and subcutaneous tissue: Secondary | ICD-10-CM | POA: Diagnosis not present

## 2018-07-31 DIAGNOSIS — D225 Melanocytic nevi of trunk: Secondary | ICD-10-CM | POA: Diagnosis not present

## 2018-07-31 DIAGNOSIS — Z85828 Personal history of other malignant neoplasm of skin: Secondary | ICD-10-CM | POA: Diagnosis not present

## 2018-07-31 DIAGNOSIS — D045 Carcinoma in situ of skin of trunk: Secondary | ICD-10-CM | POA: Diagnosis not present

## 2018-07-31 DIAGNOSIS — L570 Actinic keratosis: Secondary | ICD-10-CM | POA: Diagnosis not present

## 2018-07-31 DIAGNOSIS — L821 Other seborrheic keratosis: Secondary | ICD-10-CM | POA: Diagnosis not present

## 2018-08-18 ENCOUNTER — Ambulatory Visit: Payer: BLUE CROSS/BLUE SHIELD | Admitting: Family Medicine

## 2018-08-18 ENCOUNTER — Encounter: Payer: Self-pay | Admitting: Family Medicine

## 2018-08-18 VITALS — BP 106/64 | HR 76 | Temp 98.0°F | Ht 67.0 in

## 2018-08-18 DIAGNOSIS — R110 Nausea: Secondary | ICD-10-CM | POA: Diagnosis not present

## 2018-08-18 DIAGNOSIS — R1030 Lower abdominal pain, unspecified: Secondary | ICD-10-CM | POA: Diagnosis not present

## 2018-08-18 DIAGNOSIS — K219 Gastro-esophageal reflux disease without esophagitis: Secondary | ICD-10-CM

## 2018-08-18 NOTE — Progress Notes (Signed)
Subjective:    Patient ID: Benjamin Bullock, male    DOB: 06-Feb-1951, 68 y.o.   MRN: [id number]  HPI Here for c/o nausea and abdominal pain   Gets 'twinges" in lower abdomen Dull pain - fleeting  To the right of his belly button   Nausea improved and then came back (goes and comes)  This weekend it bothered him    Mother had diverticulitis  No fam hx of    Coffee intake -tapered down - improved for a while (nausea)  1 or less cups per day   Eats light breakfast- toast and oatmeal with fruit Yogurt at work  Peanut butter for protein  Feels better after eating at times   Perhaps an empty stomach makes it worse   No upper abdominal pain  No burning sensation  No heartburn (watches diet for it)  No nsaids  Takes protonix 40 mg daily     BP Readings from Last 3 Encounters:  08/18/18 106/64  07/09/18 112/76  05/01/18 132/84   Never had a colonoscopy  No good reason- afraid of it    Alcohol intake - still drinking 2 drinks per day (wine)  Does not think it makes him worse   Results for orders placed or performed in visit on 07/09/18  CBC with Differential/Platelet  Result Value Ref Range   WBC 4.3 4.0 - 10.5 K/uL   RBC 5.06 4.22 - 5.81 Mil/uL   Hemoglobin 15.8 13.0 - 17.0 g/dL   HCT 46.4 39.0 - 52.0 %   MCV 91.7 78.0 - 100.0 fl   MCHC 34.1 30.0 - 36.0 g/dL   RDW 13.9 11.5 - 15.5 %   Platelets 206.0 150.0 - 400.0 K/uL   Neutrophils Relative % 67.4 43.0 - 77.0 %   Lymphocytes Relative 19.7 12.0 - 46.0 %   Monocytes Relative 10.6 3.0 - 12.0 %   Eosinophils Relative 1.3 0.0 - 5.0 %   Basophils Relative 1.0 0.0 - 3.0 %   Neutro Abs 2.9 1.4 - 7.7 K/uL   Lymphs Abs 0.9 0.7 - 4.0 K/uL   Monocytes Absolute 0.5 0.1 - 1.0 K/uL   Eosinophils Absolute 0.1 0.0 - 0.7 K/uL   Basophils Absolute 0.0 0.0 - 0.1 K/uL  Basic metabolic panel  Result Value Ref Range   Sodium 136 135 - 145 mEq/L   Potassium 4.1 3.5 - 5.1 mEq/L   Chloride 100 96 - 112 mEq/L   CO2 28 19 -  32 mEq/L   Glucose, Bld 99 70 - 99 mg/dL   BUN 15 6 - 23 mg/dL   Creatinine, Ser 0.86 0.40 - 1.50 mg/dL   Calcium 9.3 8.4 - 10.5 mg/dL   GFR 93.47 >60.00 mL/min  Hepatic function panel  Result Value Ref Range   Total Bilirubin 0.5 0.2 - 1.2 mg/dL   Bilirubin, Direct 0.1 0.0 - 0.3 mg/dL   Alkaline Phosphatase 49 39 - 117 U/L   AST 23 0 - 37 U/L   ALT 29 0 - 53 U/L   Total Protein 7.2 6.0 - 8.3 g/dL   Albumin 4.5 3.5 - 5.2 g/dL  Lipase  Result Value Ref Range   Lipase 14.0 11.0 - 59.0 U/L    Patient Active Problem List   Diagnosis Date Noted  . Lower abdominal pain 08/18/2018  . Nausea 07/09/2018  . Prediabetes 05/22/2017  . B12 deficiency 04/22/2017  . Current use of proton pump inhibitor 04/15/2016  . Anxiety 08/20/2013  . Colon  cancer screening 03/31/2012  . Allergic drug reaction 01/29/2012  . Back pain 02/05/2011  . Routine general medical examination at a health care facility 11/08/2010  . Prostate cancer screening 11/08/2010  . GERD 09/20/2009  . ANXIETY STATE NOS 01/08/2007  . Attention deficit disorder 01/08/2007  . Hyperlipidemia 01/07/2007  . ERECTILE DYSFUNCTION 01/07/2007  . DEPRESSION 01/07/2007  . Allergic rhinitis 01/07/2007   Past Medical History:  Diagnosis Date  . ADD (attention deficit disorder)   . Allergy    allergic rhinitis  . Anxiety   . Bursitis of hip, right   . Depression   . History of meniscal tear    left knee meniscal tear  . Hyperlipidemia    History reviewed. No pertinent surgical history. Social History   Tobacco Use  . Smoking status: Former Research scientist (life sciences)  . Smokeless tobacco: Never Used  . Tobacco comment: Smoked for about one year 30 years ago  Substance Use Topics  . Alcohol use: Yes    Alcohol/week: 0.0 standard drinks    Comment: occ  . Drug use: No   Family History  Problem Relation Age of Onset  . Hypertension Mother   . Depression Mother   . Heart disease Father        CAD  . Heart disease Paternal Uncle         CAD  . COPD Maternal Grandfather    Allergies  Allergen Reactions  . Aleve [Naproxen Sodium] Swelling    Face and lips swollen and hives  . Omeprazole     REACTION: not effective   Current Outpatient Medications on File Prior to Visit  Medication Sig Dispense Refill  . methylphenidate (METADATE ER) 20 MG ER tablet Take 1 tablet (20 mg total) by mouth daily. 30 tablet 0  . pantoprazole (PROTONIX) 40 MG tablet Take 1 tablet (40 mg total) by mouth daily. 90 tablet 3  . rosuvastatin (CRESTOR) 40 MG tablet Take 1 tablet (40 mg total) by mouth daily. 90 tablet 3  . sildenafil (REVATIO) 20 MG tablet TAKE 3 TABLETS BY MOUTH AS NEEDED FOR 30 MINUTES BEFORE SEXUAL ACTIVITY 30 tablet 5  . traMADol (ULTRAM) 50 MG tablet Take 1 tablet (50 mg total) by mouth 2 (two) times daily as needed. 30 tablet 0   No current facility-administered medications on file prior to visit.     Review of Systems  Constitutional: Negative for activity change, appetite change, fatigue, fever and unexpected weight change.  HENT: Negative for congestion, rhinorrhea, sore throat and trouble swallowing.   Eyes: Negative for pain, redness, itching and visual disturbance.  Respiratory: Negative for cough, chest tightness, shortness of breath and wheezing.   Cardiovascular: Negative for chest pain and palpitations.  Gastrointestinal: Positive for abdominal pain and nausea. Negative for abdominal distention, anal bleeding, blood in stool, constipation, diarrhea, rectal pain and vomiting.  Endocrine: Negative for cold intolerance, heat intolerance, polydipsia and polyuria.  Genitourinary: Negative for difficulty urinating, dysuria, frequency and urgency.  Musculoskeletal: Negative for arthralgias, joint swelling and myalgias.  Skin: Negative for pallor and rash.  Neurological: Negative for dizziness, tremors, weakness, numbness and headaches.  Hematological: Negative for adenopathy. Does not bruise/bleed easily.    Psychiatric/Behavioral: Negative for decreased concentration and dysphoric mood. The patient is not nervous/anxious.        Stressors        Objective:   Physical Exam Constitutional:      General: He is not in acute distress.    Appearance: He is  well-developed. He is not ill-appearing.  HENT:     Head: Normocephalic and atraumatic.  Eyes:     General: No scleral icterus.    Conjunctiva/sclera: Conjunctivae normal.     Pupils: Pupils are equal, round, and reactive to light.  Neck:     Musculoskeletal: Normal range of motion and neck supple.  Cardiovascular:     Rate and Rhythm: Normal rate and regular rhythm.     Pulses: Normal pulses.     Heart sounds: Normal heart sounds.  Pulmonary:     Effort: Pulmonary effort is normal. No respiratory distress.     Breath sounds: Normal breath sounds. No wheezing or rales.  Abdominal:     General: Bowel sounds are normal. There is no distension.     Palpations: Abdomen is soft. There is no mass.     Tenderness: There is abdominal tenderness in the right lower quadrant, suprapubic area and left lower quadrant. There is no right CVA tenderness, left CVA tenderness, guarding or rebound. Negative signs include Murphy's sign and McBurney's sign.  Lymphadenopathy:     Cervical: No cervical adenopathy.  Skin:    General: Skin is warm and dry.     Coloration: Skin is not jaundiced or pale.     Findings: No erythema.  Neurological:     Mental Status: He is alert. Mental status is at baseline.     Coordination: Coordination normal.     Deep Tendon Reflexes: Reflexes normal.  Psychiatric:        Mood and Affect: Mood is anxious.     Comments: Anxious mood            Assessment & Plan:   Problem List Items Addressed This Visit      Digestive   GERD    Urged to reduce caffeine and etoh  Continue protonix Rev prior labs  Ref to GI (? If gerd is causing chronic nausea)      Relevant Orders   Ambulatory referral to  Gastroenterology     Other   Nausea    This is becoming chronic  Reassuring labs last time  Some imp with dec coffee intake but then symptoms returned  Disc diet changes Ref to GI       Relevant Orders   Ambulatory referral to Gastroenterology   Lower abdominal pain - Primary    New low abd pain -intermittent and mild Pt has never had a colonoscopy  Re assuring exam but abd palpation caused soreness afterwards  Enc better fiber intake Disc poss of diverticuli-will scale back on nuts and seeds Ref to GI for this and nausea      Relevant Orders   Ambulatory referral to Gastroenterology

## 2018-08-18 NOTE — Assessment & Plan Note (Signed)
Urged to reduce caffeine and etoh  Continue protonix Rev prior labs  Ref to GI (? If gerd is causing chronic nausea)

## 2018-08-18 NOTE — Assessment & Plan Note (Signed)
New low abd pain -intermittent and mild Pt has never had a colonoscopy  Re assuring exam but abd palpation caused soreness afterwards  Enc better fiber intake Disc poss of diverticuli-will scale back on nuts and seeds Ref to GI for this and nausea

## 2018-08-18 NOTE — Patient Instructions (Addendum)
We will refer you to GI (our office will call you)   Get Citrucel over the counter - take one serving per day   Keep a food diary re: when symptoms are worse  Perhaps dairy or another food is bothering you   Keep weaning coffee  Also cut alcohol and see if this helps

## 2018-08-18 NOTE — Assessment & Plan Note (Signed)
This is becoming chronic  Reassuring labs last time  Some imp with dec coffee intake but then symptoms returned  Disc diet changes Ref to GI

## 2018-08-24 ENCOUNTER — Encounter: Payer: Self-pay | Admitting: Nurse Practitioner

## 2018-09-02 ENCOUNTER — Encounter (INDEPENDENT_AMBULATORY_CARE_PROVIDER_SITE_OTHER): Payer: Self-pay

## 2018-09-02 ENCOUNTER — Encounter: Payer: Self-pay | Admitting: Nurse Practitioner

## 2018-09-02 ENCOUNTER — Ambulatory Visit: Payer: BLUE CROSS/BLUE SHIELD | Admitting: Nurse Practitioner

## 2018-09-02 VITALS — BP 120/70 | HR 80 | Ht 67.0 in | Wt 179.8 lb

## 2018-09-02 DIAGNOSIS — K219 Gastro-esophageal reflux disease without esophagitis: Secondary | ICD-10-CM | POA: Diagnosis not present

## 2018-09-02 DIAGNOSIS — Z1211 Encounter for screening for malignant neoplasm of colon: Secondary | ICD-10-CM | POA: Diagnosis not present

## 2018-09-02 DIAGNOSIS — E538 Deficiency of other specified B group vitamins: Secondary | ICD-10-CM | POA: Diagnosis not present

## 2018-09-02 NOTE — Progress Notes (Signed)
ASSESSMENT / PLAN:   56.  68 year old male with recent nausea, chest discomfort and left lower quadrant discomfort.  All of his symptoms resolved weeks ago after making dietary changes which included reduction of caffeine consumption and avoidance of peanuts.  Also the chest discomfort may have been related to a certain exercise patient was performing which she has since stopped.  2 GERD, asymptomatic on PPI for years  3.  Colon cancer screening.  Patient has never had colonoscopy or other colon cancer screening.  Patient diligently works to take care of himself.  He does not understand his own reluctance to have a colonoscopy.  We did discuss Cologuard as an option but he understands that a positive study would still require a colonoscopy.   I think his concerns were addressed today and he would like to proceed with a colonoscopy. -Patient's wife Wells Guiles sees Dr. Carlean Purl.  Patient requesting Dr. Carlean Purl to be his Gastroenterologist as well. The risks and benefits of colonoscopy with possible polypectomy were discussed and the patient agrees to proceed.   HPI:    Chief Complaint:   Abdominal pain Patient is a 68 year old relatively healthy male with history of ADD . He is new to the practice, referred by PCP Dr. Glori Bickers for evaluation of abdominal pain.  Patient tells me that his abdominal pain has actually resolved after making dietary changes.  His problem started in December when he presented to PCP with nausea and chest discomfort.  Patient has a 10-year history of GERD and was already on a PPI.  He reduced caffeine consumption and discontinued a certain exercise he had been doing and chest discomfort as well as nausea resolved.  Around the same time he recalls having some left lower quadrant discomfort.  PCP advised him to eating peanuts in the left lower quadrant discomfort resolved.  Patient actually has no GI or general medical complaints.  His bowel movements are normal, no  blood in stool.  Weight is stable.  Patient has never had a colonoscopy nor Cologuard.  Patient is very diligent to take care of his health and does not understand on reluctance to have a colonoscopy.  He did express interest in talking about the benefits of colonoscopy today in clinic.  Data Reviewed:   Labs 07/09/2018 CMET and CBC normal   Past Medical History:  Diagnosis Date  . ADD (attention deficit disorder)   . Allergy    allergic rhinitis  . Anxiety   . Bursitis of hip, right   . Depression   . History of meniscal tear    left knee meniscal tear  . Hyperlipidemia      History reviewed. No pertinent surgical history. Family History  Problem Relation Age of Onset  . Heart disease Father        CAD  . Hypertension Mother   . Depression Mother   . Heart disease Paternal Uncle        CAD  . COPD Maternal Grandfather    Social History   Tobacco Use  . Smoking status: Former Research scientist (life sciences)  . Smokeless tobacco: Never Used  . Tobacco comment: Smoked for about one year 30 years ago  Substance Use Topics  . Alcohol use: Yes    Alcohol/week: 0.0 standard drinks    Comment: occ  . Drug use: No   Current Outpatient Medications  Medication Sig Dispense Refill  . methylphenidate (METADATE ER)  20 MG ER tablet Take 1 tablet (20 mg total) by mouth daily. 30 tablet 0  . pantoprazole (PROTONIX) 40 MG tablet Take 1 tablet (40 mg total) by mouth daily. 90 tablet 3  . rosuvastatin (CRESTOR) 40 MG tablet Take 1 tablet (40 mg total) by mouth daily. 90 tablet 3  . sildenafil (REVATIO) 20 MG tablet TAKE 3 TABLETS BY MOUTH AS NEEDED FOR 30 MINUTES BEFORE SEXUAL ACTIVITY 30 tablet 5  . traMADol (ULTRAM) 50 MG tablet Take 1 tablet (50 mg total) by mouth 2 (two) times daily as needed. 30 tablet 0   No current facility-administered medications for this visit.    Allergies  Allergen Reactions  . Aleve [Naproxen Sodium] Swelling    Face and lips swollen and hives  . Omeprazole      REACTION: not effective     Review of Systems: All systems reviewed and negative except where noted in HPI.   Creatinine clearance cannot be calculated (Patient's most recent lab result is older than the maximum 21 days allowed.)   Physical Exam:    Wt Readings from Last 3 Encounters:  09/02/18 179 lb 12.8 oz (81.6 kg)  07/09/18 181 lb (82.1 kg)  05/01/18 178 lb 8 oz (81 kg)    BP 120/70   Pulse 80   Ht 5\' 7"  (1.702 m)   Wt 179 lb 12.8 oz (81.6 kg)   SpO2 97%   BMI 28.16 kg/m  Constitutional:  Pleasant male in no acute distress. Psychiatric: Normal mood and affect. Behavior is normal. EENT: Pupils normal.  Conjunctivae are normal. No scleral icterus. Neck supple.  Cardiovascular: Normal rate, regular rhythm. No edema Pulmonary/chest: Effort normal and breath sounds normal. No wheezing, rales or rhonchi. Abdominal: Soft, nondistended, nontender. Bowel sounds active throughout. There are no masses palpable. No hepatomegaly. Neurological: Alert and oriented to person place and time. Skin: Skin is warm and dry. No rashes noted.  Tye Savoy, NP  09/02/2018, 9:53 AM  Cc: Tower, Wynelle Fanny, MD

## 2018-09-02 NOTE — Patient Instructions (Signed)
If you are age 70 or older, your body mass index should be between 23-30. Your Body mass index is 28.16 kg/m. If this is out of the aforementioned range listed, please consider follow up with your Primary Care Provider.  If you are age 51 or younger, your body mass index should be between 19-25. Your Body mass index is 28.16 kg/m. If this is out of the aformentioned range listed, please consider follow up with your Primary Care Provider.   You have been scheduled for a colonoscopy. Please follow written instructions given to you at your visit today.  Please pick up your prep supplies at the pharmacy within the next 1-3 days. If you use inhalers (even only as needed), please bring them with you on the day of your procedure. Your physician has requested that you go to www.startemmi.com and enter the access code given to you at your visit today. This web site gives a general overview about your procedure. However, you should still follow specific instructions given to you by our office regarding your preparation for the procedure.  Thank you for choosing me and [facility].   Tye Savoy, NP

## 2018-09-04 ENCOUNTER — Encounter: Payer: BLUE CROSS/BLUE SHIELD | Admitting: Internal Medicine

## 2018-09-24 ENCOUNTER — Telehealth: Payer: Self-pay | Admitting: Internal Medicine

## 2018-09-24 NOTE — Telephone Encounter (Signed)
Pt had some questions about his upcoming procedure.

## 2018-09-24 NOTE — Telephone Encounter (Signed)
Pt called and we discussed ALL prep instructions that were given at an OV on 09-02-2018. Pt saw Nevin Bloodgood and he had many many questions about his prep instructions- we discussed start to finish prep instructions , meds, prep mixing and taking, what to wear, time to arrive, length of time here and insurance today.  Encouraged pt to call with further questions   Lelan Pons pV

## 2018-09-30 ENCOUNTER — Telehealth: Payer: Self-pay | Admitting: *Deleted

## 2018-09-30 ENCOUNTER — Telehealth: Payer: Self-pay | Admitting: Internal Medicine

## 2018-09-30 NOTE — Telephone Encounter (Signed)
Spoke with Public librarian he had stepped out of the office. Will attempt to call him back. SM

## 2018-09-30 NOTE — Telephone Encounter (Signed)
PT called back and left his cell phone and voicemail. Wasn't sure if patient had another question. Left message for patient to call back. SM

## 2018-09-30 NOTE — Telephone Encounter (Signed)
PT called back and left phone number [phone] to be reached on he advised that a msg can be left on VM

## 2018-09-30 NOTE — Telephone Encounter (Signed)
Called patient back he was concerned about sitting in the lobby and being at risk for the "virus". I assured him that we were taking all the neccessary precautions and were health screening every patient that comes in the office and that we could offer him or his wife a mask for protection while they were here. PT verbalized understanding and will be here tomorrow for his procedure. SM

## 2018-10-01 ENCOUNTER — Other Ambulatory Visit: Payer: Self-pay

## 2018-10-01 ENCOUNTER — Ambulatory Visit (AMBULATORY_SURGERY_CENTER): Payer: BLUE CROSS/BLUE SHIELD | Admitting: Internal Medicine

## 2018-10-01 ENCOUNTER — Encounter: Payer: Self-pay | Admitting: Internal Medicine

## 2018-10-01 VITALS — BP 130/92 | HR 64 | Temp 98.6°F | Resp 15 | Ht 68.0 in | Wt 178.0 lb

## 2018-10-01 DIAGNOSIS — D124 Benign neoplasm of descending colon: Secondary | ICD-10-CM | POA: Diagnosis not present

## 2018-10-01 DIAGNOSIS — D123 Benign neoplasm of transverse colon: Secondary | ICD-10-CM

## 2018-10-01 DIAGNOSIS — D120 Benign neoplasm of cecum: Secondary | ICD-10-CM | POA: Diagnosis not present

## 2018-10-01 DIAGNOSIS — D125 Benign neoplasm of sigmoid colon: Secondary | ICD-10-CM | POA: Diagnosis not present

## 2018-10-01 DIAGNOSIS — K635 Polyp of colon: Secondary | ICD-10-CM

## 2018-10-01 DIAGNOSIS — Z1211 Encounter for screening for malignant neoplasm of colon: Secondary | ICD-10-CM

## 2018-10-01 DIAGNOSIS — D122 Benign neoplasm of ascending colon: Secondary | ICD-10-CM

## 2018-10-01 MED ORDER — SODIUM CHLORIDE 0.9 % IV SOLN: 500.0000 mL | Freq: Once | INTRAVENOUS | Status: DC

## 2018-10-01 NOTE — Patient Instructions (Addendum)
I found and removed 5 tiny polyps.  I will let you know pathology results and when to have another routine colonoscopy by mail and/or My Chart.  You also have a condition called diverticulosis - common and not usually a problem. Please read the handout provided.  I appreciate the opportunity to care for you. Gatha Mayer, MD, [facility]   Handouts Provided:  Polyps and Diverticulosis  YOU HAD AN ENDOSCOPIC PROCEDURE TODAY AT [facility]:   Refer to the procedure report that was given to you for any specific questions about what was found during the examination.  If the procedure report does not answer your questions, please call your gastroenterologist to clarify.  If you requested that your care partner not be given the details of your procedure findings, then the procedure report has been included in a sealed envelope for you to review at your convenience later.  YOU SHOULD EXPECT: Some feelings of bloating in the abdomen. Passage of more gas than usual.  Walking can help get rid of the air that was put into your GI tract during the procedure and reduce the bloating. If you had a lower endoscopy (such as a colonoscopy or flexible sigmoidoscopy) you may notice spotting of blood in your stool or on the toilet paper. If you underwent a bowel prep for your procedure, you may not have a normal bowel movement for a few days.  Please Note:  You might notice some irritation and congestion in your nose or some drainage.  This is from the oxygen used during your procedure.  There is no need for concern and it should clear up in a day or so.  SYMPTOMS TO REPORT IMMEDIATELY:   Following lower endoscopy (colonoscopy or flexible sigmoidoscopy):  Excessive amounts of blood in the stool  Significant tenderness or worsening of abdominal pains  Swelling of the abdomen that is new, acute  Fever of 100F or higher  For urgent or emergent issues, a gastroenterologist can be reached at  any hour by calling [phone].   DIET:  We do recommend a small meal at first, but then you may proceed to your regular diet.  Drink plenty of fluids but you should avoid alcoholic beverages for 24 hours.  ACTIVITY:  You should plan to take it easy for the rest of today and you should NOT DRIVE or use heavy machinery until tomorrow (because of the sedation medicines used during the test).    FOLLOW UP: Our staff will call the number listed on your records the next business day following your procedure to check on you and address any questions or concerns that you may have regarding the information given to you following your procedure. If we do not reach you, we will leave a message.  However, if you are feeling well and you are not experiencing any problems, there is no need to return our call.  We will assume that you have returned to your regular daily activities without incident.  If any biopsies were taken you will be contacted by phone or by letter within the next 1-3 weeks.  Please call us at [phone] if you have not heard about the biopsies in 3 weeks.    SIGNATURES/CONFIDENTIALITY: You and/or your care partner have signed paperwork which will be entered into your electronic medical record.  These signatures attest to the fact that that the information above on your After Visit Summary has been reviewed and is understood.  Full  responsibility of the confidentiality of this discharge information lies with you and/or your care-partner.

## 2018-10-01 NOTE — Op Note (Signed)
[place] Patient Name: Benjamin Bullock Procedure Date: 10/01/2018 11:26 AM MRN: [id number] Endoscopist: Gatha Mayer , MD Age: 68 Referring MD:  Date of Birth: [birth year] Gender: Male Account #: [id number] Procedure:                Colonoscopy Indications:              Screening for colorectal malignant neoplasm, This                            is the patient's first colonoscopy Medicines:                Propofol per Anesthesia, Monitored Anesthesia Care Procedure:                Pre-Anesthesia Assessment:                           - Prior to the procedure, a History and Physical                            was performed, and patient medications and                            allergies were reviewed. The patient's tolerance of                            previous anesthesia was also reviewed. The risks                            and benefits of the procedure and the sedation                            options and risks were discussed with the patient.                            All questions were answered, and informed consent                            was obtained. Prior Anticoagulants: The patient has                            taken no previous anticoagulant or antiplatelet                            agents. ASA Grade Assessment: II - A patient with                            mild systemic disease. After reviewing the risks                            and benefits, the patient was deemed in                            satisfactory condition to undergo the procedure.  After obtaining informed consent, the colonoscope                            was passed under direct vision. Throughout the                            procedure, the patient's blood pressure, pulse, and                            oxygen saturations were monitored continuously. The                            Colonoscope was introduced through the anus and   advanced to the the cecum, identified by                            appendiceal orifice and ileocecal valve. The                            colonoscopy was performed without difficulty. The                            patient tolerated the procedure well. The quality                            of the bowel preparation was good. The bowel                            preparation used was Miralax. The ileocecal valve,                            appendiceal orifice, and rectum were photographed. Scope In: 11:38:36 AM Scope Out: 11:58:49 AM Scope Withdrawal Time: 0 hours 17 minutes 24 seconds  Total Procedure Duration: 0 hours 20 minutes 13 seconds  Findings:                 Five sessile polyps were found in the sigmoid                            colon, descending colon, transverse colon,                            ascending colon and cecum. The polyps were                            diminutive in size. These polyps were removed with                            a cold snare. Resection and retrieval were                            complete. Verification of patient identification                            for  the specimen was done. Estimated blood loss was                            minimal.                           Multiple diverticula were found in the sigmoid                            colon.                           The exam was otherwise without abnormality on                            direct and retroflexion views. Complications:            No immediate complications. Estimated Blood Loss:     Estimated blood loss was minimal. Impression:               - Five diminutive polyps in the sigmoid colon, in                            the descending colon, in the transverse colon, in                            the ascending colon and in the cecum, removed with                            a cold snare. Resected and retrieved.                           - Diverticulosis in the sigmoid colon.                            - The examination was otherwise normal on direct                            and retroflexion views. Recommendation:           - Patient has a contact number available for                            emergencies. The signs and symptoms of potential                            delayed complications were discussed with the                            patient. Return to normal activities tomorrow.                            Written discharge instructions were provided to the                            patient.                           -  Resume previous diet.                           - Continue present medications.                           - Repeat colonoscopy is recommended. The                            colonoscopy date will be determined after pathology                            results from today's exam become available for                            review. Gatha Mayer, MD 10/01/2018 12:06:49 PM This report has been signed electronically.

## 2018-10-01 NOTE — Progress Notes (Signed)
A and O x3. Report to RN. Tolerated MAC anesthesia well.

## 2018-10-01 NOTE — Progress Notes (Signed)
Called to room to assist during endoscopic procedure.  Patient ID and intended procedure confirmed with present staff. Received instructions for my participation in the procedure from the performing physician.  

## 2018-10-02 ENCOUNTER — Telehealth: Payer: Self-pay | Admitting: *Deleted

## 2018-10-02 ENCOUNTER — Telehealth: Payer: Self-pay

## 2018-10-02 NOTE — Telephone Encounter (Signed)
Left message on f/u call 

## 2018-10-02 NOTE — Telephone Encounter (Signed)
Pt called back to and advised that he is doing well.

## 2018-10-02 NOTE — Telephone Encounter (Signed)
Second follow up call attempt.  Message left to call with any questions or concerns.

## 2018-10-05 ENCOUNTER — Encounter: Payer: Self-pay | Admitting: Internal Medicine

## 2018-10-05 NOTE — Progress Notes (Signed)
5 polyps - mix of ssp's and adenomas Recall 2023

## 2019-01-27 ENCOUNTER — Other Ambulatory Visit: Payer: Self-pay | Admitting: Family Medicine

## 2019-01-27 MED ORDER — METHYLPHENIDATE HCL ER 20 MG PO TBCR: 20.0000 mg | EXTENDED_RELEASE_TABLET | Freq: Every day | ORAL | 0 refills | Status: DC

## 2019-01-27 NOTE — Telephone Encounter (Signed)
Best number [phone] Pt called to get a refill on methylphenidate [facility]  [facility] to get [facility]ill

## 2019-01-27 NOTE — Telephone Encounter (Signed)
See prev note. Last filled on 05/01/18 #30 tabs with 0 refills, last OV was an acute appt for GI issues on 08/18/18, CPE scheduled for 05/07/19

## 2019-02-15 ENCOUNTER — Telehealth: Payer: Self-pay | Admitting: *Deleted

## 2019-02-15 MED ORDER — PANTOPRAZOLE SODIUM 40 MG PO TBEC: 40.0000 mg | DELAYED_RELEASE_TABLET | Freq: Two times a day (BID) | ORAL | 1 refills | Status: DC

## 2019-02-15 NOTE — Telephone Encounter (Signed)
I sent it  F/u if no improvement

## 2019-02-15 NOTE — Telephone Encounter (Signed)
Patient called stating that he has been working from home for the last 4 months and it has been more stressful. Patient stated that he has been having to take 1 and 1/2 Pantoprazole instead of one that was prescribed. Patient stated that he is going to run out of his medication the end of next week because he has been taking extra. Patient wants to know if a new script can be sent to the pharmacy with the way that he has been taking it so that he can get it filled before he runs out? [facility]

## 2019-02-15 NOTE — Telephone Encounter (Signed)
Pt notified Rx sent and advised of Dr. Marliss Coots recommendations

## 2019-03-01 ENCOUNTER — Other Ambulatory Visit: Payer: Self-pay | Admitting: *Deleted

## 2019-03-01 NOTE — Telephone Encounter (Signed)
Patient left a voicemail stating that he is missing a bottle of his Crestor and not sure if he misplaced it or not, but unable to find it. Patient stated that he only has a few pills left and was [facility] that [facility] can not [facility] yet. Patient is requesting a call back about trying to get another script for Crestor.

## 2019-03-01 NOTE — Telephone Encounter (Signed)
Patient left another voicemail stating to disregard the previous message because his pharmacy has refilled his medication.

## 2019-03-02 ENCOUNTER — Other Ambulatory Visit: Payer: Self-pay | Admitting: *Deleted

## 2019-03-02 MED ORDER — SILDENAFIL CITRATE 20 MG PO TABS: ORAL_TABLET | ORAL | 5 refills | Status: DC

## 2019-03-16 DIAGNOSIS — M5030 Other cervical disc degeneration, unspecified cervical region: Secondary | ICD-10-CM | POA: Diagnosis not present

## 2019-04-21 DIAGNOSIS — L814 Other melanin hyperpigmentation: Secondary | ICD-10-CM | POA: Diagnosis not present

## 2019-04-21 DIAGNOSIS — L821 Other seborrheic keratosis: Secondary | ICD-10-CM | POA: Diagnosis not present

## 2019-04-21 DIAGNOSIS — Z85828 Personal history of other malignant neoplasm of skin: Secondary | ICD-10-CM | POA: Diagnosis not present

## 2019-04-21 DIAGNOSIS — C4441 Basal cell carcinoma of skin of scalp and neck: Secondary | ICD-10-CM | POA: Diagnosis not present

## 2019-04-21 DIAGNOSIS — L570 Actinic keratosis: Secondary | ICD-10-CM | POA: Diagnosis not present

## 2019-05-02 ENCOUNTER — Telehealth: Payer: Self-pay | Admitting: Family Medicine

## 2019-05-02 DIAGNOSIS — Z125 Encounter for screening for malignant neoplasm of prostate: Secondary | ICD-10-CM

## 2019-05-02 DIAGNOSIS — E538 Deficiency of other specified B group vitamins: Secondary | ICD-10-CM

## 2019-05-02 DIAGNOSIS — R7303 Prediabetes: Secondary | ICD-10-CM

## 2019-05-02 DIAGNOSIS — Z0000 Encounter for general adult medical examination without abnormal findings: Secondary | ICD-10-CM

## 2019-05-02 DIAGNOSIS — E7800 Pure hypercholesterolemia, unspecified: Secondary | ICD-10-CM

## 2019-05-02 NOTE — Telephone Encounter (Signed)
-----   Message from Ellamae Sia sent at 04/26/2019  2:16 PM EDT ----- Regarding: Lab orders for Tuesday, 10.13.20 Patient is scheduled for CPX labs, please order future labs, Thanks , Karna Christmas

## 2019-05-03 ENCOUNTER — Other Ambulatory Visit: Payer: BLUE CROSS/BLUE SHIELD

## 2019-05-04 ENCOUNTER — Other Ambulatory Visit (INDEPENDENT_AMBULATORY_CARE_PROVIDER_SITE_OTHER): Payer: BC Managed Care – PPO

## 2019-05-04 ENCOUNTER — Ambulatory Visit (INDEPENDENT_AMBULATORY_CARE_PROVIDER_SITE_OTHER): Payer: BC Managed Care – PPO

## 2019-05-04 ENCOUNTER — Other Ambulatory Visit: Payer: Self-pay

## 2019-05-04 DIAGNOSIS — Z125 Encounter for screening for malignant neoplasm of prostate: Secondary | ICD-10-CM

## 2019-05-04 DIAGNOSIS — E7800 Pure hypercholesterolemia, unspecified: Secondary | ICD-10-CM

## 2019-05-04 DIAGNOSIS — Z23 Encounter for immunization: Secondary | ICD-10-CM

## 2019-05-04 DIAGNOSIS — R7303 Prediabetes: Secondary | ICD-10-CM

## 2019-05-04 DIAGNOSIS — Z0000 Encounter for general adult medical examination without abnormal findings: Secondary | ICD-10-CM

## 2019-05-04 DIAGNOSIS — E538 Deficiency of other specified B group vitamins: Secondary | ICD-10-CM | POA: Diagnosis not present

## 2019-05-04 LAB — TSH: TSH: 2.56 u[IU]/mL (ref 0.35–4.50)

## 2019-05-04 LAB — COMPREHENSIVE METABOLIC PANEL
ALT: 23 U/L (ref 0–53)
AST: 22 U/L (ref 0–37)
Albumin: 4.6 g/dL (ref 3.5–5.2)
Alkaline Phosphatase: 50 U/L (ref 39–117)
BUN: 14 mg/dL (ref 6–23)
CO2: 28 mEq/L (ref 19–32)
Calcium: 9.4 mg/dL (ref 8.4–10.5)
Chloride: 103 mEq/L (ref 96–112)
Creatinine, Ser: 0.87 mg/dL (ref 0.40–1.50)
GFR: 86.57 mL/min (ref >60.00)
Glucose, Bld: 111 mg/dL — ABNORMAL HIGH (ref 70–99)
Potassium: 4.3 mEq/L (ref 3.5–5.1)
Sodium: 139 mEq/L (ref 135–145)
Total Bilirubin: 0.8 mg/dL (ref 0.2–1.2)
Total Protein: 6.7 g/dL (ref 6.0–8.3)

## 2019-05-04 LAB — CBC WITH DIFFERENTIAL/PLATELET
Basophils Absolute: 0 10*3/uL (ref 0.0–0.1)
Basophils Relative: 1.2 % (ref 0.0–3.0)
Eosinophils Absolute: 0 10*3/uL (ref 0.0–0.7)
Eosinophils Relative: 1.1 % (ref 0.0–5.0)
HCT: 45.4 % (ref 39.0–52.0)
Hemoglobin: 15.3 g/dL (ref 13.0–17.0)
Lymphocytes Relative: 42.4 % (ref 12.0–46.0)
Lymphs Abs: 1.7 10*3/uL (ref 0.7–4.0)
MCHC: 33.7 g/dL (ref 30.0–36.0)
MCV: 91 fl (ref 78.0–100.0)
Monocytes Absolute: 0.4 10*3/uL (ref 0.1–1.0)
Monocytes Relative: 9.6 % (ref 3.0–12.0)
Neutro Abs: 1.9 10*3/uL (ref 1.4–7.7)
Neutrophils Relative %: 45.7 % (ref 43.0–77.0)
Platelets: 182 10*3/uL (ref 150.0–400.0)
RBC: 4.99 Mil/uL (ref 4.22–5.81)
RDW: 13.9 % (ref 11.5–15.5)
WBC: 4.1 10*3/uL (ref 4.0–10.5)

## 2019-05-04 LAB — LIPID PANEL
Cholesterol: 189 mg/dL (ref 0–200)
HDL: 57 mg/dL (ref >39.00)
LDL Cholesterol: 109 mg/dL — ABNORMAL HIGH (ref 0–99)
NonHDL: 131.92
Total CHOL/HDL Ratio: 3
Triglycerides: 117 mg/dL (ref 0.0–149.0)
VLDL: 23.4 mg/dL (ref 0.0–40.0)

## 2019-05-04 LAB — HEMOGLOBIN A1C: Hgb A1c MFr Bld: 6.5 % (ref 4.6–6.5)

## 2019-05-04 LAB — VITAMIN B12: Vitamin B-12: 390 pg/mL (ref 211–911)

## 2019-05-04 LAB — PSA: PSA: 2.05 ng/mL (ref 0.10–4.00)

## 2019-05-07 ENCOUNTER — Other Ambulatory Visit: Payer: Self-pay

## 2019-05-07 ENCOUNTER — Ambulatory Visit (INDEPENDENT_AMBULATORY_CARE_PROVIDER_SITE_OTHER): Payer: BC Managed Care – PPO | Admitting: Family Medicine

## 2019-05-07 ENCOUNTER — Encounter: Payer: Self-pay | Admitting: Family Medicine

## 2019-05-07 VITALS — BP 137/80 | HR 62 | Temp 97.4°F | Ht 67.0 in | Wt 178.0 lb

## 2019-05-07 DIAGNOSIS — R7303 Prediabetes: Secondary | ICD-10-CM | POA: Diagnosis not present

## 2019-05-07 DIAGNOSIS — E538 Deficiency of other specified B group vitamins: Secondary | ICD-10-CM

## 2019-05-07 DIAGNOSIS — Z79899 Other long term (current) drug therapy: Secondary | ICD-10-CM

## 2019-05-07 DIAGNOSIS — F988 Other specified behavioral and emotional disorders with onset usually occurring in childhood and adolescence: Secondary | ICD-10-CM

## 2019-05-07 DIAGNOSIS — Z0000 Encounter for general adult medical examination without abnormal findings: Secondary | ICD-10-CM

## 2019-05-07 DIAGNOSIS — Z125 Encounter for screening for malignant neoplasm of prostate: Secondary | ICD-10-CM

## 2019-05-07 DIAGNOSIS — F418 Other specified anxiety disorders: Secondary | ICD-10-CM

## 2019-05-07 DIAGNOSIS — E7800 Pure hypercholesterolemia, unspecified: Secondary | ICD-10-CM | POA: Diagnosis not present

## 2019-05-07 NOTE — Assessment & Plan Note (Signed)
GERD has improved lately  Using pantoprazole  Enc to watch diet

## 2019-05-07 NOTE — Progress Notes (Signed)
Subjective:    Patient ID: Benjamin Bullock, male    DOB: 09-Feb-1951, 68 y.o.   MRN: [id number]  HPI Here for health maintenance exam and to review chronic medical problems    Working from home now  Makes him a little depressed    Wt Readings from Last 3 Encounters:  05/07/19 178 lb (80.7 kg)  10/01/18 178 lb (80.7 kg)  09/02/18 179 lb 12.8 oz (81.6 kg)  did loose some weight  Walking regularly at least 4-5 days per week  27.88 kg/m   Tdap 9/12 Flu shot 05/04/19  Zoster status 11/14 zostavax  Is interested in shingrix   Colonoscopy 3/20 with 3 y recall = polyps    Prostate health Nocturia times 2  Drinks lots of water  No problems emptying  Only urinates frequently if he drinks lots of water Stream is good  Has cut coffee to 2 cups per day  Lab Results  Component Value Date   PSA 2.05 05/04/2019   PSA 1.53 04/28/2018   PSA 1.77 04/14/2017     PNA vaccines completed   BP Readings from Last 3 Encounters:  05/07/19 (!) 142/78  10/01/18 (!) 130/92  09/02/18 120/70  anxious today  BP: 137/80  -better on 2nd check after sitting   Pulse Readings from Last 3 Encounters:  05/07/19 62  10/01/18 64  09/02/18 80    Prediabetes Lab Results  Component Value Date   HGBA1C 6.5 05/04/2019  diet -more carbs  Tries to eat healthy  2 1/2 glasses of wine per day  More potato chips   Hyperlipidemia Lab Results  Component Value Date   CHOL 189 05/04/2019   CHOL 168 04/28/2018   CHOL 186 04/14/2017   Lab Results  Component Value Date   HDL 57.00 05/04/2019   HDL 51.70 04/28/2018   HDL 52.20 04/14/2017   Lab Results  Component Value Date   LDLCALC 109 (H) 05/04/2019   LDLCALC 97 04/28/2018   LDLCALC 116 (H) 04/14/2017   Lab Results  Component Value Date   TRIG 117.0 05/04/2019   TRIG 99.0 04/28/2018   TRIG 89.0 04/14/2017   Lab Results  Component Value Date   CHOLHDL 3 05/04/2019   CHOLHDL 3 04/28/2018   CHOLHDL 4 04/14/2017   Lab Results   Component Value Date   LDLDIRECT 130.9 10/25/2008   LDLDIRECT 141.4 03/16/2007   LDLDIRECT 139.6 09/18/2006  LDL is up  crestor 40 mg   B12 def Lab Results  Component Value Date   VITAMINB12 390 05/04/2019   Gerd on ppi- doing better lately  Depends on stress level   Had some back problems Takes baclofen at night from orthopedics  ADD-ritalin works well  Does not take every day   Patient Active Problem List   Diagnosis Date Noted  . Lower abdominal pain 08/18/2018  . Nausea 07/09/2018  . Prediabetes 05/22/2017  . B12 deficiency 04/22/2017  . Current use of proton pump inhibitor 04/15/2016  . Anxiety 08/20/2013  . Allergic drug reaction 01/29/2012  . Back pain 02/05/2011  . Routine general medical examination at a health care facility 11/08/2010  . Prostate cancer screening 11/08/2010  . GERD 09/20/2009  . ANXIETY STATE NOS 01/08/2007  . Attention deficit disorder 01/08/2007  . Hyperlipidemia 01/07/2007  . ERECTILE DYSFUNCTION 01/07/2007  . Depression with anxiety 01/07/2007  . Allergic rhinitis 01/07/2007   Past Medical History:  Diagnosis Date  . ADD (attention deficit disorder)   . Allergy  allergic rhinitis  . Anxiety   . Bursitis of hip, right   . Depression   . GERD (gastroesophageal reflux disease)   . History of meniscal tear    left knee meniscal tear  . Hyperlipidemia    History reviewed. No pertinent surgical history. Social History   Tobacco Use  . Smoking status: Former Smoker    Quit date: 1978    Years since quitting: 42.8  . Smokeless tobacco: Never Used  . Tobacco comment: Smoked for about one year 30 years ago  Substance Use Topics  . Alcohol use: Yes    Alcohol/week: 0.0 standard drinks    Comment: occ  . Drug use: Never   Family History  Problem Relation Age of Onset  . Heart disease Father        CAD  . Hypertension Mother   . Depression Mother   . Heart disease Paternal Uncle        CAD  . COPD Maternal Grandfather    . Colon cancer Neg Hx   . Colon polyps Neg Hx   . Rectal cancer Neg Hx   . Stomach cancer Neg Hx   . Esophageal cancer Neg Hx    Allergies  Allergen Reactions  . Aleve [Naproxen Sodium] Swelling    Face and lips swollen and hives  . Omeprazole     REACTION: not effective   Current Outpatient Medications on File Prior to Visit  Medication Sig Dispense Refill  . baclofen (LIORESAL) 10 MG tablet TAKE 1 TABLET BY MOUTH TWICE A DAY AS NEEDED CAN MAKE YOU DROWSY    . methylphenidate (METADATE ER) 20 MG ER tablet Take 1 tablet (20 mg total) by mouth daily. (Patient taking differently: Take 20 mg by mouth daily as needed. ) 30 tablet 0  . pantoprazole (PROTONIX) 40 MG tablet Take 1 tablet (40 mg total) by mouth 2 (two) times daily. 180 tablet 1  . rosuvastatin (CRESTOR) 40 MG tablet Take 1 tablet (40 mg total) by mouth daily. 90 tablet 3  . sildenafil (REVATIO) 20 MG tablet TAKE 3 TABLETS BY MOUTH AS NEEDED FOR 30 MINUTES BEFORE SEXUAL ACTIVITY 30 tablet 5  . traMADol (ULTRAM) 50 MG tablet Take 1 tablet (50 mg total) by mouth 2 (two) times daily as needed. 30 tablet 0   No current facility-administered medications on file prior to visit.     Review of Systems  Constitutional: Negative for activity change, appetite change, fatigue, fever and unexpected weight change.  HENT: Negative for congestion, rhinorrhea, sore throat and trouble swallowing.   Eyes: Negative for pain, redness, itching and visual disturbance.  Respiratory: Negative for cough, chest tightness, shortness of breath and wheezing.   Cardiovascular: Negative for chest pain and palpitations.  Gastrointestinal: Negative for abdominal pain, blood in stool, constipation, diarrhea and nausea.  Endocrine: Negative for cold intolerance, heat intolerance, polydipsia and polyuria.  Genitourinary: Negative for difficulty urinating, dysuria, frequency and urgency.  Musculoskeletal: Negative for arthralgias, joint swelling and myalgias.   Skin: Negative for pallor and rash.  Neurological: Negative for dizziness, tremors, weakness, numbness and headaches.  Hematological: Negative for adenopathy. Does not bruise/bleed easily.  Psychiatric/Behavioral: Positive for dysphoric mood. Negative for decreased concentration and suicidal ideas. The patient is nervous/anxious.        Objective:   Physical Exam Constitutional:      General: He is not in acute distress.    Appearance: Normal appearance. He is well-developed and normal weight. He is not ill-appearing  or diaphoretic.  HENT:     Head: Normocephalic and atraumatic.     Right Ear: Tympanic membrane, ear canal and external ear normal.     Left Ear: Tympanic membrane, ear canal and external ear normal.     Nose: Nose normal. No congestion.     Mouth/Throat:     Mouth: Mucous membranes are moist.     Pharynx: Oropharynx is clear. No posterior oropharyngeal erythema.  Eyes:     General: No scleral icterus.       Right eye: No discharge.        Left eye: No discharge.     Conjunctiva/sclera: Conjunctivae normal.     Pupils: Pupils are equal, round, and reactive to light.  Neck:     Musculoskeletal: Normal range of motion and neck supple. No neck rigidity or muscular tenderness.     Thyroid: No thyromegaly.     Vascular: No carotid bruit or JVD.  Cardiovascular:     Rate and Rhythm: Normal rate and regular rhythm.     Pulses: Normal pulses.     Heart sounds: Normal heart sounds. No gallop.   Pulmonary:     Effort: Pulmonary effort is normal. No respiratory distress.     Breath sounds: Normal breath sounds. No wheezing or rales.     Comments: Good air exch Chest:     Chest wall: No tenderness.  Abdominal:     General: Bowel sounds are normal. There is no distension or abdominal bruit.     Palpations: Abdomen is soft. There is no mass.     Tenderness: There is no abdominal tenderness.     Hernia: No hernia is present.  Musculoskeletal:        General: No  tenderness.     Right lower leg: No edema.     Left lower leg: No edema.  Lymphadenopathy:     Cervical: No cervical adenopathy.  Skin:    General: Skin is warm and dry.     Coloration: Skin is not pale.     Findings: No erythema or rash.     Comments: Fair  Solar lentigines diffusely   Neurological:     Mental Status: He is alert.     Cranial Nerves: No cranial nerve deficit.     Motor: No abnormal muscle tone.     Coordination: Coordination normal.     Gait: Gait normal.     Deep Tendon Reflexes: Reflexes are normal and symmetric. Reflexes normal.  Psychiatric:        Mood and Affect: Mood normal.        Speech: Speech normal.        Cognition and Memory: Cognition and memory normal.     Comments: Attentive today  Less tangential   Candidly discusses stressors and mood  Mood is fairly good today           Assessment & Plan:   Problem List Items Addressed This Visit      Other   Hyperlipidemia    Disc goals for lipids and reasons to control them Rev last labs with pt Rev low sat fat diet in detail  LDL is up to 109  Continues crestor 40 Suspect diet changed Will work on this and re check 3 mo for f/u      Depression with anxiety    Worse lately with pandemic and other stressors Reviewed stressors/ coping techniques/symptoms/ support sources/ tx options and side effects in detail today Suggested  counseling and he will think about it  Enc good exercise and self care Also limit etoh  Disc further at 3 mo f/u      Attention deficit disorder    Pt takes his metadate prn and it continues to work well  No significant side effects       Routine general medical examination at a health care facility - Primary    Reviewed health habits including diet and exercise and skin cancer prevention Reviewed appropriate screening tests for age  Also reviewed health mt list, fam hx and immunization status , as well as social and family history   See HPI Labs reviewed   Commended re: wt loss Disc shingrix vaccine-will check on coverage Rev psa and prostate health  Better bp after 2nd check  Lifestyle change enc -keep working on  Disc possible early depression-offered counseling and he may consider that later Enc good self care during the pandemic and other stressors         Prostate cancer screening    Lab Results  Component Value Date   PSA 2.05 05/04/2019   PSA 1.53 04/28/2018   PSA 1.77 04/14/2017   Up slightly- will continue to watch  Suspect growth of prostate with age Baseline nocturia times 2       Current use of proton pump inhibitor    GERD has improved lately  Using pantoprazole  Enc to watch diet       B12 deficiency    Lab Results  Component Value Date   VITAMINB12 390 05/04/2019   pt takes ppi  In nl range with current oral supplementation        Prediabetes    Lab Results  Component Value Date   HGBA1C 6.5 05/04/2019   This is up /borderline for DM disc imp of low glycemic diet and wt loss to prevent DM2  Will re check/f/u in 3 mo

## 2019-05-07 NOTE — Assessment & Plan Note (Signed)
Lab Results  Component Value Date   VITAMINB12 390 05/04/2019   pt takes ppi  In nl range with current oral supplementation

## 2019-05-07 NOTE — Assessment & Plan Note (Signed)
Pt takes his metadate prn and it continues to work well  No significant side effects

## 2019-05-07 NOTE — Patient Instructions (Addendum)
If you are interested in the new shingles vaccine (Shingrix) - call your local pharmacy to check on coverage and availability  If affordable, get on a wait list at your pharmacy to get the vaccine.  Call us if /when you are ready for a counseling appointment for stress/mood/depression   You are borderline for diabetes  Please watch sugar and processed carbs in your diet  Try to get most of your carbohydrates from produce (with the exception of white potatoes)  Eat less bread/pasta/rice/snack foods/cereals/sweets and other items from the middle of the grocery store (processed carbs)  Keep walking  Also try to minimize alcohol   For cholesterol Avoid red meat/ fried foods/ egg yolks/ fatty breakfast meats/ butter, cheese and high fat dairy/ and shellfish    Follow up in 3 months with labs prior  Take care of yourself

## 2019-05-08 NOTE — Assessment & Plan Note (Signed)
Disc goals for lipids and reasons to control them Rev last labs with pt Rev low sat fat diet in detail  LDL is up to 109  Continues crestor 40 Suspect diet changed Will work on this and re check 3 mo for f/u

## 2019-05-08 NOTE — Assessment & Plan Note (Signed)
Lab Results  Component Value Date   HGBA1C 6.5 05/04/2019   This is up /borderline for DM disc imp of low glycemic diet and wt loss to prevent DM2  Will re check/f/u in 3 mo

## 2019-05-08 NOTE — Assessment & Plan Note (Signed)
Lab Results  Component Value Date   PSA 2.05 05/04/2019   PSA 1.53 04/28/2018   PSA 1.77 04/14/2017   Up slightly- will continue to watch  Suspect growth of prostate with age Baseline nocturia times 2

## 2019-05-08 NOTE — Assessment & Plan Note (Signed)
Reviewed health habits including diet and exercise and skin cancer prevention Reviewed appropriate screening tests for age  Also reviewed health mt list, fam hx and immunization status , as well as social and family history   See HPI Labs reviewed  Commended re: wt loss Disc shingrix vaccine-will check on coverage Rev psa and prostate health  Better bp after 2nd check  Lifestyle change enc -keep working on  Disc possible early depression-offered counseling and he may consider that later Enc good self care during the pandemic and other stressors

## 2019-05-08 NOTE — Assessment & Plan Note (Signed)
Worse lately with pandemic and other stressors Reviewed stressors/ coping techniques/symptoms/ support sources/ tx options and side effects in detail today Suggested counseling and he will think about it  Enc good exercise and self care Also limit etoh  Disc further at 3 mo f/u

## 2019-05-18 ENCOUNTER — Telehealth: Payer: Self-pay | Admitting: Family Medicine

## 2019-05-18 DIAGNOSIS — F418 Other specified anxiety disorders: Secondary | ICD-10-CM

## 2019-05-18 NOTE — Telephone Encounter (Signed)
Patient called.  At patient's last visit with Dr.Tower, patient mentioned depression.  Dr.Tower asked patient to call back it he'd like a referral.  Patient is requesting a referral for counseling. Patient is interested in seeing Rodena Piety or [place].

## 2019-05-18 NOTE — Telephone Encounter (Signed)
Referral done

## 2019-05-28 ENCOUNTER — Other Ambulatory Visit: Payer: Self-pay | Admitting: *Deleted

## 2019-05-28 MED ORDER — ROSUVASTATIN CALCIUM 40 MG PO TABS: 40.0000 mg | ORAL_TABLET | Freq: Every day | ORAL | 1 refills | Status: DC

## 2019-06-07 ENCOUNTER — Ambulatory Visit (INDEPENDENT_AMBULATORY_CARE_PROVIDER_SITE_OTHER): Payer: BC Managed Care – PPO | Admitting: Psychology

## 2019-06-07 DIAGNOSIS — F908 Attention-deficit hyperactivity disorder, other type: Secondary | ICD-10-CM

## 2019-06-07 DIAGNOSIS — F4323 Adjustment disorder with mixed anxiety and depressed mood: Secondary | ICD-10-CM

## 2019-06-24 ENCOUNTER — Ambulatory Visit (INDEPENDENT_AMBULATORY_CARE_PROVIDER_SITE_OTHER): Payer: BC Managed Care – PPO | Admitting: Psychology

## 2019-06-24 ENCOUNTER — Telehealth: Payer: Self-pay

## 2019-06-24 DIAGNOSIS — F908 Attention-deficit hyperactivity disorder, other type: Secondary | ICD-10-CM | POA: Diagnosis not present

## 2019-06-24 DIAGNOSIS — F4323 Adjustment disorder with mixed anxiety and depressed mood: Secondary | ICD-10-CM | POA: Diagnosis not present

## 2019-06-24 NOTE — Telephone Encounter (Signed)
Patient called stating that he was in on 05/07/2019 for his CPE and discussed with Dr Glori Bickers symptoms of depression/anxiety he was having. He has seen Alene Mires for counseling several times and per Alene Mires was advised that patient would benefit from a lower dose of anti depressant or anti anxiety medication. Patient was not sure if he needs another visit here to discuss or how to proceed. Please review. Patient aware he may not get a call back until tomorrow.

## 2019-06-24 NOTE — Telephone Encounter (Signed)
I pended a px for zoloft 50 mg to send to pref pharmacy  Take 1/2 pill each evening for 7 days and if well tolerated then increase to 1 pill daily  These medications can take a month or so to work  Should help with anxiety and also depression   Common side effects initially - feeling foggy/ nauseated -this is generally brief  Less common- worse depression (stop med if this occurs and alert [place])   Make appt for virtual visit in 2-4 weeks or sooner if he desires

## 2019-06-25 MED ORDER — SERTRALINE HCL 50 MG PO TABS: 50.0000 mg | ORAL_TABLET | Freq: Every day | ORAL | 3 refills | Status: DC

## 2019-06-25 NOTE — Telephone Encounter (Signed)
Pt notified of Dr. Marliss Coots, Rx sent to pharmacy and f/u appt scheduled

## 2019-06-25 NOTE — Telephone Encounter (Signed)
Left VM requesting pt to call the office back 

## 2019-06-25 NOTE — Telephone Encounter (Signed)
Pt left v/m that he was in a meeting when she called and asked [place] to call pt back after 3PM this afternoon.

## 2019-06-28 ENCOUNTER — Telehealth: Payer: Self-pay

## 2019-06-28 NOTE — Telephone Encounter (Signed)
Pt notified of Dr. Tower's instructions and verbalized understanding  

## 2019-06-28 NOTE — Telephone Encounter (Signed)
Pt's wife(DPR signed) left v/m that their son has tested positive for covid 06/28/19; pt has not been around his son; Pt has no covid symptoms except starting Sunday 06/27/19 pt started zoloft and then developed diarrhea but no diarrhea today. Pt did take his son's wife somewhere and was only in car for 10 mins or less and both wore mask.pts daughter in law does not have any covid symptoms. Pt wants to know if he should be concerned and if needs testing when should pt be tested. UC & ED precautions given and pt and pts wife voiced understanding. Pt's wife request cb.

## 2019-06-28 NOTE — Telephone Encounter (Signed)
If he was not around his son in the past then no need to be tested.  If he was- then I recommend testing no less than 5 days after exposure (and quarantine for 2 weeks following exposure.    He needs to know if the wife develops covid however.

## 2019-07-13 ENCOUNTER — Encounter: Payer: Self-pay | Admitting: Family Medicine

## 2019-07-13 ENCOUNTER — Ambulatory Visit (INDEPENDENT_AMBULATORY_CARE_PROVIDER_SITE_OTHER): Payer: BC Managed Care – PPO | Admitting: Family Medicine

## 2019-07-13 DIAGNOSIS — F988 Other specified behavioral and emotional disorders with onset usually occurring in childhood and adolescence: Secondary | ICD-10-CM

## 2019-07-13 DIAGNOSIS — F418 Other specified anxiety disorders: Secondary | ICD-10-CM | POA: Diagnosis not present

## 2019-07-13 NOTE — Progress Notes (Signed)
Virtual Visit via Video Note  I connected with Benjamin Bullock on 07/13/19 at 10:45 AM EST by a video enabled telemedicine application and verified that I am speaking with the correct person using two identifiers.  Location: Patient: home Provider: office    I discussed the limitations of evaluation and management by telemedicine and the availability of in person appointments. The patient expressed understanding and agreed to proceed.  Parties involved in encounter  Patient: Benjamin Bullock  Provider:  Loura Pardon MD   History of Present Illness: Pt presents for f/u of anxiety and depression symptoms   He started zoloft for anxiety/depression  He had diarrhea to start with it -now that is decreased (he is watching this)   He thinks it is helping the anxiety (feeling of stress)   When anxious - he obs with details / difficulty concentrating -this is improving  Does feel down at times  Checks things more than he needs to   Did not notice worsening of any mood symptoms    Exercise is helpful for him -he continues   Seeing psychologist - Alene Mires  He thinks it helped some    He is working remotely  Stress is intense at times  He is missing daily contact -hard on him  Also lack of routine   Is able to get help from team mates   Unsure if retirement some day will help  PHQ score 4  Takes metadate for ADD Does not take all the time-dislikes this  It does improve concentration   Patient Active Problem List   Diagnosis Date Noted  . Lower abdominal pain 08/18/2018  . Prediabetes 05/22/2017  . B12 deficiency 04/22/2017  . Current use of proton pump inhibitor 04/15/2016  . Anxiety 08/20/2013  . Allergic drug reaction 01/29/2012  . Back pain 02/05/2011  . Routine general medical examination at a health care facility 11/08/2010  . Prostate cancer screening 11/08/2010  . GERD 09/20/2009  . ANXIETY STATE NOS 01/08/2007  . Attention deficit disorder 01/08/2007  .  Hyperlipidemia 01/07/2007  . ERECTILE DYSFUNCTION 01/07/2007  . Depression with anxiety 01/07/2007  . Allergic rhinitis 01/07/2007   Past Medical History:  Diagnosis Date  . ADD (attention deficit disorder)   . Allergy    allergic rhinitis  . Anxiety   . Bursitis of hip, right   . Depression   . GERD (gastroesophageal reflux disease)   . History of meniscal tear    left knee meniscal tear  . Hyperlipidemia    History reviewed. No pertinent surgical history. Social History   Tobacco Use  . Smoking status: Former Smoker    Quit date: 1978    Years since quitting: 43.0  . Smokeless tobacco: Never Used  . Tobacco comment: Smoked for about one year 30 years ago  Substance Use Topics  . Alcohol use: Yes    Alcohol/week: 0.0 standard drinks    Comment: occ  . Drug use: Never   Family History  Problem Relation Age of Onset  . Heart disease Father        CAD  . Hypertension Mother   . Depression Mother   . Heart disease Paternal Uncle        CAD  . COPD Maternal Grandfather   . Colon cancer Neg Hx   . Colon polyps Neg Hx   . Rectal cancer Neg Hx   . Stomach cancer Neg Hx   . Esophageal cancer Neg Hx    Allergies  Allergen Reactions  . Aleve [Naproxen Sodium] Swelling    Face and lips swollen and hives  . Omeprazole     REACTION: not effective   Current Outpatient Medications on File Prior to Visit  Medication Sig Dispense Refill  . baclofen (LIORESAL) 10 MG tablet TAKE 1 TABLET BY MOUTH TWICE A DAY AS NEEDED CAN MAKE YOU DROWSY    . methylphenidate (METADATE ER) 20 MG ER tablet Take 1 tablet (20 mg total) by mouth daily. (Patient taking differently: Take 20 mg by mouth daily as needed. ) 30 tablet 0  . pantoprazole (PROTONIX) 40 MG tablet Take 1 tablet (40 mg total) by mouth 2 (two) times daily. 180 tablet 1  . rosuvastatin (CRESTOR) 40 MG tablet Take 1 tablet (40 mg total) by mouth daily. 90 tablet 1  . sertraline (ZOLOFT) 50 MG tablet Take 1 tablet (50 mg total)  by mouth daily. 30 tablet 3  . sildenafil (REVATIO) 20 MG tablet TAKE 3 TABLETS BY MOUTH AS NEEDED FOR 30 MINUTES BEFORE SEXUAL ACTIVITY 30 tablet 5  . traMADol (ULTRAM) 50 MG tablet Take 1 tablet (50 mg total) by mouth 2 (two) times daily as needed. 30 tablet 0   No current facility-administered medications on file prior to visit.     Review of Systems  Constitutional: Negative for chills, fever and malaise/fatigue.  HENT: Negative for congestion, ear pain, sinus pain and sore throat.   Eyes: Negative for blurred vision, discharge and redness.  Respiratory: Negative for cough, shortness of breath and stridor.   Cardiovascular: Negative for chest pain, palpitations and leg swelling.  Gastrointestinal: Negative for abdominal pain, diarrhea, nausea and vomiting.  Musculoskeletal: Negative for myalgias.  Skin: Negative for rash.  Neurological: Negative for dizziness and headaches.  Psychiatric/Behavioral: Positive for depression. Negative for memory loss and suicidal ideas. The patient is nervous/anxious.     Observations/Objective: Patient appears well, in no distress Seems his usual self  Weight is baseline  No facial swelling or asymmetry Normal voice-not hoarse and no slurred speech Speech is rapid with short attention span (changes topics quickly)-this is his baseline  No obvious tremor or mobility impairment Moving neck and UEs normally Able to hear the call well  No cough or shortness of breath during interview  Talkative and mentally sharp with no cognitive changes-good historian No skin changes on face or neck , no rash or pallor Affect is mildly anxious , not tearful  Mood is fair today  Assessment and Plan: Problem List Items Addressed This Visit      Other   Depression with anxiety - Primary    Some mild improvement so far with 25 mg of zoloft  Also causing loose stool- if this continues or worsens with advancement of dose we will consider changing to a different  SSRI Reviewed stressors/ coping techniques/symptoms/ support sources/ tx options and side effects in detail today  Enc to continue counseling Also exercise  Consider yoga and /or meditation  Update [place] in 1 mo  Also enc him to take his ADD med regularly -this would reduce stress and help with mood      Attention deficit disorder    I feel that taking his methylphenidate more regulalry may reduce stress/help anxiety  Enc him to do so          Follow Up Instructions: Continue the zoloft at the 1/2 dose for now  I'm glad you have noted some improvement  Update [place] in another month If diarrhea  worsens or if going up on the dose causes this to worsen we will change to a similar medication in the same class Continue counseling if helpful Continue exercise Consider practicing meditation /also yoga  Self care is important   I think treating your ADD regularly will further help mood- I encourage you to take your medication daily    I discussed the assessment and treatment plan with the patient. The patient was provided an opportunity to ask questions and all were answered. The patient agreed with the plan and demonstrated an understanding of the instructions.   The patient was advised to call back or seek an in-person evaluation if the symptoms worsen or if the condition fails to improve as anticipated.     Loura Pardon, MD

## 2019-07-13 NOTE — Assessment & Plan Note (Signed)
Some mild improvement so far with 25 mg of zoloft  Also causing loose stool- if this continues or worsens with advancement of dose we will consider changing to a different SSRI Reviewed stressors/ coping techniques/symptoms/ support sources/ tx options and side effects in detail today  Enc to continue counseling Also exercise  Consider yoga and /or meditation  Update [place] in 1 mo  Also enc him to take his ADD med regularly -this would reduce stress and help with mood

## 2019-07-13 NOTE — Assessment & Plan Note (Signed)
I feel that taking his methylphenidate more regulalry may reduce stress/help anxiety  Enc him to do so

## 2019-07-13 NOTE — Patient Instructions (Addendum)
Continue the zoloft at the 1/2 dose for now  I'm glad you have noted some improvement  Update [place] in another month If diarrhea worsens or if going up on the dose causes this to worsen we will change to a similar medication in the same class Continue counseling if helpful Continue exercise Consider practicing meditation /also yoga  Self care is important   I think treating your ADD regularly will further help mood- I encourage you to take your medication daily

## 2019-07-28 ENCOUNTER — Other Ambulatory Visit: Payer: Self-pay | Admitting: *Deleted

## 2019-07-28 MED ORDER — PANTOPRAZOLE SODIUM 40 MG PO TBEC: 40.0000 mg | DELAYED_RELEASE_TABLET | Freq: Two times a day (BID) | ORAL | 1 refills | Status: DC

## 2019-07-30 ENCOUNTER — Other Ambulatory Visit: Payer: BC Managed Care – PPO

## 2019-08-06 ENCOUNTER — Ambulatory Visit: Payer: BC Managed Care – PPO | Admitting: Family Medicine

## 2019-09-19 ENCOUNTER — Other Ambulatory Visit: Payer: Self-pay | Admitting: Family Medicine

## 2019-10-28 DIAGNOSIS — Z85828 Personal history of other malignant neoplasm of skin: Secondary | ICD-10-CM | POA: Diagnosis not present

## 2019-10-28 DIAGNOSIS — L570 Actinic keratosis: Secondary | ICD-10-CM | POA: Diagnosis not present

## 2019-10-28 DIAGNOSIS — D1801 Hemangioma of skin and subcutaneous tissue: Secondary | ICD-10-CM | POA: Diagnosis not present

## 2019-10-28 DIAGNOSIS — D485 Neoplasm of uncertain behavior of skin: Secondary | ICD-10-CM | POA: Diagnosis not present

## 2019-10-28 DIAGNOSIS — D225 Melanocytic nevi of trunk: Secondary | ICD-10-CM | POA: Diagnosis not present

## 2019-10-28 DIAGNOSIS — L821 Other seborrheic keratosis: Secondary | ICD-10-CM | POA: Diagnosis not present

## 2019-10-29 ENCOUNTER — Other Ambulatory Visit: Payer: BC Managed Care – PPO

## 2019-11-02 ENCOUNTER — Ambulatory Visit: Payer: BC Managed Care – PPO | Admitting: Family Medicine

## 2019-11-08 ENCOUNTER — Other Ambulatory Visit (INDEPENDENT_AMBULATORY_CARE_PROVIDER_SITE_OTHER): Payer: BC Managed Care – PPO

## 2019-11-08 DIAGNOSIS — E7800 Pure hypercholesterolemia, unspecified: Secondary | ICD-10-CM | POA: Diagnosis not present

## 2019-11-08 DIAGNOSIS — R7303 Prediabetes: Secondary | ICD-10-CM | POA: Diagnosis not present

## 2019-11-08 LAB — COMPREHENSIVE METABOLIC PANEL
ALT: 24 U/L (ref 0–53)
AST: 23 U/L (ref 0–37)
Albumin: 4.5 g/dL (ref 3.5–5.2)
Alkaline Phosphatase: 51 U/L (ref 39–117)
BUN: 14 mg/dL (ref 6–23)
CO2: 29 mEq/L (ref 19–32)
Calcium: 9 mg/dL (ref 8.4–10.5)
Chloride: 104 mEq/L (ref 96–112)
Creatinine, Ser: 0.92 mg/dL (ref 0.40–1.50)
GFR: 81.04 mL/min (ref >60.00)
Glucose, Bld: 115 mg/dL — ABNORMAL HIGH (ref 70–99)
Potassium: 4 mEq/L (ref 3.5–5.1)
Sodium: 140 mEq/L (ref 135–145)
Total Bilirubin: 0.8 mg/dL (ref 0.2–1.2)
Total Protein: 6.8 g/dL (ref 6.0–8.3)

## 2019-11-08 LAB — LIPID PANEL
Cholesterol: 172 mg/dL (ref 0–200)
HDL: 55 mg/dL (ref >39.00)
LDL Cholesterol: 99 mg/dL (ref 0–99)
NonHDL: 116.75
Total CHOL/HDL Ratio: 3
Triglycerides: 87 mg/dL (ref 0.0–149.0)
VLDL: 17.4 mg/dL (ref 0.0–40.0)

## 2019-11-08 LAB — HEMOGLOBIN A1C: Hgb A1c MFr Bld: 6.1 % (ref 4.6–6.5)

## 2019-11-11 ENCOUNTER — Other Ambulatory Visit: Payer: Self-pay

## 2019-11-11 ENCOUNTER — Ambulatory Visit (INDEPENDENT_AMBULATORY_CARE_PROVIDER_SITE_OTHER): Payer: BC Managed Care – PPO | Admitting: Family Medicine

## 2019-11-11 ENCOUNTER — Encounter: Payer: Self-pay | Admitting: Family Medicine

## 2019-11-11 VITALS — BP 136/84 | HR 57 | Temp 97.1°F | Ht 67.0 in | Wt 178.4 lb

## 2019-11-11 DIAGNOSIS — F418 Other specified anxiety disorders: Secondary | ICD-10-CM | POA: Diagnosis not present

## 2019-11-11 DIAGNOSIS — R7303 Prediabetes: Secondary | ICD-10-CM | POA: Diagnosis not present

## 2019-11-11 DIAGNOSIS — E7800 Pure hypercholesterolemia, unspecified: Secondary | ICD-10-CM

## 2019-11-11 NOTE — Assessment & Plan Note (Signed)
Lab Results  Component Value Date   HGBA1C 6.1 11/08/2019   Significant improvement with better diet and some wt loss Commended disc imp of low glycemic diet and wt loss to prevent DM2

## 2019-11-11 NOTE — Patient Instructions (Addendum)
Cholesterol and glucose are better controlled   Keep up regular exercise   Take care of yourself  Glad mood is better also

## 2019-11-11 NOTE — Progress Notes (Signed)
Subjective:    Patient ID: Benjamin Bullock, male    DOB: 11/05/1950, 69 y.o.   MRN: [id number]  This visit occurred during the SARS-CoV-2 public health emergency.  Safety protocols were in place, including screening questions prior to the visit, additional usage of staff PPE, and extensive cleaning of exam room while observing appropriate contact time as indicated for disinfecting solutions.    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  Had covid vaccines   Having shoulder issues- after baseball injury  Going to [facility] clinic     BP Readings from Last 3 Encounters:  11/11/19 136/84  05/07/19 137/80  10/01/18 (!) 130/92   Pulse Readings from Last 3 Encounters:  11/11/19 (!) 57  05/07/19 62  10/01/18 64     Seen for anxiety and depression in late December  Taking zoloft 25 mg daily -no longer taking it /does not need now  Stress is less- more activity and getting back to his usual work schedule     Hyperlipidemia  Lab Results  Component Value Date   CHOL 172 11/08/2019   CHOL 189 05/04/2019   CHOL 168 04/28/2018   Lab Results  Component Value Date   HDL 55.00 11/08/2019   HDL 57.00 05/04/2019   HDL 51.70 04/28/2018   Lab Results  Component Value Date   LDLCALC 99 11/08/2019   LDLCALC 109 (H) 05/04/2019   LDLCALC 97 04/28/2018   Lab Results  Component Value Date   TRIG 87.0 11/08/2019   TRIG 117.0 05/04/2019   TRIG 99.0 04/28/2018   Lab Results  Component Value Date   CHOLHDL 3 11/08/2019   CHOLHDL 3 05/04/2019   CHOLHDL 3 04/28/2018   Lab Results  Component Value Date   LDLDIRECT 130.9 10/25/2008   LDLDIRECT 141.4 03/16/2007   LDLDIRECT 139.6 09/18/2006   Taking rosuvastatin 40 mg daily  Diet - is better overall   Prediabetes Lab Results  Component Value Date   HGBA1C 6.1 11/08/2019  eating less carbs-has really helped  Avoiding sweets/desserts  This is down from 6.5    Patient Active Problem List   Diagnosis Date Noted  . Lower abdominal pain 08/18/2018  . Prediabetes 05/22/2017  . B12 deficiency 04/22/2017  . Current use of proton pump inhibitor 04/15/2016  . Anxiety 08/20/2013  . Allergic drug reaction 01/29/2012  . Back pain 02/05/2011  . Routine general medical examination at a health care facility 11/08/2010  . Prostate cancer screening 11/08/2010  . GERD 09/20/2009  . ANXIETY STATE NOS 01/08/2007  . Attention deficit disorder 01/08/2007  . Hyperlipidemia 01/07/2007  . ERECTILE DYSFUNCTION 01/07/2007  . Depression with anxiety 01/07/2007  . Allergic rhinitis 01/07/2007   Past Medical History:  Diagnosis Date  . ADD (attention deficit disorder)   . Allergy    allergic rhinitis  . Anxiety   . Bursitis of hip, right   . Depression   . GERD (gastroesophageal reflux disease)   . History of meniscal tear    left knee meniscal tear  . Hyperlipidemia    History reviewed. No pertinent surgical history. Social History  Tobacco Use  . Smoking status: Former Smoker    Quit date: 1978    Years since quitting: 43.3  . Smokeless tobacco: Never Used  . Tobacco comment: Smoked for about one year 30 years ago  Substance Use Topics  . Alcohol use: Yes    Alcohol/week: 0.0 standard drinks    Comment: occ  . Drug use: Never   Family History  Problem Relation Age of Onset  . Heart disease Father        CAD  . Hypertension Mother   . Depression Mother   . Heart disease Paternal Uncle        CAD  . COPD Maternal Grandfather   . Colon cancer Neg Hx   . Colon polyps Neg Hx   . Rectal cancer Neg Hx   . Stomach cancer Neg Hx   . Esophageal cancer Neg Hx    Allergies  Allergen  Reactions  . Aleve [Naproxen Sodium] Swelling    Face and lips swollen and hives  . Omeprazole     REACTION: not effective   Current Outpatient Medications on File Prior to Visit  Medication Sig Dispense Refill  . baclofen (LIORESAL) 10 MG tablet TAKE 1 TABLET BY MOUTH TWICE A DAY AS NEEDED CAN MAKE YOU DROWSY    . methylphenidate (METADATE ER) 20 MG ER tablet Take 1 tablet (20 mg total) by mouth daily. (Patient taking differently: Take 20 mg by mouth daily as needed. ) 30 tablet 0  . pantoprazole (PROTONIX) 40 MG tablet Take 1 tablet (40 mg total) by mouth 2 (two) times daily. 180 tablet 1  . rosuvastatin (CRESTOR) 40 MG tablet Take 1 tablet (40 mg total) by mouth daily. 90 tablet 1  . sildenafil (REVATIO) 20 MG tablet TAKE 3 TABLETS BY MOUTH AS NEEDED FOR 30 MINUTES BEFORE SEXUAL ACTIVITY 30 tablet 5  . traMADol (ULTRAM) 50 MG tablet Take 1 tablet (50 mg total) by mouth 2 (two) times daily as needed. 30 tablet 0   No current facility-administered medications on file prior to visit.    Review of Systems  Constitutional: Negative for activity change, appetite change, fatigue, fever and unexpected weight change.  HENT: Negative for congestion, rhinorrhea, sore throat and trouble swallowing.   Eyes: Negative for pain, redness, itching and visual disturbance.  Respiratory: Negative for cough, chest tightness, shortness of breath and wheezing.   Cardiovascular: Negative for chest pain and palpitations.  Gastrointestinal: Negative for abdominal pain, blood in stool, constipation, diarrhea and nausea.  Endocrine: Negative for cold intolerance, heat intolerance, polydipsia and polyuria.  Genitourinary: Negative for difficulty urinating, dysuria, frequency and urgency.  Musculoskeletal: Negative for arthralgias, joint swelling and myalgias.       Right shoulder pain-seeing orthopedics   Skin: Negative for pallor and rash.  Neurological: Negative for dizziness, tremors, weakness, numbness and  headaches.  Hematological: Negative for adenopathy. Does not bruise/bleed easily.  Psychiatric/Behavioral: Negative for decreased concentration and dysphoric mood. The patient is not nervous/anxious.        Objective:   Physical Exam Constitutional:      General: He is not in acute distress.    Appearance: Normal appearance. He is well-developed and normal weight. He is not ill-appearing.  HENT:     Head: Normocephalic and atraumatic.  Eyes:     Conjunctiva/sclera: Conjunctivae normal.     Pupils: Pupils are equal, round, and reactive to light.  Neck:     Thyroid: No thyromegaly.     Vascular:  No carotid bruit or JVD.  Cardiovascular:     Rate and Rhythm: Normal rate and regular rhythm.     Heart sounds: Normal heart sounds. No gallop.   Pulmonary:     Effort: Pulmonary effort is normal. No respiratory distress.     Breath sounds: Normal breath sounds. No wheezing or rales.  Abdominal:     General: Bowel sounds are normal. There is no distension or abdominal bruit.  Musculoskeletal:     Cervical back: Normal range of motion and neck supple.  Lymphadenopathy:     Cervical: No cervical adenopathy.  Skin:    General: Skin is warm and dry.     Findings: No rash.  Neurological:     Mental Status: He is alert.     Sensory: No sensory deficit.     Coordination: Coordination normal.     Deep Tendon Reflexes: Reflexes are normal and symmetric.  Psychiatric:        Mood and Affect: Mood and affect normal.     Comments: Pleasant and talkative Good mood           Assessment & Plan:   Problem List Items Addressed This Visit      Other   Hyperlipidemia    Improved with rosuvastatin 40mg  and better diet  LDL down to 99 Commended Disc goals for lipids and reasons to control them Rev last labs with pt Rev low sat fat diet in detail       Depression with anxiety    Much improvement with more exercise/better lifestyle and return to work in office  Stopped his sertraline  as he did not need it  Enc to continue ADD medication when needed so he does not feel overwhelmed  Reviewed stressors/ coping techniques/symptoms/ support sources/ tx options and side effects in detail today  Encouraged continued better self care      Prediabetes - Primary    Lab Results  Component Value Date   HGBA1C 6.1 11/08/2019   Significant improvement with better diet and some wt loss Commended disc imp of low glycemic diet and wt loss to prevent DM2

## 2019-11-11 NOTE — Assessment & Plan Note (Signed)
Much improvement with more exercise/better lifestyle and return to work in office  Stopped his sertraline as he did not need it  Enc to continue ADD medication when needed so he does not feel overwhelmed  Reviewed stressors/ coping techniques/symptoms/ support sources/ tx options and side effects in detail today  Encouraged continued better self care

## 2019-11-11 NOTE — Assessment & Plan Note (Signed)
Improved with rosuvastatin 40mg  and better diet  LDL down to 99 Commended Disc goals for lipids and reasons to control them Rev last labs with pt Rev low sat fat diet in detail

## 2019-11-24 ENCOUNTER — Other Ambulatory Visit: Payer: Self-pay | Admitting: Family Medicine

## 2019-12-02 ENCOUNTER — Other Ambulatory Visit: Payer: Self-pay | Admitting: Sports Medicine

## 2019-12-02 DIAGNOSIS — M25511 Pain in right shoulder: Secondary | ICD-10-CM

## 2019-12-02 DIAGNOSIS — S46011A Strain of muscle(s) and tendon(s) of the rotator cuff of right shoulder, initial encounter: Secondary | ICD-10-CM | POA: Diagnosis not present

## 2020-01-10 ENCOUNTER — Other Ambulatory Visit: Payer: Self-pay

## 2020-01-10 ENCOUNTER — Ambulatory Visit
Admission: RE | Admit: 2020-01-10 | Discharge: 2020-01-10 | Disposition: A | Discharge status: Home / Self Care | Payer: BC Managed Care – PPO | Source: Ambulatory Visit | Attending: Sports Medicine | Admitting: Sports Medicine

## 2020-01-10 DIAGNOSIS — M25511 Pain in right shoulder: Secondary | ICD-10-CM | POA: Diagnosis not present

## 2020-02-01 DIAGNOSIS — S46011D Strain of muscle(s) and tendon(s) of the rotator cuff of right shoulder, subsequent encounter: Secondary | ICD-10-CM | POA: Diagnosis not present

## 2020-02-17 ENCOUNTER — Other Ambulatory Visit: Payer: Self-pay | Admitting: Family Medicine

## 2020-02-22 ENCOUNTER — Other Ambulatory Visit: Payer: Self-pay

## 2020-02-22 MED ORDER — METHYLPHENIDATE HCL ER 20 MG PO TBCR: 20.0000 mg | EXTENDED_RELEASE_TABLET | Freq: Every day | ORAL | 0 refills | Status: DC | PRN

## 2020-02-22 NOTE — Telephone Encounter (Signed)
Name of Medication: methylphenidate/generic ritalin 20 mg. Name of Pharmacy:[facility] or Written Date and Quantity: # 30 on 01/27/2019 Last Office Visit and Type:11/11/2019 FU  Next Office Visit and Type: 05/09/20 CPX

## 2020-02-26 ENCOUNTER — Other Ambulatory Visit: Payer: Self-pay | Admitting: Family Medicine

## 2020-04-17 ENCOUNTER — Other Ambulatory Visit: Payer: Self-pay | Admitting: Family Medicine

## 2020-04-20 ENCOUNTER — Telehealth: Payer: Self-pay | Admitting: Family Medicine

## 2020-04-20 NOTE — Telephone Encounter (Signed)
Pt notified of Dr. Marliss Coots comments. He also was advise his Rx was sent to pharmacy already so he will check with pharmacy

## 2020-04-20 NOTE — Telephone Encounter (Signed)
Pt called stating is his out of his rosuvcastatin Has been out for a couple days Can you send rx to [facility] stated he cannot get refilled till 10/6.  He wanted to know what he needed to do.

## 2020-04-20 NOTE — Telephone Encounter (Signed)
His preference but does not have to wait- can get them at the same time

## 2020-04-20 NOTE — Telephone Encounter (Signed)
Pt also wanted to know if he should get a covid booster and flu shot at same time.  If not how long does he need to wait in between vaccine

## 2020-05-02 ENCOUNTER — Other Ambulatory Visit: Payer: BC Managed Care – PPO

## 2020-05-09 ENCOUNTER — Encounter: Payer: BC Managed Care – PPO | Admitting: Family Medicine

## 2020-05-12 ENCOUNTER — Other Ambulatory Visit: Payer: Self-pay | Admitting: Family Medicine

## 2020-05-15 ENCOUNTER — Telehealth: Payer: Self-pay | Admitting: Family Medicine

## 2020-05-15 DIAGNOSIS — E7800 Pure hypercholesterolemia, unspecified: Secondary | ICD-10-CM

## 2020-05-15 DIAGNOSIS — E538 Deficiency of other specified B group vitamins: Secondary | ICD-10-CM

## 2020-05-15 DIAGNOSIS — Z0000 Encounter for general adult medical examination without abnormal findings: Secondary | ICD-10-CM

## 2020-05-15 DIAGNOSIS — R7303 Prediabetes: Secondary | ICD-10-CM

## 2020-05-15 DIAGNOSIS — Z125 Encounter for screening for malignant neoplasm of prostate: Secondary | ICD-10-CM

## 2020-05-15 NOTE — Telephone Encounter (Signed)
-----   Message from Cloyd Stagers, RT sent at 05/01/2020  2:24 PM EDT ----- Regarding: Lab Orders for Tuesday 10.26.2021 Please place lab orders for Tuesday 10.26.2021, office visit for physical on Friday 10.29.2021 Thank you, Dyke Maes RT(R)

## 2020-05-16 ENCOUNTER — Other Ambulatory Visit (INDEPENDENT_AMBULATORY_CARE_PROVIDER_SITE_OTHER): Payer: BC Managed Care – PPO

## 2020-05-16 ENCOUNTER — Other Ambulatory Visit: Payer: Self-pay

## 2020-05-16 DIAGNOSIS — R7303 Prediabetes: Secondary | ICD-10-CM | POA: Diagnosis not present

## 2020-05-16 DIAGNOSIS — E538 Deficiency of other specified B group vitamins: Secondary | ICD-10-CM | POA: Diagnosis not present

## 2020-05-16 DIAGNOSIS — Z0000 Encounter for general adult medical examination without abnormal findings: Secondary | ICD-10-CM

## 2020-05-16 DIAGNOSIS — Z125 Encounter for screening for malignant neoplasm of prostate: Secondary | ICD-10-CM

## 2020-05-16 DIAGNOSIS — E7800 Pure hypercholesterolemia, unspecified: Secondary | ICD-10-CM | POA: Diagnosis not present

## 2020-05-16 LAB — LIPID PANEL
Cholesterol: 171 mg/dL (ref 0–200)
HDL: 57.4 mg/dL (ref >39.00)
LDL Cholesterol: 98 mg/dL (ref 0–99)
NonHDL: 113.42
Total CHOL/HDL Ratio: 3
Triglycerides: 78 mg/dL (ref 0.0–149.0)
VLDL: 15.6 mg/dL (ref 0.0–40.0)

## 2020-05-16 LAB — COMPREHENSIVE METABOLIC PANEL
ALT: 28 U/L (ref 0–53)
AST: 25 U/L (ref 0–37)
Albumin: 4.3 g/dL (ref 3.5–5.2)
Alkaline Phosphatase: 45 U/L (ref 39–117)
BUN: 10 mg/dL (ref 6–23)
CO2: 30 mEq/L (ref 19–32)
Calcium: 9 mg/dL (ref 8.4–10.5)
Chloride: 102 mEq/L (ref 96–112)
Creatinine, Ser: 0.86 mg/dL (ref 0.40–1.50)
GFR: 86.96 mL/min (ref >60.00)
Glucose, Bld: 105 mg/dL — ABNORMAL HIGH (ref 70–99)
Potassium: 4.4 mEq/L (ref 3.5–5.1)
Sodium: 137 mEq/L (ref 135–145)
Total Bilirubin: 0.7 mg/dL (ref 0.2–1.2)
Total Protein: 6.1 g/dL (ref 6.0–8.3)

## 2020-05-16 LAB — CBC WITH DIFFERENTIAL/PLATELET
Basophils Absolute: 0 10*3/uL (ref 0.0–0.1)
Basophils Relative: 1.3 % (ref 0.0–3.0)
Eosinophils Absolute: 0 10*3/uL (ref 0.0–0.7)
Eosinophils Relative: 1.2 % (ref 0.0–5.0)
HCT: 42.7 % (ref 39.0–52.0)
Hemoglobin: 14.5 g/dL (ref 13.0–17.0)
Lymphocytes Relative: 36.5 % (ref 12.0–46.0)
Lymphs Abs: 1.4 10*3/uL (ref 0.7–4.0)
MCHC: 34 g/dL (ref 30.0–36.0)
MCV: 91.3 fl (ref 78.0–100.0)
Monocytes Absolute: 0.4 10*3/uL (ref 0.1–1.0)
Monocytes Relative: 9.4 % (ref 3.0–12.0)
Neutro Abs: 2 10*3/uL (ref 1.4–7.7)
Neutrophils Relative %: 51.6 % (ref 43.0–77.0)
Platelets: 175 10*3/uL (ref 150.0–400.0)
RBC: 4.67 Mil/uL (ref 4.22–5.81)
RDW: 13.6 % (ref 11.5–15.5)
WBC: 3.9 10*3/uL — ABNORMAL LOW (ref 4.0–10.5)

## 2020-05-16 LAB — HEMOGLOBIN A1C: Hgb A1c MFr Bld: 6.3 % (ref 4.6–6.5)

## 2020-05-16 LAB — PSA: PSA: 1.51 ng/mL (ref 0.10–4.00)

## 2020-05-16 LAB — TSH: TSH: 2.69 u[IU]/mL (ref 0.35–4.50)

## 2020-05-16 LAB — VITAMIN B12: Vitamin B-12: 330 pg/mL (ref 211–911)

## 2020-05-19 ENCOUNTER — Encounter: Payer: Self-pay | Admitting: Family Medicine

## 2020-05-19 ENCOUNTER — Other Ambulatory Visit: Payer: Self-pay

## 2020-05-19 ENCOUNTER — Ambulatory Visit (INDEPENDENT_AMBULATORY_CARE_PROVIDER_SITE_OTHER): Payer: BC Managed Care – PPO | Admitting: Family Medicine

## 2020-05-19 VITALS — BP 131/80 | HR 75 | Temp 97.4°F | Ht 66.5 in | Wt 172.1 lb

## 2020-05-19 DIAGNOSIS — E538 Deficiency of other specified B group vitamins: Secondary | ICD-10-CM | POA: Diagnosis not present

## 2020-05-19 DIAGNOSIS — F988 Other specified behavioral and emotional disorders with onset usually occurring in childhood and adolescence: Secondary | ICD-10-CM

## 2020-05-19 DIAGNOSIS — R7303 Prediabetes: Secondary | ICD-10-CM

## 2020-05-19 DIAGNOSIS — K219 Gastro-esophageal reflux disease without esophagitis: Secondary | ICD-10-CM

## 2020-05-19 DIAGNOSIS — Z0000 Encounter for general adult medical examination without abnormal findings: Secondary | ICD-10-CM | POA: Diagnosis not present

## 2020-05-19 DIAGNOSIS — F418 Other specified anxiety disorders: Secondary | ICD-10-CM

## 2020-05-19 DIAGNOSIS — Z125 Encounter for screening for malignant neoplasm of prostate: Secondary | ICD-10-CM

## 2020-05-19 DIAGNOSIS — K5790 Diverticulosis of intestine, part unspecified, without perforation or abscess without bleeding: Secondary | ICD-10-CM | POA: Insufficient documentation

## 2020-05-19 MED ORDER — SILDENAFIL CITRATE 20 MG PO TABS
ORAL_TABLET | ORAL | 5 refills | Status: DC
Start: 2020-05-19 — End: 2021-06-19

## 2020-05-19 MED ORDER — ROSUVASTATIN CALCIUM 40 MG PO TABS
40.0000 mg | ORAL_TABLET | Freq: Every day | ORAL | 3 refills | Status: DC
Start: 2020-05-19 — End: 2021-05-28

## 2020-05-19 MED ORDER — PANTOPRAZOLE SODIUM 40 MG PO TBEC
40.0000 mg | DELAYED_RELEASE_TABLET | Freq: Two times a day (BID) | ORAL | 3 refills | Status: DC
Start: 2020-05-19 — End: 2021-06-19

## 2020-05-19 NOTE — Progress Notes (Signed)
Subjective:    Patient ID: Benjamin Bullock, male    DOB: 11/06/1950, 69 y.o.   MRN: [id number]  This visit occurred during the SARS-CoV-2 public health emergency.  Safety protocols were in place, including screening questions prior to the visit, additional usage of staff PPE, and extensive cleaning of exam room while observing appropriate contact time as indicated for disinfecting solutions.    HPI Here for health maintenance exam and to review chronic medical problems    Wt Readings from Last 3 Encounters:  05/19/20 172 lb 2 oz (78.1 kg)  11/11/19 178 lb 6 oz (80.9 kg)  05/07/19 178 lb (80.7 kg)   27.37 kg/m   Has been ok overall  Wt is down 6 lb  Taking care of himself  Always stressed  Still working full time   Walking for exercise at least 4 d per week / no longer runs  At least an hour    Tdap 9/12 covid vaccine-pfizer, had his booster 4 wk ago  Flu shot 10/21 Zoster status 11/14 zostavax   Colonoscopy 3/20 with 3 y recall   Prostate health  Lab Results  Component Value Date   PSA 1.51 05/16/2020   PSA 2.05 05/04/2019   PSA 1.53 04/28/2018  no issues  Drinks a lot of water and that helps  Nocturia once per night - baseline   BP Readings from Last 3 Encounters:  05/19/20 (!) 146/84  11/11/19 136/84  05/07/19 137/80  re check BP: 131/80   Pulse Readings from Last 3 Encounters:  05/19/20 75  11/11/19 (!) 57  05/07/19 62     ADD- metadate ER 20 mg - does not take it that often/knows he should  Doing ok /no big changes    GERD-protonix 40 mg  Stomach issues for 2 weeks -diverticulosis  He does eat oatmeal daily occ nausea  May need to quit coffee    4 cups per day    Has B12 def Lab Results  Component Value Date   VITAMINB12 330 05/16/2020  stable  Oral supplementation  Taking B12 but not regularly   ED- sildenafil   Mood-not more anxious   Hyperlipidemia Lab Results  Component Value Date   CHOL 171 05/16/2020   CHOL 172  11/08/2019   CHOL 189 05/04/2019   Lab Results  Component Value Date   HDL 57.40 05/16/2020   HDL 55.00 11/08/2019   HDL 57.00 05/04/2019   Lab Results  Component Value Date   LDLCALC 98 05/16/2020   LDLCALC 99 11/08/2019   LDLCALC 109 (H) 05/04/2019   Lab Results  Component Value Date   TRIG 78.0 05/16/2020   TRIG 87.0 11/08/2019   TRIG 117.0 05/04/2019   Lab Results  Component Value Date   CHOLHDL 3 05/16/2020   CHOLHDL 3 11/08/2019   CHOLHDL 3 05/04/2019   Lab Results  Component Value Date   LDLDIRECT 130.9 10/25/2008   LDLDIRECT 141.4 03/16/2007   LDLDIRECT 139.6 09/18/2006  crestor 40 mg and diet   Prediabetes Lab Results  Component Value Date   HGBA1C 6.3 05/16/2020  stable Up from 6.1  He has been cutting back on desserts and bread   Lab Results  Component Value Date   WBC 3.9 (L) 05/16/2020   HGB 14.5 05/16/2020   HCT 42.7 05/16/2020   MCV 91.3 05/16/2020   PLT 175.0 05/16/2020   Lab Results  Component Value Date   TSH 2.69 05/16/2020   Lab Results  Component  Value Date   CREATININE 0.86 05/16/2020   BUN 10 05/16/2020   NA 137 05/16/2020   K 4.4 05/16/2020   CL 102 05/16/2020   CO2 30 05/16/2020   Patient Active Problem List   Diagnosis Date Noted  . Diverticulosis 05/19/2020  . Lower abdominal pain 08/18/2018  . Prediabetes 05/22/2017  . B12 deficiency 04/22/2017  . Current use of proton pump inhibitor 04/15/2016  . Anxiety 08/20/2013  . Allergic drug reaction 01/29/2012  . Back pain 02/05/2011  . Routine general medical examination at a health care facility 11/08/2010  . Prostate cancer screening 11/08/2010  . GERD 09/20/2009  . ANXIETY STATE NOS 01/08/2007  . Attention deficit disorder 01/08/2007  . Hyperlipidemia 01/07/2007  . ERECTILE DYSFUNCTION 01/07/2007  . Depression with anxiety 01/07/2007  . Allergic rhinitis 01/07/2007   Past Medical History:  Diagnosis Date  . ADD (attention deficit disorder)   . Allergy     allergic rhinitis  . Anxiety   . Bursitis of hip, right   . Depression   . GERD (gastroesophageal reflux disease)   . History of meniscal tear    left knee meniscal tear  . Hyperlipidemia    History reviewed. No pertinent surgical history. Social History   Tobacco Use  . Smoking status: Former Smoker    Quit date: 1978    Years since quitting: 43.8  . Smokeless tobacco: Never Used  . Tobacco comment: Smoked for about one year 30 years ago  Vaping Use  . Vaping Use: Never used  Substance Use Topics  . Alcohol use: Yes    Alcohol/week: 0.0 standard drinks    Comment: occ  . Drug use: Never   Family History  Problem Relation Age of Onset  . Heart disease Father        CAD  . Hypertension Mother   . Depression Mother   . Heart disease Paternal Uncle        CAD  . COPD Maternal Grandfather   . Colon cancer Neg Hx   . Colon polyps Neg Hx   . Rectal cancer Neg Hx   . Stomach cancer Neg Hx   . Esophageal cancer Neg Hx    Allergies  Allergen Reactions  . Aleve [Naproxen Sodium] Swelling    Face and lips swollen and hives  . Omeprazole     REACTION: not effective   Current Outpatient Medications on File Prior to Visit  Medication Sig Dispense Refill  . baclofen (LIORESAL) 10 MG tablet TAKE 1 TABLET BY MOUTH TWICE A DAY AS NEEDED CAN MAKE YOU DROWSY    . methylphenidate (METADATE ER) 20 MG ER tablet Take 1 tablet (20 mg total) by mouth daily as needed. 30 tablet 0   No current facility-administered medications on file prior to visit.    Review of Systems  Constitutional: Negative for activity change, appetite change, fatigue, fever and unexpected weight change.  HENT: Negative for congestion, rhinorrhea, sore throat and trouble swallowing.   Eyes: Negative for pain, redness, itching and visual disturbance.  Respiratory: Negative for cough, chest tightness, shortness of breath and wheezing.   Cardiovascular: Negative for chest pain and palpitations.  Gastrointestinal:  Positive for nausea. Negative for abdominal distention, abdominal pain, blood in stool, constipation, diarrhea and vomiting.       Abd discomfort with a little nausea   Endocrine: Negative for cold intolerance, heat intolerance, polydipsia and polyuria.  Genitourinary: Negative for difficulty urinating, dysuria, frequency and urgency.  Musculoskeletal: Negative for  arthralgias, joint swelling and myalgias.  Skin: Negative for pallor and rash.  Neurological: Negative for dizziness, tremors, weakness, numbness and headaches.  Hematological: Negative for adenopathy. Does not bruise/bleed easily.  Psychiatric/Behavioral: Negative for decreased concentration and dysphoric mood. The patient is not nervous/anxious.        Objective:   Physical Exam Constitutional:      General: He is not in acute distress.    Appearance: Normal appearance. He is well-developed and normal weight. He is not ill-appearing or diaphoretic.  HENT:     Head: Normocephalic and atraumatic.     Right Ear: Tympanic membrane, ear canal and external ear normal.     Left Ear: Tympanic membrane, ear canal and external ear normal.     Nose: Nose normal. No congestion.     Mouth/Throat:     Mouth: Mucous membranes are moist.     Pharynx: Oropharynx is clear. No posterior oropharyngeal erythema.  Eyes:     General: No scleral icterus.       Right eye: No discharge.        Left eye: No discharge.     Conjunctiva/sclera: Conjunctivae normal.     Pupils: Pupils are equal, round, and reactive to light.  Neck:     Thyroid: No thyromegaly.     Vascular: No carotid bruit or JVD.  Cardiovascular:     Rate and Rhythm: Normal rate and regular rhythm.     Pulses: Normal pulses.     Heart sounds: Normal heart sounds. No gallop.   Pulmonary:     Effort: Pulmonary effort is normal. No respiratory distress.     Breath sounds: Normal breath sounds. No wheezing or rales.     Comments: Good air exch Chest:     Chest wall: No  tenderness.  Abdominal:     General: Bowel sounds are normal. There is no distension or abdominal bruit.     Palpations: Abdomen is soft. There is no mass.     Hernia: No hernia is present.     Comments: Palp of abdomen causes some general discomfort  No one area of tenderness however  Musculoskeletal:        General: No tenderness.     Cervical back: Normal range of motion and neck supple. No rigidity. No muscular tenderness.     Right lower leg: No edema.     Left lower leg: No edema.  Lymphadenopathy:     Cervical: No cervical adenopathy.  Skin:    General: Skin is warm and dry.     Coloration: Skin is not pale.     Findings: No erythema or rash.     Comments: Solar lentigines diffusely   Neurological:     Mental Status: He is alert.     Cranial Nerves: No cranial nerve deficit.     Motor: No abnormal muscle tone.     Coordination: Coordination normal.     Gait: Gait normal.     Deep Tendon Reflexes: Reflexes are normal and symmetric. Reflexes normal.  Psychiatric:        Mood and Affect: Mood normal.        Cognition and Memory: Cognition and memory normal.     Comments: Pleasant  Mildly anxious           Assessment & Plan:   Problem List Items Addressed This Visit      Digestive   GERD    protonix continues to work well (has to take bid)  Enc strongly to wean from coffee      Relevant Medications   pantoprazole (PROTONIX) 40 MG tablet   Diverticulosis    Pt has some vague abd discomfort lately -thinks it may be from diverticulosis  Disc inc fiber / use of citrucel  Clear fluids for 1-2 d  Also disc wean from coffee for all GI issues          Other   Depression with anxiety    Per pt doing well lately with mood  Stressors stick around      Attention deficit disorder    Pt takes metadate ER 20 mg daily prn  Enc him to use daily as he needs it for concentration       Routine general medical examination at a health care facility - Primary     Reviewed health habits including diet and exercise and skin cancer prevention Reviewed appropriate screening tests for age  Also reviewed health mt list, fam hx and immunization status , as well as social and family history   See HPI Labs reviewed   covid vaccinated with booster  No prostate issues /stable psa         Prostate cancer screening    No clinical changes Lab Results  Component Value Date   PSA 1.51 05/16/2020   PSA 2.05 05/04/2019   PSA 1.53 04/28/2018    No fam hx of prostate cancer      B12 deficiency    Lab Results  Component Value Date   VITAMINB12 330 05/16/2020   Oral supplementation  Stressed imp of compliance for this       Prediabetes    Lab Results  Component Value Date   HGBA1C 6.3 05/16/2020  disc imp of low glycemic diet and wt loss to prevent DM2  Enc to continue cutting back on desserts and bread

## 2020-05-19 NOTE — Patient Instructions (Addendum)
A fiber supplement daily like citrucel can help diverticular problems   You may want to consider a liquid diet for a few days   I think you need to wean off coffee- for your GI symptoms and blood pressure   Get back on B12 regularly

## 2020-05-21 NOTE — Assessment & Plan Note (Signed)
Pt has some vague abd discomfort lately -thinks it may be from diverticulosis  Disc inc fiber / use of citrucel  Clear fluids for 1-2 d  Also disc wean from coffee for all GI issues

## 2020-05-21 NOTE — Assessment & Plan Note (Signed)
protonix continues to work well (has to take bid)  Enc strongly to wean from coffee

## 2020-05-21 NOTE — Assessment & Plan Note (Signed)
Lab Results  Component Value Date   VITAMINB12 330 05/16/2020   Oral supplementation  Stressed imp of compliance for this

## 2020-05-21 NOTE — Assessment & Plan Note (Signed)
No clinical changes Lab Results  Component Value Date   PSA 1.51 05/16/2020   PSA 2.05 05/04/2019   PSA 1.53 04/28/2018    No fam hx of prostate cancer

## 2020-05-21 NOTE — Assessment & Plan Note (Signed)
Pt takes metadate ER 20 mg daily prn  Enc him to use daily as he needs it for concentration

## 2020-05-21 NOTE — Assessment & Plan Note (Signed)
Lab Results  Component Value Date   HGBA1C 6.3 05/16/2020  disc imp of low glycemic diet and wt loss to prevent DM2  Enc to continue cutting back on desserts and bread

## 2020-05-21 NOTE — Assessment & Plan Note (Signed)
Per pt doing well lately with mood  Stressors stick around

## 2020-05-21 NOTE — Assessment & Plan Note (Signed)
Reviewed health habits including diet and exercise and skin cancer prevention Reviewed appropriate screening tests for age  Also reviewed health mt list, fam hx and immunization status , as well as social and family history   See HPI Labs reviewed   covid vaccinated with booster  No prostate issues /stable psa

## 2020-12-12 ENCOUNTER — Other Ambulatory Visit: Payer: Self-pay | Admitting: *Deleted

## 2020-12-12 MED ORDER — METHYLPHENIDATE HCL ER 20 MG PO TBCR
20.0000 mg | EXTENDED_RELEASE_TABLET | Freq: Every day | ORAL | 0 refills | Status: DC | PRN
Start: 2020-12-12 — End: 2021-11-29

## 2020-12-12 NOTE — Telephone Encounter (Signed)
Name of Medication: methylphenidate/generic ritalin 20 mg. Name of Pharmacy:[facility] or Written Date and Quantity: # 30 on 02/22/20 Last Office Visit and Type: 05/19/20 CPE Next Office Visit and Type: 05/28/21 CPE

## 2020-12-14 ENCOUNTER — Telehealth: Payer: Self-pay | Admitting: *Deleted

## 2020-12-14 NOTE — Telephone Encounter (Signed)
PA done at [email or web address] for pt's metadate, I will await a response

## 2021-02-27 ENCOUNTER — Telehealth: Payer: Self-pay

## 2021-02-27 NOTE — Telephone Encounter (Signed)
Thanks for the heads up.   Agree with advisement and will see him then

## 2021-02-27 NOTE — Telephone Encounter (Signed)
Pt said he is concerned that maybe he is having elevated BP. Pt said one month [facility] 148/100 but pt could have been nervous at that appt and has not had BP checked since then. Pt said at last visit with Dr Glori Bickers discussed with aging BP could go up. Pt still drinking 2 - 3 cups of coffee with caffeine a day. Pt said this week is little stressed due to going out of town next week and pt is still working. Pt has also been taking nyquil at night;has not taken recently per pt. Pt has no H/A,dizziness,CP,vision changes and does have slight SOB with exertion but no more than usually has. Pt scheduled appt on 03/02/21 at 3 PM with Dr Glori Bickers. Pt will try to cut back on caffeine, pt will stay cool and drink plenty of fluids. No covid symptoms per pt. Pt was offered sooner appt but not work with pts schedule. UC & ED precautions given and pt voiced understanding.Sending note to DR Glori Bickers and Pony CMA.

## 2021-03-02 ENCOUNTER — Ambulatory Visit: Payer: BC Managed Care – PPO | Admitting: Family Medicine

## 2021-05-20 ENCOUNTER — Telehealth: Payer: Self-pay | Admitting: Family Medicine

## 2021-05-20 DIAGNOSIS — R7303 Prediabetes: Secondary | ICD-10-CM

## 2021-05-20 DIAGNOSIS — Z125 Encounter for screening for malignant neoplasm of prostate: Secondary | ICD-10-CM

## 2021-05-20 DIAGNOSIS — E538 Deficiency of other specified B group vitamins: Secondary | ICD-10-CM

## 2021-05-20 DIAGNOSIS — Z0000 Encounter for general adult medical examination without abnormal findings: Secondary | ICD-10-CM

## 2021-05-20 DIAGNOSIS — E7800 Pure hypercholesterolemia, unspecified: Secondary | ICD-10-CM

## 2021-05-20 DIAGNOSIS — Z79899 Other long term (current) drug therapy: Secondary | ICD-10-CM

## 2021-05-20 NOTE — Telephone Encounter (Signed)
-----   Message from Ellamae Sia sent at 05/09/2021  8:23 AM EDT ----- Regarding: Lab orders for Monday, 10.31.22 Patient is scheduled for CPX labs, please order future labs, Thanks , Karna Christmas

## 2021-05-21 ENCOUNTER — Other Ambulatory Visit: Payer: BC Managed Care – PPO

## 2021-05-21 NOTE — Telephone Encounter (Signed)
Spoke to patient by telephone  and was advised that he plans on having his lab work done the day that he has his physical with Dr. Glori Bickers.  Patient stated that he did not go for his lab work today because he was not sure where to go.

## 2021-05-21 NOTE — Telephone Encounter (Signed)
PLEASE NOTE: All timestamps contained within this report are represented as [place] Standard Time. CONFIDENTIALTY NOTICE: This fax transmission is intended only for the addressee. It contains information that is legally privileged, confidential or otherwise protected from use or disclosure. If you are not the intended recipient, you are strictly prohibited from reviewing, disclosing, copying using or disseminating any of this information or taking any action in reliance on or regarding this information. If you have received this fax in error, please notify us immediately by telephone so that we can arrange for its return to [place]. Phone: [phone], Toll-Free: [phone], Fax: [phone] Page: 1 of 1 Call Id: [id number] [facility] Night - Client Nonclinical Telephone Record  [facility] Client [place] Night - Client Client Site [place] - Night Contact Type Call Who Is Calling Patient / Member / Family / Caregiver Caller Name [place] Phone Number [phone] Patient Name Benjamin Bullock Patient DOB 02-Apr-1951 Call Type Message Only Information Provided Reason for Call Request for General Office Information Initial Comment Caller's got blood work scheduled tomorrow and he is wanting to know where to go. Additional Comment Caller is needing to know where to go. Provided [place] address. Disp. Time Disposition Final User 05/20/2021 10:36:54 AM General Information Provided Yes Francee Gentile Call Closed By: Francee Gentile Transaction Date/Time: 05/20/2021 10:31:24 AM (ET)

## 2021-05-28 ENCOUNTER — Ambulatory Visit (INDEPENDENT_AMBULATORY_CARE_PROVIDER_SITE_OTHER): Payer: BC Managed Care – PPO | Admitting: Family Medicine

## 2021-05-28 ENCOUNTER — Encounter: Payer: Self-pay | Admitting: Family Medicine

## 2021-05-28 ENCOUNTER — Other Ambulatory Visit: Payer: Self-pay

## 2021-05-28 VITALS — BP 130/68 | HR 73 | Temp 98.2°F | Ht 66.5 in | Wt 176.0 lb

## 2021-05-28 DIAGNOSIS — H6120 Impacted cerumen, unspecified ear: Secondary | ICD-10-CM | POA: Insufficient documentation

## 2021-05-28 DIAGNOSIS — M545 Low back pain, unspecified: Secondary | ICD-10-CM

## 2021-05-28 DIAGNOSIS — Z79899 Other long term (current) drug therapy: Secondary | ICD-10-CM | POA: Diagnosis not present

## 2021-05-28 DIAGNOSIS — K219 Gastro-esophageal reflux disease without esophagitis: Secondary | ICD-10-CM

## 2021-05-28 DIAGNOSIS — Z1159 Encounter for screening for other viral diseases: Secondary | ICD-10-CM

## 2021-05-28 DIAGNOSIS — Z0000 Encounter for general adult medical examination without abnormal findings: Secondary | ICD-10-CM

## 2021-05-28 DIAGNOSIS — R7303 Prediabetes: Secondary | ICD-10-CM

## 2021-05-28 DIAGNOSIS — E559 Vitamin D deficiency, unspecified: Secondary | ICD-10-CM | POA: Insufficient documentation

## 2021-05-28 DIAGNOSIS — Z125 Encounter for screening for malignant neoplasm of prostate: Secondary | ICD-10-CM

## 2021-05-28 DIAGNOSIS — G8929 Other chronic pain: Secondary | ICD-10-CM

## 2021-05-28 DIAGNOSIS — E7800 Pure hypercholesterolemia, unspecified: Secondary | ICD-10-CM | POA: Diagnosis not present

## 2021-05-28 DIAGNOSIS — E538 Deficiency of other specified B group vitamins: Secondary | ICD-10-CM

## 2021-05-28 DIAGNOSIS — F988 Other specified behavioral and emotional disorders with onset usually occurring in childhood and adolescence: Secondary | ICD-10-CM

## 2021-05-28 DIAGNOSIS — H6123 Impacted cerumen, bilateral: Secondary | ICD-10-CM

## 2021-05-28 LAB — COMPREHENSIVE METABOLIC PANEL
ALT: 26 U/L (ref 0–53)
AST: 24 U/L (ref 0–37)
Albumin: 4.5 g/dL (ref 3.5–5.2)
Alkaline Phosphatase: 49 U/L (ref 39–117)
BUN: 13 mg/dL (ref 6–23)
CO2: 29 mEq/L (ref 19–32)
Calcium: 9.1 mg/dL (ref 8.4–10.5)
Chloride: 103 mEq/L (ref 96–112)
Creatinine, Ser: 0.83 mg/dL (ref 0.40–1.50)
GFR: 87.26 mL/min (ref >60.00)
Glucose, Bld: 111 mg/dL — ABNORMAL HIGH (ref 70–99)
Potassium: 4.4 mEq/L (ref 3.5–5.1)
Sodium: 139 mEq/L (ref 135–145)
Total Bilirubin: 0.9 mg/dL (ref 0.2–1.2)
Total Protein: 6.7 g/dL (ref 6.0–8.3)

## 2021-05-28 LAB — CBC WITH DIFFERENTIAL/PLATELET
Basophils Absolute: 0 10*3/uL (ref 0.0–0.1)
Basophils Relative: 1.1 % (ref 0.0–3.0)
Eosinophils Absolute: 0 10*3/uL (ref 0.0–0.7)
Eosinophils Relative: 0.6 % (ref 0.0–5.0)
HCT: 44.5 % (ref 39.0–52.0)
Hemoglobin: 14.9 g/dL (ref 13.0–17.0)
Lymphocytes Relative: 22.6 % (ref 12.0–46.0)
Lymphs Abs: 1.1 10*3/uL (ref 0.7–4.0)
MCHC: 33.6 g/dL (ref 30.0–36.0)
MCV: 90.8 fl (ref 78.0–100.0)
Monocytes Absolute: 0.3 10*3/uL (ref 0.1–1.0)
Monocytes Relative: 6.6 % (ref 3.0–12.0)
Neutro Abs: 3.2 10*3/uL (ref 1.4–7.7)
Neutrophils Relative %: 69.1 % (ref 43.0–77.0)
Platelets: 178 10*3/uL (ref 150.0–400.0)
RBC: 4.9 Mil/uL (ref 4.22–5.81)
RDW: 14.1 % (ref 11.5–15.5)
WBC: 4.7 10*3/uL (ref 4.0–10.5)

## 2021-05-28 LAB — LIPID PANEL
Cholesterol: 175 mg/dL (ref 0–200)
HDL: 60.2 mg/dL (ref >39.00)
LDL Cholesterol: 102 mg/dL — ABNORMAL HIGH (ref 0–99)
NonHDL: 114.95
Total CHOL/HDL Ratio: 3
Triglycerides: 66 mg/dL (ref 0.0–149.0)
VLDL: 13.2 mg/dL (ref 0.0–40.0)

## 2021-05-28 LAB — PSA: PSA: 1.76 ng/mL (ref 0.10–4.00)

## 2021-05-28 LAB — VITAMIN D 25 HYDROXY (VIT D DEFICIENCY, FRACTURES): VITD: 28.72 ng/mL — ABNORMAL LOW (ref 30.00–100.00)

## 2021-05-28 LAB — VITAMIN B12: Vitamin B-12: 405 pg/mL (ref 211–911)

## 2021-05-28 LAB — HEMOGLOBIN A1C: Hgb A1c MFr Bld: 6.1 % (ref 4.6–6.5)

## 2021-05-28 LAB — TSH: TSH: 1.91 u[IU]/mL (ref 0.35–5.50)

## 2021-05-28 MED ORDER — BACLOFEN 10 MG PO TABS: 10.0000 mg | ORAL_TABLET | Freq: Every day | ORAL | 1 refills | Status: DC | PRN

## 2021-05-28 MED ORDER — ROSUVASTATIN CALCIUM 40 MG PO TABS: 40.0000 mg | ORAL_TABLET | Freq: Every day | ORAL | 3 refills | Status: DC

## 2021-05-28 NOTE — Assessment & Plan Note (Signed)
Low risk  Hep c level added to labs

## 2021-05-28 NOTE — Assessment & Plan Note (Signed)
Uses baclofen occasional Not bothering him today

## 2021-05-28 NOTE — Assessment & Plan Note (Signed)
Dry cerumen  Affects hearing  Will use debrox at home and f/u in 2-4 weeks for irrigation

## 2021-05-28 NOTE — Progress Notes (Signed)
Subjective:    Patient ID: Benjamin Bullock, male    DOB: July 30, 1950, 70 y.o.   MRN: [id number]  This visit occurred during the SARS-CoV-2 public health emergency.  Safety protocols were in place, including screening questions prior to the visit, additional usage of staff PPE, and extensive cleaning of exam room while observing appropriate contact time as indicated for disinfecting solutions.   HPI Here for health maintenance exam and to review chronic medical problems    Wt Readings from Last 3 Encounters:  05/28/21 176 lb (79.8 kg)  05/19/20 172 lb 2 oz (78.1 kg)  11/11/19 178 lb 6 oz (80.9 kg)   27.98 kg/m  Doing ok/ hanging in there  Time is going very fast  Wt is down a bit from walking regularly  (making time for it, makes him feel better) Tries to eat right   Wife broke her wrist- had surgery and took care of her   Covid several months ago- got better   Covid vaccinated Tdap 03/2011 Pna vaccine utd Flu shot last month  Zoster status - has not had vaccine yet  Had shingles years ago    Colonoscopy 09/2018 with 3 yr recall  Prostate health Lab Results  Component Value Date   PSA 1.51 05/16/2020   PSA 2.05 05/04/2019   PSA 1.53 04/28/2018   No problems with urination  Nocturia 2 times, after drinking lots of water  During the day, somewhat frequent if he drinks a lot of water  Feels like he empties all the way   BP Readings from Last 3 Encounters:  05/28/21 130/68  05/19/20 131/80  11/11/19 136/84   Pulse Readings from Last 3 Encounters:  05/28/21 73  05/19/20 75  11/11/19 (!) 57   Sees dermatology 1-2 times per year  Has had spots removed in the past  None recent Uses sun protection and keeps shirt on   Due for labs today Hyperlipidemia Lab Results  Component Value Date   CHOL 171 05/16/2020   HDL 57.40 05/16/2020   LDLCALC 98 05/16/2020   LDLDIRECT 130.9 10/25/2008   TRIG 78.0 05/16/2020   CHOLHDL 3 05/16/2020  Takes crestor 40 mg daily   Diet has been better overall (eating less and making better choices)  Avoids red meat   GERD Protonix 40 mg bid    ADD Takes metadate 20 mg   B12 Lab Results  Component Value Date   VITAMINB12 330 05/16/2020   Takes oral supplement   Prediabetes Lab Results  Component Value Date   HGBA1C 6.3 05/16/2020   Diet -some pasta   Has wax build up in ears  Notes it with hearing issues  Patient Active Problem List   Diagnosis Date Noted   Encounter for hepatitis C screening test for low risk patient 05/28/2021   Cerumen impaction 05/28/2021   Diverticulosis 05/19/2020   Lower abdominal pain 08/18/2018   Prediabetes 05/22/2017   B12 deficiency 04/22/2017   Current use of proton pump inhibitor 04/15/2016   Anxiety 08/20/2013   Allergic drug reaction 01/29/2012   Back pain 02/05/2011   Routine general medical examination at a health care facility 11/08/2010   Prostate cancer screening 11/08/2010   GERD 09/20/2009   ANXIETY STATE NOS 01/08/2007   Attention deficit disorder 01/08/2007   Hyperlipidemia 01/07/2007   ERECTILE DYSFUNCTION 01/07/2007   Depression with anxiety 01/07/2007   Allergic rhinitis 01/07/2007   Past Medical History:  Diagnosis Date   ADD (attention deficit disorder)  Allergy    allergic rhinitis   Anxiety    Bursitis of hip, right    Depression    GERD (gastroesophageal reflux disease)    History of meniscal tear    left knee meniscal tear   Hyperlipidemia    History reviewed. No pertinent surgical history. Social History   Tobacco Use   Smoking status: Former    Types: Cigarettes    Quit date: 1978    Years since quitting: 44.8   Smokeless tobacco: Never   Tobacco comments:    Smoked for about one year 30 years ago  Vaping Use   Vaping Use: Never used  Substance Use Topics   Alcohol use: Yes    Alcohol/week: 0.0 standard drinks    Comment: occ   Drug use: Never   Family History  Problem Relation Age of Onset   Heart  disease Father        CAD   Hypertension Mother    Depression Mother    Heart disease Paternal Uncle        CAD   COPD Maternal Grandfather    Colon cancer Neg Hx    Colon polyps Neg Hx    Rectal cancer Neg Hx    Stomach cancer Neg Hx    Esophageal cancer Neg Hx    Allergies  Allergen Reactions   Aleve [Naproxen Sodium] Swelling    Face and lips swollen and hives   Omeprazole     REACTION: not effective   Current Outpatient Medications on File Prior to Visit  Medication Sig Dispense Refill   methylphenidate (METADATE ER) 20 MG ER tablet Take 1 tablet (20 mg total) by mouth daily as needed. 30 tablet 0   pantoprazole (PROTONIX) 40 MG tablet Take 1 tablet (40 mg total) by mouth 2 (two) times daily. 180 tablet 3   sildenafil (REVATIO) 20 MG tablet TAKE 3 TABLETS BY MOUTH 30 MINUTES BEFORE SEXUAL ACTIVITY AS NEEDED 30 tablet 5   No current facility-administered medications on file prior to visit.    Review of Systems  Constitutional:  Negative for activity change, appetite change, fatigue, fever and unexpected weight change.  HENT:  Negative for congestion, rhinorrhea, sore throat and trouble swallowing.   Eyes:  Negative for pain, redness, itching and visual disturbance.  Respiratory:  Negative for cough, chest tightness, shortness of breath and wheezing.   Cardiovascular:  Negative for chest pain and palpitations.  Gastrointestinal:  Negative for abdominal pain, blood in stool, constipation, diarrhea and nausea.  Endocrine: Negative for cold intolerance, heat intolerance, polydipsia and polyuria.  Genitourinary:  Negative for difficulty urinating, dysuria, frequency and urgency.  Musculoskeletal:  Negative for arthralgias, joint swelling and myalgias.  Skin:  Negative for pallor and rash.  Neurological:  Negative for dizziness, tremors, weakness, numbness and headaches.  Hematological:  Negative for adenopathy. Does not bruise/bleed easily.  Psychiatric/Behavioral:  Negative  for decreased concentration and dysphoric mood. The patient is not nervous/anxious.       Objective:   Physical Exam Constitutional:      General: He is not in acute distress.    Appearance: Normal appearance. He is well-developed and normal weight. He is not ill-appearing or diaphoretic.  HENT:     Head: Normocephalic and atraumatic.     Right Ear: Tympanic membrane, ear canal and external ear normal.     Left Ear: Tympanic membrane, ear canal and external ear normal.     Nose: Nose normal. No congestion.  Mouth/Throat:     Mouth: Mucous membranes are moist.     Pharynx: Oropharynx is clear. No posterior oropharyngeal erythema.  Eyes:     General: No scleral icterus.       Right eye: No discharge.        Left eye: No discharge.     Conjunctiva/sclera: Conjunctivae normal.     Pupils: Pupils are equal, round, and reactive to light.  Neck:     Thyroid: No thyromegaly.     Vascular: No carotid bruit or JVD.  Cardiovascular:     Rate and Rhythm: Normal rate and regular rhythm.     Pulses: Normal pulses.     Heart sounds: Normal heart sounds.    No gallop.  Pulmonary:     Effort: Pulmonary effort is normal. No respiratory distress.     Breath sounds: Normal breath sounds. No wheezing or rales.     Comments: Good air exch Chest:     Chest wall: No tenderness.  Abdominal:     General: Bowel sounds are normal. There is no distension or abdominal bruit.     Palpations: Abdomen is soft. There is no mass.     Tenderness: There is no abdominal tenderness.     Hernia: No hernia is present.  Musculoskeletal:        General: No tenderness.     Cervical back: Normal range of motion and neck supple. No rigidity. No muscular tenderness.     Right lower leg: No edema.     Left lower leg: No edema.     Comments: No kyphosis  No acute joint changes   Lymphadenopathy:     Cervical: No cervical adenopathy.  Skin:    General: Skin is warm and dry.     Coloration: Skin is not pale.      Findings: No erythema or rash.     Comments: Solar lentigines diffusely  Some sks Solar aging  Neurological:     Mental Status: He is alert.     Cranial Nerves: No cranial nerve deficit.     Motor: No abnormal muscle tone.     Coordination: Coordination normal.     Gait: Gait normal.     Deep Tendon Reflexes: Reflexes are normal and symmetric. Reflexes normal.  Psychiatric:        Mood and Affect: Mood normal.        Cognition and Memory: Cognition and memory normal.     Comments: Pleasant  attentive          Assessment & Plan:   Problem List Items Addressed This Visit       Digestive   GERD - Primary    Takes protonix bid and unable to back off so far  Vit B12 added to labs along with vitamin D level        Nervous and Auditory   Cerumen impaction    Dry cerumen  Affects hearing  Will use debrox at home and f/u in 2-4 weeks for irrigation         Other   Hyperlipidemia    Disc goals for lipids and reasons to control them Rev last labs with pt Rev low sat fat diet in detail  Taking crestor 40 mg daily  Diet is improved Lab today      Relevant Medications   rosuvastatin (CRESTOR) 40 MG tablet   Attention deficit disorder    Takes his metadate as needed bp is stable  Routine general medical examination at a health care facility    Reviewed health habits including diet and exercise and skin cancer prevention Reviewed appropriate screening tests for age  Also reviewed health mt list, fam hx and immunization status , as well as social and family history   See HPI Labs ordered Would consider shingrix if covered Aware colonoscopy is due in march/ 3 y recall Flu shot up to date psa added to labs /no urinary changes  Enc him to keep walking and add bike for exercise       Prostate cancer screening    psa added to labs  No change in urination/some nocturia with big water intake Lab Results  Component Value Date   PSA 1.51 05/16/2020   PSA  2.05 05/04/2019   PSA 1.53 04/28/2018          Back pain    Uses baclofen occasional Not bothering him today      Relevant Medications   baclofen (LIORESAL) 10 MG tablet   Current use of proton pump inhibitor    Balanced diet  B12 and D levels added to labs      B12 deficiency    Takes oral supplement  Lab check today      Prediabetes    A1C today  Diet is improved Exercising  disc imp of low glycemic diet and wt loss to prevent DM2       Encounter for hepatitis C screening test for low risk patient    Low risk  Hep c level added to labs      Relevant Orders   Hepatitis C antibody

## 2021-05-28 NOTE — Assessment & Plan Note (Signed)
Takes his metadate as needed bp is stable

## 2021-05-28 NOTE — Assessment & Plan Note (Signed)
Takes oral supplement  Lab check today

## 2021-05-28 NOTE — Assessment & Plan Note (Signed)
Disc goals for lipids and reasons to control them Rev last labs with pt Rev low sat fat diet in detail  Taking crestor 40 mg daily  Diet is improved Lab today

## 2021-05-28 NOTE — Assessment & Plan Note (Signed)
Reviewed health habits including diet and exercise and skin cancer prevention Reviewed appropriate screening tests for age  Also reviewed health mt list, fam hx and immunization status , as well as social and family history   See HPI Labs ordered Would consider shingrix if covered Aware colonoscopy is due in march/ 3 y recall Flu shot up to date psa added to labs /no urinary changes  Enc him to keep walking and add bike for exercise

## 2021-05-28 NOTE — Assessment & Plan Note (Signed)
psa added to labs  No change in urination/some nocturia with big water intake Lab Results  Component Value Date   PSA 1.51 05/16/2020   PSA 2.05 05/04/2019   PSA 1.53 04/28/2018

## 2021-05-28 NOTE — Assessment & Plan Note (Signed)
A1C today  Diet is improved Exercising  disc imp of low glycemic diet and wt loss to prevent DM2

## 2021-05-28 NOTE — Patient Instructions (Addendum)
If you are interested in the shingles vaccine series (Shingrix), call your insurance or pharmacy to check on coverage and location it must be given.  If affordable - you can schedule it here or at your pharmacy depending on coverage   I think you are due for a colonoscopy in march for 3 year recall  If you need a referral, let us know   Get debrox solution over the counter (or something similar)  Use it 2-3 times per week as directed  Follow up in 2-4 weeks for an ear flush   Labs today

## 2021-05-28 NOTE — Assessment & Plan Note (Signed)
Balanced diet  B12 and D levels added to labs

## 2021-05-28 NOTE — Assessment & Plan Note (Signed)
Takes protonix bid and unable to back off so far  Vit B12 added to labs along with vitamin D level

## 2021-05-29 LAB — HEPATITIS C ANTIBODY
Hepatitis C Ab: NONREACTIVE
SIGNAL TO CUT-OFF: 0.25 (ref <1.00)

## 2021-06-01 ENCOUNTER — Telehealth: Payer: Self-pay | Admitting: Family Medicine

## 2021-06-01 ENCOUNTER — Encounter: Payer: Self-pay | Admitting: *Deleted

## 2021-06-01 NOTE — Telephone Encounter (Signed)
Letter mailed with labs and Dr. Tower's comments  

## 2021-06-01 NOTE — Telephone Encounter (Signed)
Pt has called requesting to get a copy of his labs ailed to him. He usually does get them but doesn't know what is happening this time  Please advise  [address] [place] [phone]

## 2021-06-16 ENCOUNTER — Other Ambulatory Visit: Payer: Self-pay | Admitting: Family Medicine

## 2021-06-17 ENCOUNTER — Other Ambulatory Visit: Payer: Self-pay | Admitting: Family Medicine

## 2021-11-19 ENCOUNTER — Ambulatory Visit: Payer: BC Managed Care – PPO | Admitting: Family Medicine

## 2021-11-19 ENCOUNTER — Encounter: Payer: Self-pay | Admitting: Family Medicine

## 2021-11-19 VITALS — BP 132/80 | HR 83 | Temp 97.9°F | Ht 66.5 in | Wt 182.4 lb

## 2021-11-19 DIAGNOSIS — F988 Other specified behavioral and emotional disorders with onset usually occurring in childhood and adolescence: Secondary | ICD-10-CM | POA: Diagnosis not present

## 2021-11-19 DIAGNOSIS — R030 Elevated blood-pressure reading, without diagnosis of hypertension: Secondary | ICD-10-CM

## 2021-11-19 DIAGNOSIS — H6123 Impacted cerumen, bilateral: Secondary | ICD-10-CM | POA: Diagnosis not present

## 2021-11-19 DIAGNOSIS — F418 Other specified anxiety disorders: Secondary | ICD-10-CM

## 2021-11-19 MED ORDER — SERTRALINE HCL 25 MG PO TABS: 25.0000 mg | ORAL_TABLET | Freq: Every day | ORAL | 3 refills | Status: DC

## 2021-11-19 NOTE — Assessment & Plan Note (Signed)
bp was up at another office for life insurance exam after 2 c of coffee and anxious  ? ?Today in nl range/baseline  ?

## 2021-11-19 NOTE — Progress Notes (Signed)
? ?Subjective:  ? ? Patient ID: Benjamin Bullock, male    DOB: 03-05-1951, 71 y.o.   MRN: [id number] ? ?HPI ?Pt presents for ear cerumen and BP concern ?More anxious  ? ?Wt Readings from Last 3 Encounters:  ?11/19/21 182 lb 6.4 oz (82.7 kg)  ?05/28/21 176 lb (79.8 kg)  ?05/19/20 172 lb 2 oz (78.1 kg)  ? ?29.00 kg/m? ? ?Used debrox in his ears  ?At last  ? ?Has tried to loose some weight  ?Cut carbs  ? ? ?Has to have a statement of health for life insurance  ?Was in a [facility] (after 2 cups of coffee this am)  ? ? ?BP Readings from Last 3 Encounters:  ?11/19/21 132/80  ?05/28/21 130/68  ?05/19/20 131/80  ? ?Pulse Readings from Last 3 Encounters:  ?11/19/21 83  ?05/28/21 73  ?05/19/20 75  ? ?Has metadate for ADD  ?Makes him hungry when it wears off  ? ?Episodic need to take a deep breath   ? ?Stress ?Son-heart attack  ?Was hacked/financially also  ? ?Also down at times  ?Grumpy  ?Zoloft in the past  ? ?No SI  ?Does not want to hurt himself  ?A lot of worry  ?Zoloft briefly in the past  ? ?Lab Results  ?Component Value Date  ? HGBA1C 6.1 05/28/2021  ? ?Is interested in counseling  ? ?Patient Active Problem List  ? Diagnosis Date Noted  ? Encounter for hepatitis C screening test for low risk patient 05/28/2021  ? Cerumen impaction 05/28/2021  ? Vitamin D deficiency 05/28/2021  ? Diverticulosis 05/19/2020  ? Lower abdominal pain 08/18/2018  ? Prediabetes 05/22/2017  ? B12 deficiency 04/22/2017  ? Current use of proton pump inhibitor 04/15/2016  ? Anxiety 08/20/2013  ? Allergic drug reaction 01/29/2012  ? Back pain 02/05/2011  ? Routine general medical examination at a health care facility 11/08/2010  ? Prostate cancer screening 11/08/2010  ? GERD 09/20/2009  ? ANXIETY STATE NOS 01/08/2007  ? Attention deficit disorder 01/08/2007  ? Hyperlipidemia 01/07/2007  ? ERECTILE DYSFUNCTION 01/07/2007  ? Depression with anxiety 01/07/2007  ? Allergic rhinitis 01/07/2007  ? ?Past Medical History:  ?Diagnosis Date  ?  ADD (attention deficit disorder)   ? Allergy   ? allergic rhinitis  ? Anxiety   ? Bursitis of hip, right   ? Depression   ? GERD (gastroesophageal reflux disease)   ? History of meniscal tear   ? left knee meniscal tear  ? Hyperlipidemia   ? ?History reviewed. No pertinent surgical history. ?Social History  ? ?Tobacco Use  ? Smoking status: Former  ?  Types: Cigarettes  ?  Quit date: 32  ?  Years since quitting: 45.3  ? Smokeless tobacco: Never  ? Tobacco comments:  ?  Smoked for about one year 30 years ago  ?Vaping Use  ? Vaping Use: Never used  ?Substance Use Topics  ? Alcohol use: Yes  ?  Alcohol/week: 0.0 standard drinks  ?  Comment: occ  ? Drug use: Never  ? ?Family History  ?Problem Relation Age of Onset  ? Heart disease Father   ?     CAD  ? Hypertension Mother   ? Depression Mother   ? Heart disease Paternal Uncle   ?     CAD  ? COPD Maternal Grandfather   ? Colon cancer Neg Hx   ? Colon polyps Neg Hx   ? Rectal cancer Neg Hx   ?  Stomach cancer Neg Hx   ? Esophageal cancer Neg Hx   ? ?Allergies  ?Allergen Reactions  ? Aleve [Naproxen Sodium] Swelling  ?  Face and lips swollen and hives  ? Omeprazole   ?  REACTION: not effective  ? ?Current Outpatient Medications on File Prior to Visit  ?Medication Sig Dispense Refill  ? methylphenidate (METADATE ER) 20 MG ER tablet Take 1 tablet (20 mg total) by mouth daily as needed. 30 tablet 0  ? pantoprazole (PROTONIX) 40 MG tablet TAKE 1 TABLET BY MOUTH TWICE A DAY 180 tablet 2  ? rosuvastatin (CRESTOR) 40 MG tablet Take 1 tablet (40 mg total) by mouth daily. 90 tablet 3  ? sildenafil (REVATIO) 20 MG tablet TAKE 3 TABLETS BY MOUTH 30 MINUTES BEFORE SEXUAL ACTIVITY AS NEEDED 30 tablet 5  ? baclofen (LIORESAL) 10 MG tablet Take 1 tablet (10 mg total) by mouth daily as needed for muscle spasms. TAKE 1 TABLET BY MOUTH TWICE A DAY AS NEEDED CAN MAKE YOU DROWSY (Patient not taking: Reported on 11/19/2021) 30 each 1  ? ?No current facility-administered medications on file  prior to visit.  ?  ? ?Review of Systems  ?Constitutional:  Positive for fatigue. Negative for activity change, appetite change, fever and unexpected weight change.  ?HENT:  Negative for congestion, rhinorrhea, sore throat and trouble swallowing.   ?Eyes:  Negative for pain, redness, itching and visual disturbance.  ?Respiratory:  Negative for cough, chest tightness, shortness of breath and wheezing.   ?Cardiovascular:  Negative for chest pain and palpitations.  ?Gastrointestinal:  Negative for abdominal pain, blood in stool, constipation, diarrhea and nausea.  ?Endocrine: Negative for cold intolerance, heat intolerance, polydipsia and polyuria.  ?Genitourinary:  Negative for difficulty urinating, dysuria, frequency and urgency.  ?Musculoskeletal:  Positive for back pain. Negative for arthralgias, joint swelling and myalgias.  ?Skin:  Negative for pallor and rash.  ?Neurological:  Negative for dizziness, tremors, weakness, numbness and headaches.  ?Hematological:  Negative for adenopathy. Does not bruise/bleed easily.  ?Psychiatric/Behavioral:  Positive for decreased concentration and dysphoric mood. Negative for confusion, self-injury and suicidal ideas. The patient is nervous/anxious.   ? ?   ?Objective:  ? Physical Exam ?Constitutional:   ?   General: He is not in acute distress. ?   Appearance: Normal appearance. He is well-developed and normal weight. He is not ill-appearing or diaphoretic.  ?HENT:  ?   Head: Normocephalic and atraumatic.  ?   Right Ear: Tympanic membrane, ear canal and external ear normal. There is impacted cerumen.  ?   Left Ear: Tympanic membrane, ear canal and external ear normal. There is impacted cerumen.  ?   Ears:  ?   Comments: Bilateral dry cerumen impaction  ? ?Ceruminosis is noted.  Wax is removed by syringing and manual debridement. Instructions for home care to prevent wax buildup are given. ? ? ?S/p irritation, much improved ?Nl appearing canals with nl TMs ? ?   Mouth/Throat:  ?    Mouth: Mucous membranes are moist.  ?Eyes:  ?   General: No scleral icterus. ?   Conjunctiva/sclera: Conjunctivae normal.  ?   Pupils: Pupils are equal, round, and reactive to light.  ?Neck:  ?   Thyroid: No thyromegaly.  ?   Vascular: No carotid bruit or JVD.  ?Cardiovascular:  ?   Rate and Rhythm: Normal rate and regular rhythm.  ?   Heart sounds: Normal heart sounds.  ?  No gallop.  ?Pulmonary:  ?  Effort: Pulmonary effort is normal. No respiratory distress.  ?   Breath sounds: Normal breath sounds. No wheezing or rales.  ?Abdominal:  ?   General: There is no distension or abdominal bruit.  ?   Palpations: Abdomen is soft.  ?Musculoskeletal:  ?   Cervical back: Normal range of motion and neck supple.  ?   Right lower leg: No edema.  ?   Left lower leg: No edema.  ?Lymphadenopathy:  ?   Cervical: No cervical adenopathy.  ?Skin: ?   General: Skin is warm and dry.  ?   Coloration: Skin is not pale.  ?   Findings: No rash.  ?Neurological:  ?   Mental Status: He is alert.  ?   Coordination: Coordination normal.  ?   Deep Tendon Reflexes: Reflexes are normal and symmetric. Reflexes normal.  ?Psychiatric:     ?   Attention and Perception: He is inattentive.     ?   Mood and Affect: Mood is anxious and depressed. Affect is not blunt, flat or tearful.     ?   Behavior: Behavior normal.     ?   Cognition and Memory: Cognition and memory normal.  ?   Comments: Speech is rapid at times  ? ?Candidly discusses symptoms and stressors  ? ? ?  ? ? ? ? ? ?   ?Assessment & Plan:  ? ?Problem List Items Addressed This Visit   ? ?  ? Nervous and Auditory  ? Cerumen impaction  ?  Noted at November visit, pt has used debrox since to loosen it  ?After informed consent both ears treated with simple irrigation per office protocol  ?He tolerated this well  ?inst to update [place] if any pain or new ear symptoms  ?Normal appearing TM s/p treatment  ? ?  ?  ?  ? Other  ? Attention deficit disorder  ?  Pt is apprehensive to take his metadate ER  20 mg due to rebound hunger at the end of the day  ?Encouraged him to try it again  ?I feel that control of ADD may ultimately help anxiety since he will not feel as overwhelmed ? ?Encouraged good [place]

## 2021-11-19 NOTE — Assessment & Plan Note (Addendum)
Worse lately -anxious and irritable ?Wife is here to help with history  ?Does not take ADD medicine -but admits it would help feel less overwhelmed  ?Reviewed stressors/ coping techniques/symptoms/ support sources/ tx options and side effects in detail today ?Agreeable to try zoloft 25 mg daily  ?Discussed expectations of SSRI medication including time to effectiveness and mechanism of action, also poss of side effects (early and late)- including mental fuzziness, weight or appetite change, nausea and poss of worse dep or anxiety (even suicidal thoughts)  Pt voiced understanding and will stop med and update if this occurs  ?Will update ?F/u in 1-2 mo  ?Enc good self care and exercise also  ?Recommended yoga (he likes) ?Also trial of meditation apps (Calm or headspace) ?

## 2021-11-19 NOTE — Assessment & Plan Note (Signed)
Noted at November visit, pt has used debrox since to loosen it  ?After informed consent both ears treated with simple irrigation per office protocol  ?He tolerated this well  ?inst to update [place] if any pain or new ear symptoms  ?Normal appearing TM s/p treatment  ?

## 2021-11-19 NOTE — Patient Instructions (Addendum)
I would like to refer you for counseling  ?You will get a call/let's see who is available  ? ?Start back on zoloft 25 mg daily (take in the evening)  ?Try and take your ADD medicine also  ? ? ? ?Regular exercise is helpful for mood and physical health  ?Keep doing yoga  ? ?I like the meditation apps - very helpful (Calm and Headspace)  ?Check them out as well  ? ? ? ? ? ? ? ?

## 2021-11-19 NOTE — Assessment & Plan Note (Signed)
Pt is apprehensive to take his metadate ER 20 mg due to rebound hunger at the end of the day  ?Encouraged him to try it again  ?I feel that control of ADD may ultimately help anxiety since he will not feel as overwhelmed ? ?Encouraged good organization / breaks and self care as well  ?

## 2021-11-29 ENCOUNTER — Telehealth: Payer: Self-pay | Admitting: Family Medicine

## 2021-11-29 MED ORDER — METHYLPHENIDATE HCL ER 20 MG PO TBCR: 20.0000 mg | EXTENDED_RELEASE_TABLET | Freq: Every day | ORAL | 0 refills | Status: DC | PRN

## 2021-11-29 NOTE — Telephone Encounter (Signed)
Caller Name: Elton Heid ?Call back phone #: [phone] ? ?MEDICATION(S): methylphenidate (METADATE ER) 20 MG ER tablet ? ? ?Days of Med Remaining: 2 pills left ? ?Has the patient contacted their pharmacy (YES/NO)?  No, controlled medication ?IF YES, when and what did the pharmacy advise?  ?IF NO, request that the patient contact the pharmacy for the refills in the future.  ?           The pharmacy will send an electronic request (except for controlled medications). ? ?[facility]: [facility] [address] ? ?~~~Please advise patient/caregiver to allow 2-3 business days to process RX refills.  ?

## 2021-11-29 NOTE — Telephone Encounter (Addendum)
Name of Medication: methylphenidate 20 mg. ?Name of Pharmacy:[facility] [address]  ?Last Fill or Written Date and Quantity: 12/12/20 #30 tab/ 0 refill ?Last Office Visit and Type: 05/28/21 CPE ?Next Office Visit and Type: 01/28/22 f/u ?

## 2021-12-10 ENCOUNTER — Encounter: Payer: Self-pay | Admitting: Internal Medicine

## 2022-01-16 ENCOUNTER — Other Ambulatory Visit: Payer: Self-pay | Admitting: Family Medicine

## 2022-01-16 NOTE — Telephone Encounter (Signed)
Last filled on 11/19/21 Last ov 11/19/21 Next ov 01/28/22

## 2022-01-28 ENCOUNTER — Encounter: Payer: Self-pay | Admitting: Family Medicine

## 2022-01-28 ENCOUNTER — Ambulatory Visit: Payer: BC Managed Care – PPO | Admitting: Family Medicine

## 2022-01-28 VITALS — BP 124/76 | HR 60 | Temp 97.6°F | Ht 66.5 in | Wt 180.4 lb

## 2022-01-28 DIAGNOSIS — F418 Other specified anxiety disorders: Secondary | ICD-10-CM | POA: Diagnosis not present

## 2022-01-28 DIAGNOSIS — F988 Other specified behavioral and emotional disorders with onset usually occurring in childhood and adolescence: Secondary | ICD-10-CM

## 2022-01-28 MED ORDER — SERTRALINE HCL 50 MG PO TABS: 50.0000 mg | ORAL_TABLET | Freq: Every day | ORAL | 3 refills | Status: DC

## 2022-01-28 NOTE — Assessment & Plan Note (Signed)
Much improved so far with zoloft 25 mg daily  Interested in inc dose to 50 mg  No side eff Reviewed stressors/ coping techniques/symptoms/ support sources/ tx options and side effects in detail today Is more relaxed Attention is better with this and metadate Encouraged self care  Also encouraged to start a yoga practice Wife is here today further encouraging him

## 2022-01-28 NOTE — Progress Notes (Signed)
Subjective:    Patient ID: Benjamin Bullock, male    DOB: [birth year], 71 y.o.   MRN: [id number]  HPI Pt presents for f/u of depression /anxiety with ADD  Wt Readings from Last 3 Encounters:  01/28/22 180 lb 6 oz (81.8 kg)  11/19/21 182 lb 6.4 oz (82.7 kg)  05/28/21 176 lb (79.8 kg)   28.68 kg/m  Last visit we started zoloft 25 mg daily  Had been on this remotely in the past  Recommended regular exercise /yoga  Also encouraged to start back on metadate ER 20 mg for ADD  Wife thinks he is doing much better with anxiety  Wants to go up on dose   He notices he is not fixated on things and less worried  A little lingering down feeling but overall feels like he was taken out of a hole   The metadate really helps Much less distracted   Has not started to do any yoga  Typically walks   Patient Active Problem List   Diagnosis Date Noted   Elevated BP without diagnosis of hypertension 11/19/2021   Encounter for hepatitis C screening test for low risk patient 05/28/2021   Cerumen impaction 05/28/2021   Vitamin D deficiency 05/28/2021   Diverticulosis 05/19/2020   Lower abdominal pain 08/18/2018   Prediabetes 05/22/2017   B12 deficiency 04/22/2017   Current use of proton pump inhibitor 04/15/2016   Anxiety 08/20/2013   Allergic drug reaction 01/29/2012   Back pain 02/05/2011   Routine general medical examination at a health care facility 11/08/2010   Prostate cancer screening 11/08/2010   GERD 09/20/2009   ANXIETY STATE NOS 01/08/2007   Attention deficit disorder 01/08/2007   Hyperlipidemia 01/07/2007   ERECTILE DYSFUNCTION 01/07/2007   Depression with anxiety 01/07/2007   Allergic rhinitis 01/07/2007   Past Medical History:  Diagnosis Date   ADD (attention deficit disorder)    Allergy    allergic rhinitis   Anxiety    Bursitis of hip, right    Depression    GERD (gastroesophageal reflux disease)    History of meniscal tear    left knee meniscal tear    Hyperlipidemia    No past surgical history on file. Social History   Tobacco Use   Smoking status: Former    Types: Cigarettes    Quit date: 1978    Years since quitting: 45.5   Smokeless tobacco: Never   Tobacco comments:    Smoked for about one year 30 years ago  Vaping Use   Vaping Use: Never used  Substance Use Topics   Alcohol use: Yes    Alcohol/week: 0.0 standard drinks of alcohol    Comment: occ   Drug use: Never   Family History  Problem Relation Age of Onset   Heart disease Father        CAD   Hypertension Mother    Depression Mother    Heart disease Paternal Uncle        CAD   COPD Maternal Grandfather    Colon cancer Neg Hx    Colon polyps Neg Hx    Rectal cancer Neg Hx    Stomach cancer Neg Hx    Esophageal cancer Neg Hx    Allergies  Allergen Reactions   Aleve [Naproxen Sodium] Swelling    Face and lips swollen and hives   Omeprazole     REACTION: not effective   Current Outpatient Medications on File Prior to Visit  Medication Sig  Dispense Refill   methylphenidate (METADATE ER) 20 MG ER tablet Take 1 tablet (20 mg total) by mouth daily as needed. 30 tablet 0   pantoprazole (PROTONIX) 40 MG tablet TAKE 1 TABLET BY MOUTH TWICE A DAY 180 tablet 2   rosuvastatin (CRESTOR) 40 MG tablet Take 1 tablet (40 mg total) by mouth daily. 90 tablet 3   sildenafil (REVATIO) 20 MG tablet TAKE 3 TABLETS BY MOUTH 30 MINUTES BEFORE SEXUAL ACTIVITY AS NEEDED 30 tablet 5   baclofen (LIORESAL) 10 MG tablet Take 1 tablet (10 mg total) by mouth daily as needed for muscle spasms. TAKE 1 TABLET BY MOUTH TWICE A DAY AS NEEDED CAN MAKE YOU DROWSY (Patient not taking: Reported on 11/19/2021) 30 each 1   No current facility-administered medications on file prior to visit.      Review of Systems  Constitutional:  Negative for activity change, appetite change, fatigue, fever and unexpected weight change.  HENT:  Negative for congestion, rhinorrhea, sore throat and trouble  swallowing.   Eyes:  Negative for pain, redness, itching and visual disturbance.  Respiratory:  Negative for cough, chest tightness, shortness of breath and wheezing.   Cardiovascular:  Negative for chest pain and palpitations.  Gastrointestinal:  Negative for abdominal pain, blood in stool, constipation, diarrhea and nausea.  Endocrine: Negative for cold intolerance, heat intolerance, polydipsia and polyuria.  Genitourinary:  Negative for difficulty urinating, dysuria, frequency and urgency.  Musculoskeletal:  Negative for arthralgias, joint swelling and myalgias.  Skin:  Negative for pallor and rash.  Neurological:  Negative for dizziness, tremors, weakness, numbness and headaches.  Hematological:  Negative for adenopathy. Does not bruise/bleed easily.  Psychiatric/Behavioral:  Negative for decreased concentration, dysphoric mood and sleep disturbance. The patient is nervous/anxious.        Objective:   Physical Exam Constitutional:      General: He is not in acute distress.    Appearance: Normal appearance. He is well-developed and normal weight. He is not ill-appearing or diaphoretic.  HENT:     Head: Normocephalic and atraumatic.  Eyes:     Conjunctiva/sclera: Conjunctivae normal.     Pupils: Pupils are equal, round, and reactive to light.  Neck:     Thyroid: No thyromegaly.     Vascular: No carotid bruit or JVD.  Cardiovascular:     Rate and Rhythm: Normal rate and regular rhythm.     Heart sounds: Normal heart sounds.     No gallop.  Pulmonary:     Effort: Pulmonary effort is normal. No respiratory distress.     Breath sounds: Normal breath sounds. No wheezing or rales.  Abdominal:     General: There is no distension or abdominal bruit.     Palpations: Abdomen is soft.  Musculoskeletal:     Cervical back: Normal range of motion and neck supple.     Right lower leg: No edema.     Left lower leg: No edema.  Lymphadenopathy:     Cervical: No cervical adenopathy.   Skin:    General: Skin is warm and dry.     Coloration: Skin is not pale.     Findings: No rash.  Neurological:     Mental Status: He is alert.     Motor: No weakness, tremor or abnormal muscle tone.     Coordination: Coordination normal.     Deep Tendon Reflexes: Reflexes are normal and symmetric. Reflexes normal.  Psychiatric:        Attention and Perception:  Attention normal.        Mood and Affect: Mood normal. Mood is not depressed. Affect is not labile or flat.        Speech: Speech normal.        Behavior: Behavior normal.        Cognition and Memory: Cognition and memory normal.     Comments: Less anxious and more attentive  Notable improvement   Supportive wife here today           Assessment & Plan:   Problem List Items Addressed This Visit       Other   Attention deficit disorder    Taking the metadate regularly  Doing well  Dislikes the rebound hunger but no weight gain noted Encouraged him to continue it      Depression with anxiety - Primary    Much improved so far with zoloft 25 mg daily  Interested in inc dose to 50 mg  No side eff Reviewed stressors/ coping techniques/symptoms/ support sources/ tx options and side effects in detail today Is more relaxed Attention is better with this and metadate Encouraged self care  Also encouraged to start a yoga practice Wife is here today further encouraging him      Relevant Medications   sertraline (ZOLOFT) 50 MG tablet

## 2022-01-28 NOTE — Assessment & Plan Note (Signed)
Taking the metadate regularly  Doing well  Dislikes the rebound hunger but no weight gain noted Encouraged him to continue it

## 2022-01-28 NOTE — Patient Instructions (Addendum)
Go up on zoloft to 50 mg daily  If any intolerable side effects or if you feel worse cut back to 25 mg daily   Take care of yourself   Continue the ADD medicine also   Follow up for your annual exam in early November as planned

## 2022-02-12 IMAGING — MR MR SHOULDER*R* W/O CM
4 of 5 series · 21 of 40 positions shown · non-contrast
Comparison: None.

CLINICAL DATA: Right shoulder pain with painful range of motion. No
known injury.

EXAM:
MRI OF THE RIGHT SHOULDER WITHOUT CONTRAST
TECHNIQUE: Multiplanar, multisequence MR imaging of the shoulder was performed.
No intravenous contrast was administered.

[Series 6: PD fat-sat · axial · right · 4.0mm · 0.44mm/px · z∈[-75,+45]mm · 8 of 26 slices shown (1 of 2)]
[im 1/26]
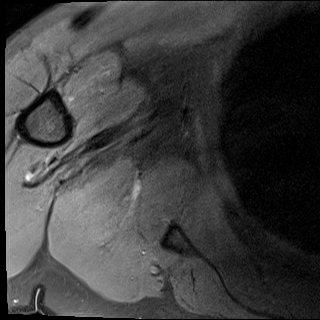
[im 4/26]
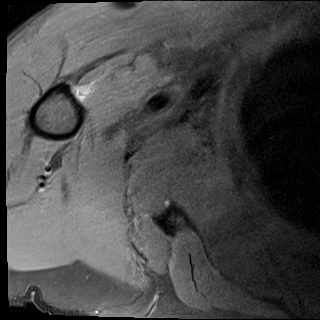
[im 8/26]
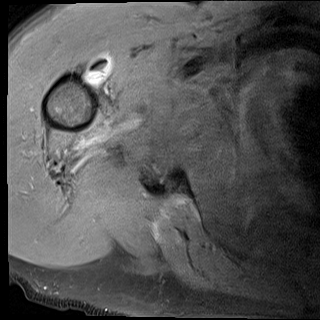
[im 11/26]
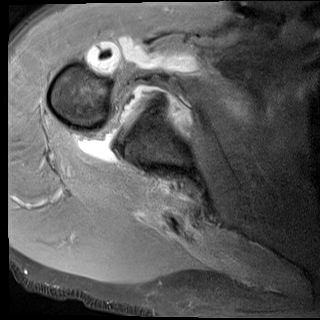
[im 15/26]
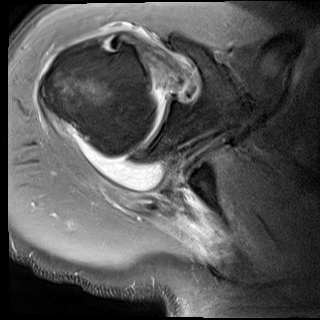
[im 18/26]
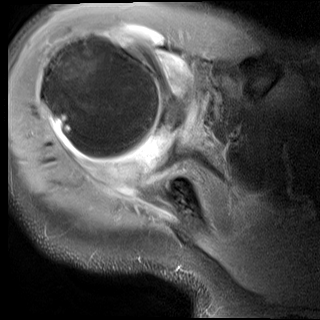
[im 22/26]
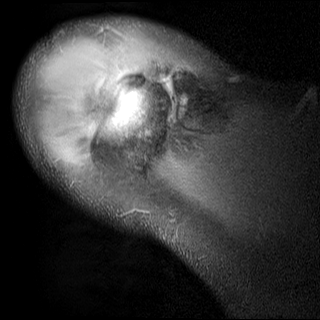
[im 26/26]
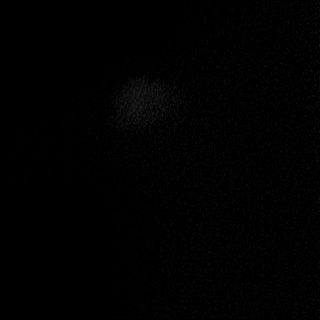

[Series 8: T2 fat-sat · sagittal · right · 4.0mm · 0.44mm/px · 3 of 29 slices shown (1 of 2)]
[im 4/29]
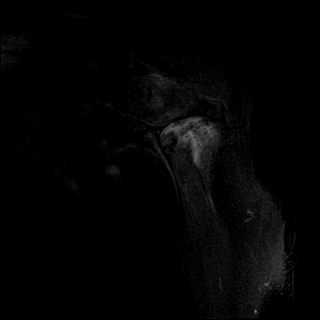
[im 15/29]
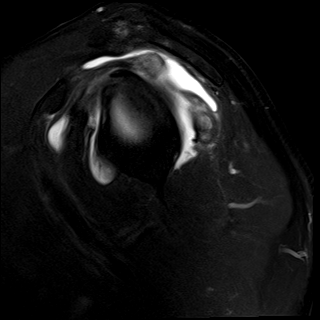
[im 25/29]
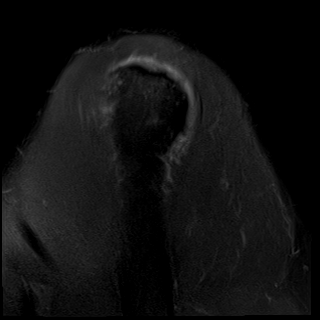

[Series 9: PD fat-sat · oblique · right · 4.0mm · 0.22mm/px · 7 of 24 slices shown (2 of 2)]
[im 1/24]
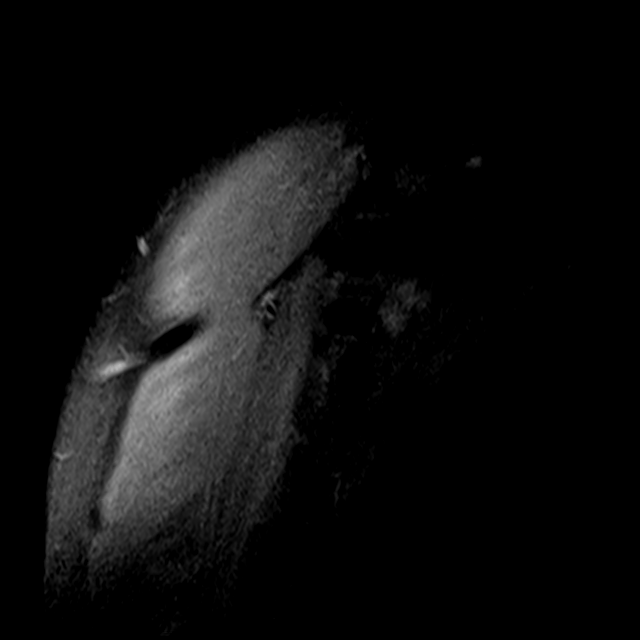
[im 4/24]
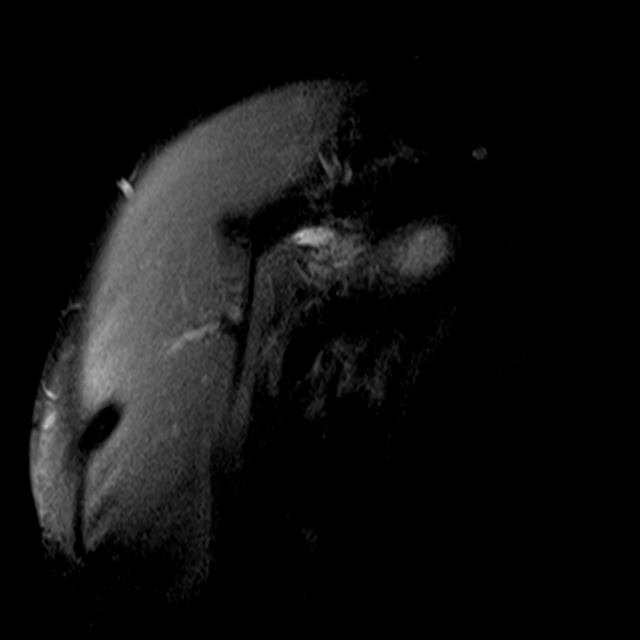
[im 8/24]
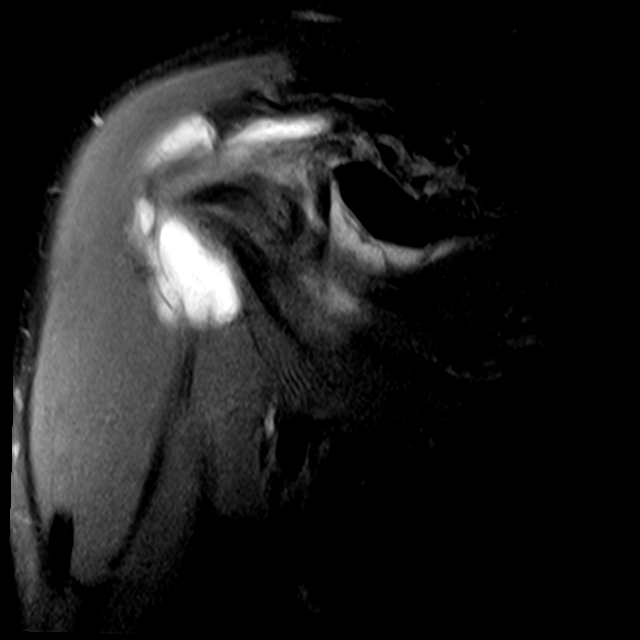
[im 12/24]
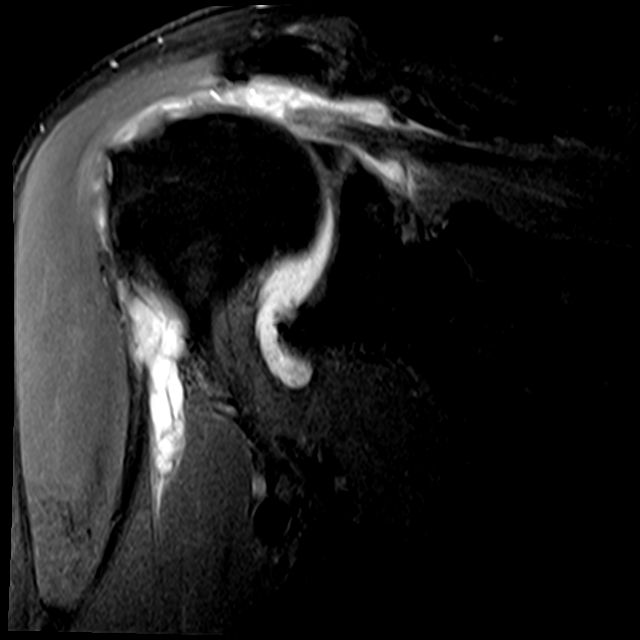
[im 16/24]
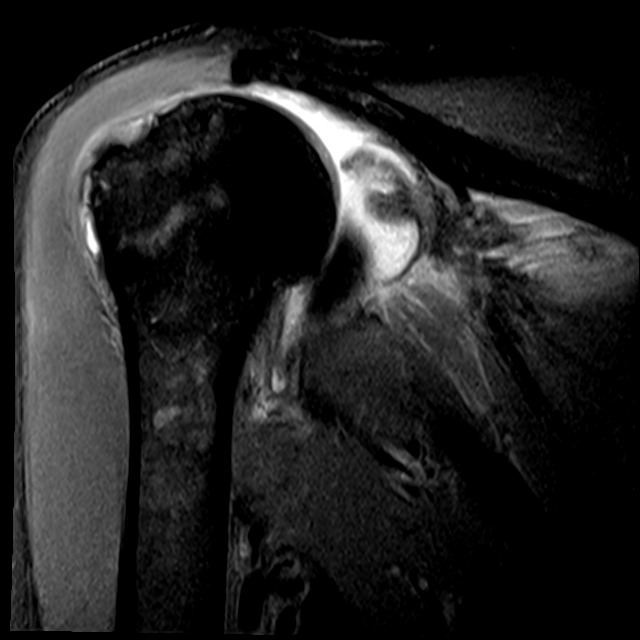
[im 20/24]
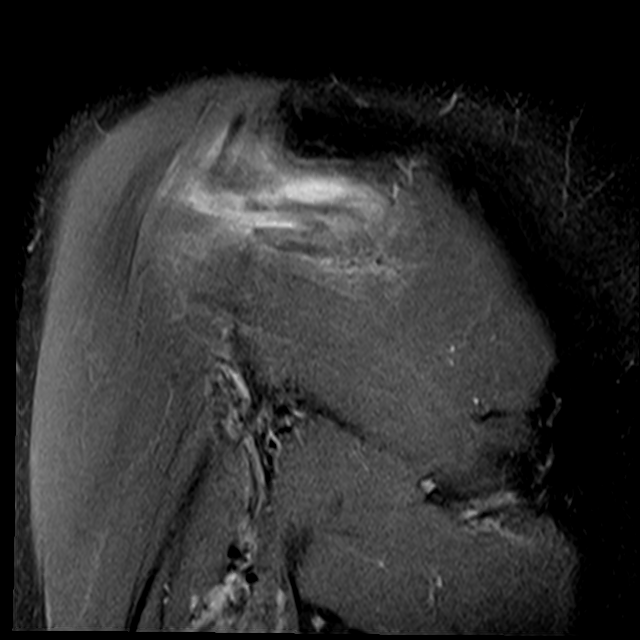
[im 24/24]
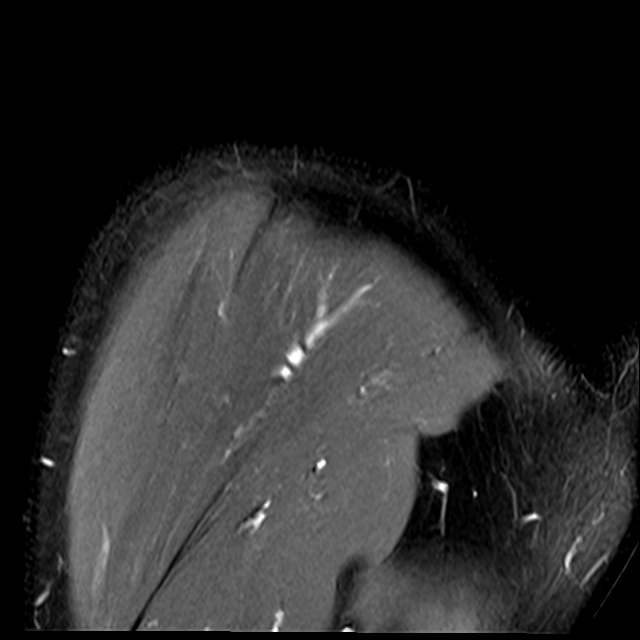

[Series 11: T2 fat-sat · oblique · right · 4.0mm · 0.22mm/px · 3 of 24 slices shown (2 of 2)]
[im 4/24]
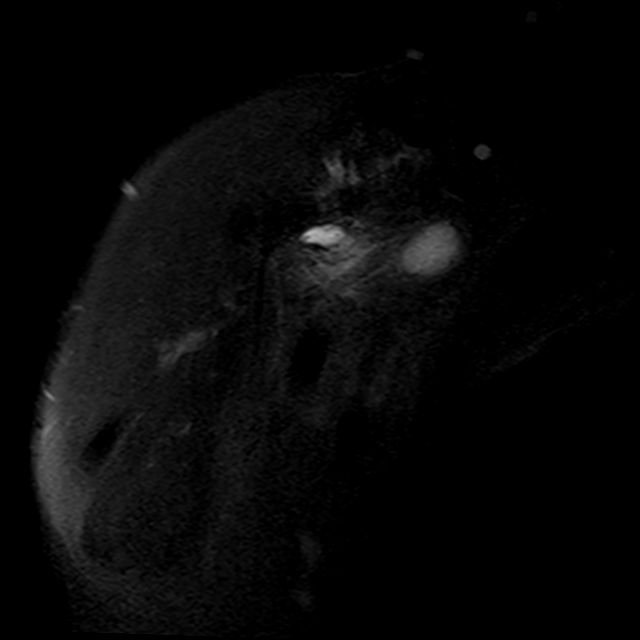
[im 12/24]
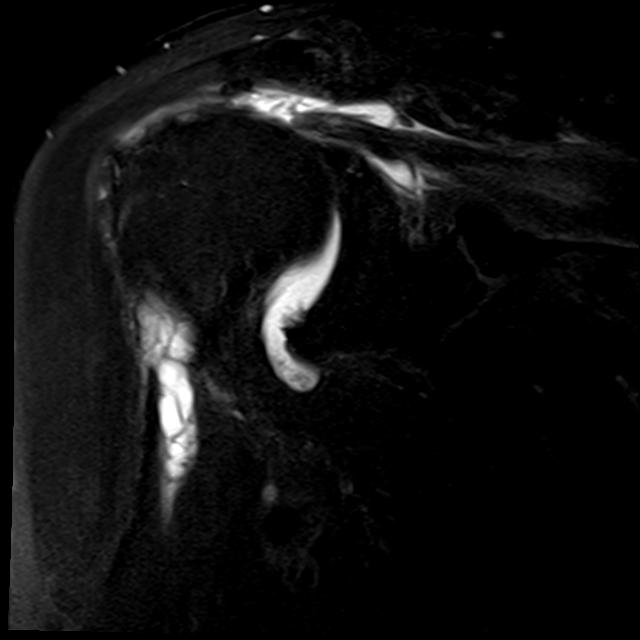
[im 20/24]
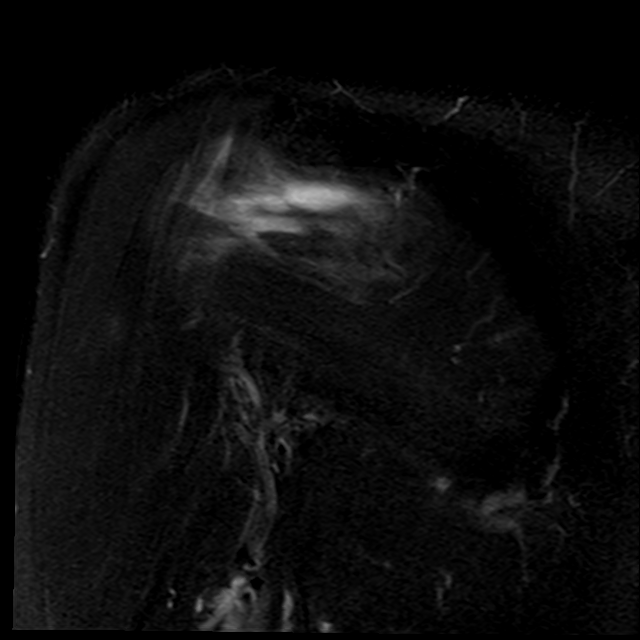

[21 of 40 positions shown; findings below may reference images not displayed]

FINDINGS: Rotator cuff: There are large full-thickness full width retracted
tears of the infraspinatus and supraspinatus tendons. Subscapularis
and teres minor tendons are intact.

Muscles: There is slight atrophy of the supraspinatus and
infraspinatus muscles. There is prominent edema in the infraspinatus
muscle and slight edema in the supraspinatus muscle.

Biceps long head:  Properly located and intact.

Acromioclavicular Joint: Minimal AC joint arthropathy. Type 2
acromion.

Glenohumeral Joint: Prominent glenohumeral joint effusion. Slight
thinning of the articular cartilage of the humeral head. Small
marginal osteophyte on the inferior aspect of the humeral head. The
humeral head is superiorly subluxed with respect of the glenoid due
to the rotator cuff tears.

Labrum:  No discrete tear.  Fraying of the posterior labrum.

Bones: Minimal degenerative changes of the greater tuberosity of the
proximal humerus.

Other: None
IMPRESSION: 1. Large full-thickness full width retracted tears of the
infraspinatus and supraspinatus tendons with slight atrophy of those
muscles.
2. Prominent glenohumeral joint effusion.

## 2022-03-27 ENCOUNTER — Other Ambulatory Visit: Payer: Self-pay | Admitting: Family Medicine

## 2022-03-28 ENCOUNTER — Telehealth: Payer: Self-pay | Admitting: Family Medicine

## 2022-03-28 MED ORDER — PANTOPRAZOLE SODIUM 40 MG PO TBEC: 40.0000 mg | DELAYED_RELEASE_TABLET | Freq: Two times a day (BID) | ORAL | 0 refills | Status: DC

## 2022-03-28 NOTE — Telephone Encounter (Signed)
  Encourage patient to contact the pharmacy for refills or they can request refills through [facility]  Did the patient contact the pharmacy: No  LAST APPOINTMENT DATE: 01/28/2022  NEXT APPOINTMENT DATE: 05/30/2022  MEDICATION: pantoprazole (PROTONIX) 40 MG tablet  Is the patient out of medication? Yes  PHARMACY: [id number] [place], [place] [address] [place]: Patient lost his last bottle of pills and need a refill.   Let patient know to contact pharmacy at the end of the day to make sure medication is ready.  Please notify patient to allow 48-72 hours to process

## 2022-05-22 ENCOUNTER — Telehealth: Payer: Self-pay | Admitting: Family Medicine

## 2022-05-22 DIAGNOSIS — E538 Deficiency of other specified B group vitamins: Secondary | ICD-10-CM

## 2022-05-22 DIAGNOSIS — Z125 Encounter for screening for malignant neoplasm of prostate: Secondary | ICD-10-CM

## 2022-05-22 DIAGNOSIS — Z79899 Other long term (current) drug therapy: Secondary | ICD-10-CM

## 2022-05-22 DIAGNOSIS — E559 Vitamin D deficiency, unspecified: Secondary | ICD-10-CM

## 2022-05-22 DIAGNOSIS — R7303 Prediabetes: Secondary | ICD-10-CM

## 2022-05-22 DIAGNOSIS — E7800 Pure hypercholesterolemia, unspecified: Secondary | ICD-10-CM

## 2022-05-22 DIAGNOSIS — Z0000 Encounter for general adult medical examination without abnormal findings: Secondary | ICD-10-CM

## 2022-05-22 NOTE — Telephone Encounter (Signed)
-----   Message from Ellamae Sia sent at 05/09/2022 10:08 AM EDT ----- Regarding: Lab orders for Thursday, 11.2.23 Patient is scheduled for CPX labs, please order future labs, Thanks , Karna Christmas

## 2022-05-23 ENCOUNTER — Other Ambulatory Visit (INDEPENDENT_AMBULATORY_CARE_PROVIDER_SITE_OTHER): Payer: BC Managed Care – PPO

## 2022-05-23 DIAGNOSIS — Z125 Encounter for screening for malignant neoplasm of prostate: Secondary | ICD-10-CM

## 2022-05-23 DIAGNOSIS — Z0000 Encounter for general adult medical examination without abnormal findings: Secondary | ICD-10-CM

## 2022-05-23 DIAGNOSIS — E559 Vitamin D deficiency, unspecified: Secondary | ICD-10-CM | POA: Diagnosis not present

## 2022-05-23 DIAGNOSIS — E7800 Pure hypercholesterolemia, unspecified: Secondary | ICD-10-CM

## 2022-05-23 DIAGNOSIS — E538 Deficiency of other specified B group vitamins: Secondary | ICD-10-CM | POA: Diagnosis not present

## 2022-05-23 DIAGNOSIS — R7303 Prediabetes: Secondary | ICD-10-CM

## 2022-05-23 LAB — COMPREHENSIVE METABOLIC PANEL
ALT: 21 U/L (ref 0–53)
AST: 19 U/L (ref 0–37)
Albumin: 4.4 g/dL (ref 3.5–5.2)
Alkaline Phosphatase: 39 U/L (ref 39–117)
BUN: 14 mg/dL (ref 6–23)
CO2: 31 mEq/L (ref 19–32)
Calcium: 9 mg/dL (ref 8.4–10.5)
Chloride: 103 mEq/L (ref 96–112)
Creatinine, Ser: 0.84 mg/dL (ref 0.40–1.50)
GFR: 86.34 mL/min (ref >60.00)
Glucose, Bld: 98 mg/dL (ref 70–99)
Potassium: 4.4 mEq/L (ref 3.5–5.1)
Sodium: 140 mEq/L (ref 135–145)
Total Bilirubin: 0.7 mg/dL (ref 0.2–1.2)
Total Protein: 6.4 g/dL (ref 6.0–8.3)

## 2022-05-23 LAB — LIPID PANEL
Cholesterol: 165 mg/dL (ref 0–200)
HDL: 54.9 mg/dL (ref >39.00)
LDL Cholesterol: 91 mg/dL (ref 0–99)
NonHDL: 109.83
Total CHOL/HDL Ratio: 3
Triglycerides: 92 mg/dL (ref 0.0–149.0)
VLDL: 18.4 mg/dL (ref 0.0–40.0)

## 2022-05-23 LAB — CBC WITH DIFFERENTIAL/PLATELET
Basophils Absolute: 0.1 10*3/uL (ref 0.0–0.1)
Basophils Relative: 1.5 % (ref 0.0–3.0)
Eosinophils Absolute: 0.1 10*3/uL (ref 0.0–0.7)
Eosinophils Relative: 1.6 % (ref 0.0–5.0)
HCT: 42.9 % (ref 39.0–52.0)
Hemoglobin: 14.3 g/dL (ref 13.0–17.0)
Lymphocytes Relative: 30.4 % (ref 12.0–46.0)
Lymphs Abs: 1.2 10*3/uL (ref 0.7–4.0)
MCHC: 33.2 g/dL (ref 30.0–36.0)
MCV: 91.9 fl (ref 78.0–100.0)
Monocytes Absolute: 0.4 10*3/uL (ref 0.1–1.0)
Monocytes Relative: 9.1 % (ref 3.0–12.0)
Neutro Abs: 2.2 10*3/uL (ref 1.4–7.7)
Neutrophils Relative %: 57.4 % (ref 43.0–77.0)
Platelets: 166 10*3/uL (ref 150.0–400.0)
RBC: 4.67 Mil/uL (ref 4.22–5.81)
RDW: 14.2 % (ref 11.5–15.5)
WBC: 3.9 10*3/uL — ABNORMAL LOW (ref 4.0–10.5)

## 2022-05-23 LAB — PSA: PSA: 1.25 ng/mL (ref 0.10–4.00)

## 2022-05-23 LAB — HEMOGLOBIN A1C: Hgb A1c MFr Bld: 6.3 % (ref 4.6–6.5)

## 2022-05-23 LAB — VITAMIN B12: Vitamin B-12: 444 pg/mL (ref 211–911)

## 2022-05-23 LAB — VITAMIN D 25 HYDROXY (VIT D DEFICIENCY, FRACTURES): VITD: 25.65 ng/mL — ABNORMAL LOW (ref 30.00–100.00)

## 2022-05-23 LAB — TSH: TSH: 2.63 u[IU]/mL (ref 0.35–5.50)

## 2022-05-30 ENCOUNTER — Encounter: Payer: BC Managed Care – PPO | Admitting: Family Medicine

## 2022-05-30 ENCOUNTER — Ambulatory Visit (INDEPENDENT_AMBULATORY_CARE_PROVIDER_SITE_OTHER): Payer: BC Managed Care – PPO | Admitting: Family Medicine

## 2022-05-30 ENCOUNTER — Encounter: Payer: Self-pay | Admitting: Family Medicine

## 2022-05-30 VITALS — BP 128/70 | HR 54 | Temp 97.9°F | Ht 66.25 in | Wt 175.5 lb

## 2022-05-30 DIAGNOSIS — Z23 Encounter for immunization: Secondary | ICD-10-CM

## 2022-05-30 DIAGNOSIS — Z0000 Encounter for general adult medical examination without abnormal findings: Secondary | ICD-10-CM

## 2022-05-30 DIAGNOSIS — Z1211 Encounter for screening for malignant neoplasm of colon: Secondary | ICD-10-CM

## 2022-05-30 DIAGNOSIS — K219 Gastro-esophageal reflux disease without esophagitis: Secondary | ICD-10-CM

## 2022-05-30 DIAGNOSIS — R7303 Prediabetes: Secondary | ICD-10-CM

## 2022-05-30 DIAGNOSIS — F418 Other specified anxiety disorders: Secondary | ICD-10-CM

## 2022-05-30 DIAGNOSIS — E538 Deficiency of other specified B group vitamins: Secondary | ICD-10-CM | POA: Diagnosis not present

## 2022-05-30 DIAGNOSIS — F988 Other specified behavioral and emotional disorders with onset usually occurring in childhood and adolescence: Secondary | ICD-10-CM

## 2022-05-30 DIAGNOSIS — Z125 Encounter for screening for malignant neoplasm of prostate: Secondary | ICD-10-CM

## 2022-05-30 DIAGNOSIS — Z79899 Other long term (current) drug therapy: Secondary | ICD-10-CM

## 2022-05-30 DIAGNOSIS — E7800 Pure hypercholesterolemia, unspecified: Secondary | ICD-10-CM

## 2022-05-30 DIAGNOSIS — E559 Vitamin D deficiency, unspecified: Secondary | ICD-10-CM

## 2022-05-30 MED ORDER — METHYLPHENIDATE HCL ER 20 MG PO TBCR: 20.0000 mg | EXTENDED_RELEASE_TABLET | Freq: Every day | ORAL | 0 refills | Status: DC | PRN

## 2022-05-30 NOTE — Assessment & Plan Note (Signed)
Reviewed health habits including diet and exercise and skin cancer prevention Reviewed appropriate screening tests for age  Also reviewed health mt list, fam hx and immunization status , as well as social and family history   See HPI Labs reviewed  Tetanus vaccine given Pt plans to look into shingrix at pharmacy  Colonoscopy due in march, ref done  Psa is stable  Mood is better

## 2022-05-30 NOTE — Assessment & Plan Note (Signed)
Level low at 25.65  Is on ppi  Disc imp for bone and overall health

## 2022-05-30 NOTE — Assessment & Plan Note (Signed)
Lab Results  Component Value Date   [id number] 150 05/23/2022   D is low Enc to get back on D3 2000 iu daily

## 2022-05-30 NOTE — Assessment & Plan Note (Signed)
Lab Results  Component Value Date   HGBA1C 6.3 05/23/2022    disc imp of low glycemic diet and wt loss to prevent DM2  Enc to add some more exercise/ strength training

## 2022-05-30 NOTE — Assessment & Plan Note (Signed)
Much improved with sertraline 50 mg daily  Keeping organized  Tolerating well Reviewed stressors/ coping techniques/symptoms/ support sources/ tx options and side effects in detail today  Can tell a difference today in exam/communication

## 2022-05-30 NOTE — Assessment & Plan Note (Signed)
Lab Results  Component Value Date   PSA 1.25 05/23/2022   PSA 1.76 05/28/2021   PSA 1.51 05/16/2020    No change in nocturia  Pt does not desire tx for BPH symptoms currently

## 2022-05-30 NOTE — Assessment & Plan Note (Signed)
Takes metadate prn when really needed  Pt dislikes the rebound appetite with this  It does work however  Refilled today

## 2022-05-30 NOTE — Patient Instructions (Addendum)
Think about adding some strength training ! And keep walking  Silver sneakers programs are great   If you are interested in the shingles vaccine series (Shingrix), call your insurance or pharmacy to check on coverage and location it must be given.  If affordable - you can schedule it here or at your pharmacy depending on coverage    I think your colonoscopy is due in march Call the GI office to get that set    [facility]  [phone]  If you are not taking it start vitamin D3 2000 iu daily   Tetanus shot today

## 2022-05-30 NOTE — Assessment & Plan Note (Signed)
Due for a colonoscopy in march for 3 y recall Ref done Pt will call for appt

## 2022-05-30 NOTE — Assessment & Plan Note (Signed)
Disc goals for lipids and reasons to control them Rev last labs with pt Rev low sat fat diet in detail Crestor 40 mg daily -tolerating well  LDL of 91

## 2022-05-30 NOTE — Assessment & Plan Note (Signed)
Takes protonix 40 mg bid  Enc pt to consider wean to once daily (he will think about it)-would expect some rebound symptoms temporarily  Watching diet  Has lost 5 lb

## 2022-05-30 NOTE — Progress Notes (Signed)
Subjective:    Patient ID: Benjamin Bullock, male    DOB: [birth year], 71 y.o.   MRN: [id number]  HPI Here for health maintenance exam and to review chronic medical problems    Wt Readings from Last 3 Encounters:  05/30/22 175 lb 8 oz (79.6 kg)  01/28/22 180 lb 6 oz (81.8 kg)  11/19/21 182 lb 6.4 oz (82.7 kg)   28.11 kg/m  Feeling good overall  Thinks the sertraline is helping -less anxious   Eating well /healthy  Exercising - walking a lot  Wt is down 5 lb   Drinking less alcohol and feels better     Immunization History  Administered Date(s) Administered   COVID-19, mRNA, vaccine(Comirnaty)12 years and older 05/25/2022   Fluad Quad(high Dose 65+) 05/04/2019, 05/04/2021, 05/25/2022   Influenza Split 03/31/2012   Influenza Whole 06/24/2006, 05/29/2007, 04/19/2008   Influenza, High Dose Seasonal PF 04/21/2020   Influenza,inj,Quad PF,6+ Mos 05/12/2013, 04/04/2014, 04/10/2015, 04/15/2016, 04/22/2017, 05/01/2018   PFIZER Comirnaty(Gray Top)Covid-19 Tri-Sucrose Vaccine 10/21/2020   PFIZER(Purple Top)SARS-COV-2 Vaccination 08/08/2019, 08/28/2019, 04/21/2020   Pfizer Covid-19 Vaccine Bivalent Booster 64yr & up 06/02/2021   Pneumococcal Conjugate-13 04/10/2015   Pneumococcal Polysaccharide-23 04/04/2014   Td 10/05/2004   Tdap 04/17/2011   Zoster, Live 06/16/2013   Health Maintenance Due  Topic Date Due   Zoster Vaccines- Shingrix (1 of 2) Never done   TETANUS/TDAP  04/16/2021   COVID-19 Vaccine (6 - Pfizer risk series) 07/28/2021   COLONOSCOPY (Pts 45-439yrInsurance coverage will need to be confirmed)  09/30/2021   Shingrix: interested   Had covid booster and flu shot   Tdap 2012   due   Colonoscopy 09/2018 with 3 y recall -Dr GeCarlean [facility]urlProstate health Lab Results  Component Value Date   PSA 1.25 05/23/2022   PSA 1.76 05/28/2021   PSA 1.51 05/16/2020   Drinks a lot of fluids  Nocturia 3 times per night  Tolerates this and does not want medication   Mood =  better!  BP Readings from Last 3 Encounters:  05/30/22 128/70  01/28/22 124/76  11/19/21 132/80   Pulse Readings from Last 3 Encounters:  05/30/22 (!) 54  01/28/22 60  11/19/21 83    GERD  Protonix 40 mg bid  Doing fairly well  Avoids cooked tomato and too much chocolate    Lab Results  Component Value Date   VITAMINB12 444 05/23/2022   Vit D level 25.65 Hyperlipidemia Lab Results  Component Value Date   CHOL 165 05/23/2022   CHOL 175 05/28/2021   CHOL 171 05/16/2020   Lab Results  Component Value Date   HDL 54.90 05/23/2022   HDL 60.20 05/28/2021   HDL 57.40 05/16/2020   Lab Results  Component Value Date   LDLCALC 91 05/23/2022   LDLCALC 102 (H) 05/28/2021   LDLCALC 98 05/16/2020   Lab Results  Component Value Date   TRIG 92.0 05/23/2022   TRIG 66.0 05/28/2021   TRIG 78.0 05/16/2020   Lab Results  Component Value Date   CHOLHDL 3 05/23/2022   CHOLHDL 3 05/28/2021   CHOLHDL 3 05/16/2020   Lab Results  Component Value Date   LDLDIRECT 130.9 10/25/2008   LDLDIRECT 141.4 03/16/2007   LDLDIRECT 139.6 09/18/2006   Crestor 40 mg daily   Prediabetes Lab Results  Component Value Date   HGBA1C 6.3 05/23/2022    Patient Active Problem List   Diagnosis Date Noted   Elevated BP without diagnosis of hypertension 11/19/2021  Encounter for hepatitis C screening test for low risk patient 05/28/2021   Vitamin D deficiency 05/28/2021   Diverticulosis 05/19/2020   Lower abdominal pain 08/18/2018   Prediabetes 05/22/2017   B12 deficiency 04/22/2017   Current use of proton pump inhibitor 04/15/2016   Anxiety 08/20/2013   Colon cancer screening 03/31/2012   Allergic drug reaction 01/29/2012   Back pain 02/05/2011   Routine general medical examination at a health care facility 11/08/2010   Prostate cancer screening 11/08/2010   GERD 09/20/2009   ANXIETY STATE NOS 01/08/2007   Attention deficit disorder 01/08/2007   Hyperlipidemia 01/07/2007    ERECTILE DYSFUNCTION 01/07/2007   Depression with anxiety 01/07/2007   Allergic rhinitis 01/07/2007   Past Medical History:  Diagnosis Date   ADD (attention deficit disorder)    Allergy    allergic rhinitis   Anxiety    Bursitis of hip, right    Depression    GERD (gastroesophageal reflux disease)    History of meniscal tear    left knee meniscal tear   Hyperlipidemia    History reviewed. No pertinent surgical history. Social History   Tobacco Use   Smoking status: Former    Types: Cigarettes    Quit date: 1978    Years since quitting: 45.8   Smokeless tobacco: Never   Tobacco comments:    Smoked for about one year 30 years ago  Vaping Use   Vaping Use: Never used  Substance Use Topics   Alcohol use: Yes    Alcohol/week: 0.0 standard drinks of alcohol    Comment: occ   Drug use: Never   Family History  Problem Relation Age of Onset   Heart disease Father        CAD   Hypertension Mother    Depression Mother    Heart disease Paternal Uncle        CAD   COPD Maternal Grandfather    Colon cancer Neg Hx    Colon polyps Neg Hx    Rectal cancer Neg Hx    Stomach cancer Neg Hx    Esophageal cancer Neg Hx    Allergies  Allergen Reactions   Aleve [Naproxen Sodium] Swelling    Face and lips swollen and hives   Omeprazole     REACTION: not effective   Current Outpatient Medications on File Prior to Visit  Medication Sig Dispense Refill   pantoprazole (PROTONIX) 40 MG tablet Take 1 tablet (40 mg total) by mouth 2 (two) times daily. 180 tablet 0   rosuvastatin (CRESTOR) 40 MG tablet TAKE 1 TABLET BY MOUTH EVERY DAY 90 tablet 0   sertraline (ZOLOFT) 50 MG tablet Take 1 tablet (50 mg total) by mouth daily. 90 tablet 3   sildenafil (REVATIO) 20 MG tablet TAKE 3 TABLETS BY MOUTH 30 MINUTES BEFORE SEXUAL ACTIVITY AS NEEDED 30 tablet 5   No current facility-administered medications on file prior to visit.    Review of Systems  Constitutional:  Negative for activity  change, appetite change, fatigue, fever and unexpected weight change.  HENT:  Negative for congestion, rhinorrhea, sore throat and trouble swallowing.   Eyes:  Negative for pain, redness, itching and visual disturbance.  Respiratory:  Negative for cough, chest tightness, shortness of breath and wheezing.   Cardiovascular:  Negative for chest pain and palpitations.  Gastrointestinal:  Negative for abdominal pain, blood in stool, constipation, diarrhea and nausea.  Endocrine: Negative for cold intolerance, heat intolerance, polydipsia and polyuria.  Genitourinary:  Negative  for difficulty urinating, dysuria, frequency and urgency.  Musculoskeletal:  Positive for back pain. Negative for arthralgias, joint swelling and myalgias.  Skin:  Negative for pallor and rash.  Neurological:  Negative for dizziness, tremors, weakness, numbness and headaches.  Hematological:  Negative for adenopathy. Does not bruise/bleed easily.  Psychiatric/Behavioral:  Positive for decreased concentration. Negative for dysphoric mood. The patient is not nervous/anxious.        Mood is better  Concentration is better when he takes his ADHD medication        Objective:   Physical Exam Constitutional:      General: He is not in acute distress.    Appearance: Normal appearance. He is well-developed and normal weight. He is not ill-appearing or diaphoretic.  HENT:     Head: Normocephalic and atraumatic.     Right Ear: Tympanic membrane, ear canal and external ear normal.     Left Ear: Tympanic membrane, ear canal and external ear normal.     Nose: Nose normal. No congestion.     Mouth/Throat:     Mouth: Mucous membranes are moist.     Pharynx: Oropharynx is clear. No posterior oropharyngeal erythema.  Eyes:     General: No scleral icterus.       Right eye: No discharge.        Left eye: No discharge.     Conjunctiva/sclera: Conjunctivae normal.     Pupils: Pupils are equal, round, and reactive to light.  Neck:      Thyroid: No thyromegaly.     Vascular: No carotid bruit or JVD.  Cardiovascular:     Rate and Rhythm: Normal rate and regular rhythm.     Pulses: Normal pulses.     Heart sounds: Normal heart sounds.     No gallop.  Pulmonary:     Effort: Pulmonary effort is normal. No respiratory distress.     Breath sounds: Normal breath sounds. No wheezing or rales.     Comments: Good air exch Chest:     Chest wall: No tenderness.  Abdominal:     General: Bowel sounds are normal. There is no distension or abdominal bruit.     Palpations: Abdomen is soft. There is no mass.     Tenderness: There is no abdominal tenderness.     Hernia: No hernia is present.  Musculoskeletal:        General: No tenderness.     Cervical back: Normal range of motion and neck supple. No rigidity. No muscular tenderness.     Right lower leg: No edema.     Left lower leg: No edema.  Lymphadenopathy:     Cervical: No cervical adenopathy.  Skin:    General: Skin is warm and dry.     Coloration: Skin is not pale.     Findings: No erythema or rash.     Comments: Solar lentigines diffusely   Neurological:     Mental Status: He is alert.     Cranial Nerves: No cranial nerve deficit.     Motor: No abnormal muscle tone.     Coordination: Coordination normal.     Gait: Gait normal.     Deep Tendon Reflexes: Reflexes are normal and symmetric. Reflexes normal.  Psychiatric:        Attention and Perception: Attention normal.        Mood and Affect: Mood normal.        Cognition and Memory: Cognition and memory normal.  Comments: Mood is improved             Assessment & Plan:   Problem List Items Addressed This Visit       Digestive   GERD    Takes protonix 40 mg bid  Enc pt to consider wean to once daily (he will think about it)-would expect some rebound symptoms temporarily  Watching diet  Has lost 5 lb         Other   Attention deficit disorder    Takes metadate prn when really needed  Pt  dislikes the rebound appetite with this  It does work however  Refilled today      B12 deficiency    Lab Results  Component Value Date   IOEVOJJK09 381 05/23/2022  Urged to continue oral B12 suppl      Colon cancer screening    Due for a colonoscopy in march for 3 y recall Ref done Pt will call for appt      Relevant Orders   Ambulatory referral to Gastroenterology   Current use of proton pump inhibitor    Lab Results  Component Value Date   VITAMINB12 444 05/23/2022  D is low Enc to get back on D3 2000 iu daily      Depression with anxiety    Much improved with sertraline 50 mg daily  Keeping organized  Tolerating well Reviewed stressors/ coping techniques/symptoms/ support sources/ tx options and side effects in detail today  Can tell a difference today in exam/communication       Hyperlipidemia    Disc goals for lipids and reasons to control them Rev last labs with pt Rev low sat fat diet in detail Crestor 40 mg daily -tolerating well  LDL of 91        Prediabetes    Lab Results  Component Value Date   HGBA1C 6.3 05/23/2022   disc imp of low glycemic diet and wt loss to prevent DM2  Enc to add some more exercise/ strength training       Prostate cancer screening    Lab Results  Component Value Date   PSA 1.25 05/23/2022   PSA 1.76 05/28/2021   PSA 1.51 05/16/2020   No change in nocturia  Pt does not desire tx for BPH symptoms currently       Routine general medical examination at a health care facility - Primary    Reviewed health habits including diet and exercise and skin cancer prevention Reviewed appropriate screening tests for age  Also reviewed health mt list, fam hx and immunization status , as well as social and family history   See HPI Labs reviewed  Tetanus vaccine given Pt plans to look into shingrix at pharmacy  Colonoscopy due in march, ref done  Psa is stable  Mood is better       Relevant Orders   Td :  Tetanus/diphtheria >7yo Preservative  free (Completed)   Vitamin D deficiency    Level low at 25.65  Is on ppi  Disc imp for bone and overall health       Other Visit Diagnoses     Need for Td vaccine       Relevant Orders   Td : Tetanus/diphtheria >7yo Preservative  free (Completed)

## 2022-05-30 NOTE — Assessment & Plan Note (Signed)
Lab Results  Component Value Date   [id number] 208 05/23/2022   Urged to continue oral B12 suppl

## 2022-06-05 ENCOUNTER — Telehealth: Payer: Self-pay

## 2022-06-05 ENCOUNTER — Other Ambulatory Visit (HOSPITAL_COMMUNITY): Payer: Self-pay

## 2022-06-05 NOTE — Telephone Encounter (Signed)
Pharmacy Patient Advocate Encounter   Received notification from [facility] that prior authorization for Methylphinidate '20mg'$  er is required/requested.   PA submitted on 06/05/22 to [employer] via CoverMyMeds  Key [id number] PA Case ID: [id number]  Status is pending

## 2022-06-06 ENCOUNTER — Telehealth: Payer: Self-pay | Admitting: Family Medicine

## 2022-06-06 ENCOUNTER — Other Ambulatory Visit (HOSPITAL_COMMUNITY): Payer: Self-pay

## 2022-06-06 NOTE — Telephone Encounter (Signed)
Patient called and stated the [facility] pharmacy wanted clarification on the medication  methylphenidate (METADATE ER) 20 MG ER tablet and they sent a fax. Call back number [phone].

## 2022-06-06 NOTE — Telephone Encounter (Signed)
Received notification from [facility] regarding a prior authorization for Methylphenidate ER '20mg'$ .   Authorization has been APPROVED from 05/06/22 to 06/05/23.   Key: [id number]  PA Case ID: [id number]

## 2022-06-07 NOTE — Telephone Encounter (Signed)
Pharmacy took care of this, PA was approved and pharmacy aware

## 2022-06-12 ENCOUNTER — Encounter: Payer: Self-pay | Admitting: Internal Medicine

## 2022-06-24 ENCOUNTER — Other Ambulatory Visit: Payer: Self-pay | Admitting: Family Medicine

## 2022-06-27 ENCOUNTER — Ambulatory Visit (AMBULATORY_SURGERY_CENTER): Payer: BC Managed Care – PPO | Admitting: *Deleted

## 2022-06-27 VITALS — Ht 67.0 in | Wt 180.2 lb

## 2022-06-27 DIAGNOSIS — Z86010 Personal history of colonic polyps: Secondary | ICD-10-CM

## 2022-06-27 NOTE — Progress Notes (Signed)
No egg or soy allergy known to patient  No issues known to pt with past sedation with any surgeries or procedures Pt has never been intubated No FH of Malignant Hyperthermia Pt is not on diet pills Pt is not on  home 02  Pt is not on blood thinners  Pt denies issues with constipation    Patient's chart reviewed by Osvaldo Angst CNRA prior to previsit and patient appropriate for the [place].  Previsit completed and red dot placed by patient's name on their procedure day (on provider's schedule).

## 2022-07-29 ENCOUNTER — Encounter: Payer: Self-pay | Admitting: Internal Medicine

## 2022-08-02 ENCOUNTER — Encounter: Payer: 59 | Admitting: Internal Medicine

## 2022-08-23 ENCOUNTER — Other Ambulatory Visit: Payer: Self-pay | Admitting: Family Medicine

## 2022-09-03 ENCOUNTER — Encounter: Payer: Self-pay | Admitting: Internal Medicine

## 2022-09-03 ENCOUNTER — Ambulatory Visit (AMBULATORY_SURGERY_CENTER): Payer: 59 | Admitting: Internal Medicine

## 2022-09-03 VITALS — BP 116/82 | HR 69 | Temp 96.9°F | Resp 14 | Ht 67.0 in | Wt 180.2 lb

## 2022-09-03 DIAGNOSIS — Z86010 Personal history of colonic polyps: Secondary | ICD-10-CM | POA: Diagnosis not present

## 2022-09-03 DIAGNOSIS — Z09 Encounter for follow-up examination after completed treatment for conditions other than malignant neoplasm: Secondary | ICD-10-CM

## 2022-09-03 MED ORDER — SODIUM CHLORIDE 0.9 % IV SOLN: 500.0000 mL | Freq: Once | INTRAVENOUS | Status: DC

## 2022-09-03 NOTE — Patient Instructions (Addendum)
No polyps today and no cancer.  You still have diverticulosis - thickened muscle rings and pouches in the colon wall. Please read the handout about this condition.  No need to repeat routine colonoscopy - only if signs or symptoms would indicate and I hope that does not occur.  I appreciate the opportunity to care for you. Gatha Mayer, MD, FACG   YOU HAD AN ENDOSCOPIC PROCEDURE TODAY AT [facility]:   Refer to the procedure report that was given to you for any specific questions about what was found during the examination.  If the procedure report does not answer your questions, please call your gastroenterologist to clarify.  If you requested that your care partner not be given the details of your procedure findings, then the procedure report has been included in a sealed envelope for you to review at your convenience later.  YOU SHOULD EXPECT: Some feelings of bloating in the abdomen. Passage of more gas than usual.  Walking can help get rid of the air that was put into your GI tract during the procedure and reduce the bloating. If you had a lower endoscopy (such as a colonoscopy or flexible sigmoidoscopy) you may notice spotting of blood in your stool or on the toilet paper. If you underwent a bowel prep for your procedure, you may not have a normal bowel movement for a few days.  Please Note:  You might notice some irritation and congestion in your nose or some drainage.  This is from the oxygen used during your procedure.  There is no need for concern and it should clear up in a day or so.  SYMPTOMS TO REPORT IMMEDIATELY:  Following lower endoscopy (colonoscopy or flexible sigmoidoscopy):  Excessive amounts of blood in the stool  Significant tenderness or worsening of abdominal pains  Swelling of the abdomen that is new, acute  Fever of 100F or higher    For urgent or emergent issues, a gastroenterologist can be reached at any hour by calling [phone]. Do  not use MyChart messaging for urgent concerns.    DIET:  We do recommend a small meal at first, but then you may proceed to your regular diet.  Drink plenty of fluids but you should avoid alcoholic beverages for 24 hours.  ACTIVITY:  You should plan to take it easy for the rest of today and you should NOT DRIVE or use heavy machinery until tomorrow (because of the sedation medicines used during the test).    FOLLOW UP: Our staff will call the number listed on your records the next business day following your procedure.  We will call around 7:15- 8:00 am to check on you and address any questions or concerns that you may have regarding the information given to you following your procedure. If we do not reach you, we will leave a message.     If any biopsies were taken you will be contacted by phone or by letter within the next 1-3 weeks.  Please call us at [phone] if you have not heard about the biopsies in 3 weeks.    SIGNATURES/CONFIDENTIALITY: You and/or your care partner have signed paperwork which will be entered into your electronic medical record.  These signatures attest to the fact that that the information above on your After Visit Summary has been reviewed and is understood.  Full responsibility of the confidentiality of this discharge information lies with you and/or your care-partner.

## 2022-09-03 NOTE — Progress Notes (Signed)
[facility] History and Physical   Primary Care Physician:  Tower, Wynelle Fanny, MD   Reason for Procedure:   Hx colon polyps  Plan:    colonoscopy     HPI: Benjamin Bullock is a 72 y.o. male for polyp surveillance  09/2018 5 polyps - ssp's and adenomas Past Medical History:  Diagnosis Date   ADD (attention deficit disorder)    Allergy    allergic rhinitis   Anxiety    Bursitis of hip, right    Depression    GERD (gastroesophageal reflux disease)    History of meniscal tear    left knee meniscal tear   Hyperlipidemia     Past Surgical History:  Procedure Laterality Date   COLONOSCOPY      Prior to Admission medications   Medication Sig Start Date End Date Taking? Authorizing Provider  Acetaminophen (TYLENOL PO) Take by mouth as needed.   Yes [provider]  methylphenidate (METADATE ER) 20 MG ER tablet Take 1 tablet (20 mg total) by mouth daily as needed. 05/30/22  Yes Tower, Wynelle Fanny, MD  pantoprazole (PROTONIX) 40 MG tablet TAKE 1 TABLET BY MOUTH TWICE A DAY Patient taking differently: Take 40 mg by mouth daily. 06/24/22  Yes Tower, Wynelle Fanny, MD  rosuvastatin (CRESTOR) 40 MG tablet TAKE 1 TABLET BY MOUTH EVERY DAY 03/28/22  Yes Tower, Wynelle Fanny, MD  sertraline (ZOLOFT) 50 MG tablet Take 1 tablet (50 mg total) by mouth daily. 01/28/22  Yes Tower, Wynelle Fanny, MD  Thiamine HCl (VITAMIN B-1 PO) Take by mouth daily.   Yes [provider]  VITAMIN D PO Take by mouth daily.   Yes [provider]  sildenafil (REVATIO) 20 MG tablet TAKE 3 TABLETS BY MOUTH 30 MINUTES BEFORE SEXUAL ACTIVITY AS NEEDED 08/26/22   Tower, Wynelle Fanny, MD    Current Outpatient Medications  Medication Sig Dispense Refill   Acetaminophen (TYLENOL PO) Take by mouth as needed.     methylphenidate (METADATE ER) 20 MG ER tablet Take 1 tablet (20 mg total) by mouth daily as needed. 30 tablet 0   pantoprazole (PROTONIX) 40 MG tablet TAKE 1 TABLET BY MOUTH TWICE A DAY (Patient taking  differently: Take 40 mg by mouth daily.) 180 tablet 1   rosuvastatin (CRESTOR) 40 MG tablet TAKE 1 TABLET BY MOUTH EVERY DAY 90 tablet 0   sertraline (ZOLOFT) 50 MG tablet Take 1 tablet (50 mg total) by mouth daily. 90 tablet 3   Thiamine HCl (VITAMIN B-1 PO) Take by mouth daily.     VITAMIN D PO Take by mouth daily.     sildenafil (REVATIO) 20 MG tablet TAKE 3 TABLETS BY MOUTH 30 MINUTES BEFORE SEXUAL ACTIVITY AS NEEDED 30 tablet 5   Current Facility-Administered Medications  Medication Dose Route Frequency Provider Last Rate Last Admin   0.9 %  sodium chloride infusion  500 mL Intravenous Once Gatha Mayer, MD        Allergies as of 09/03/2022 - Review Complete 09/03/2022  Allergen Reaction Noted   Aleve [naproxen sodium] Swelling 01/28/2012    Family History  Problem Relation Age of Onset   Heart disease Father        CAD   Hypertension Mother    Depression Mother    Heart disease Paternal Uncle        CAD   COPD Maternal Grandfather    Colon cancer Neg Hx    Colon polyps Neg Hx  Rectal cancer Neg Hx    Stomach cancer Neg Hx    Esophageal cancer Neg Hx     Social History   Socioeconomic History   Marital status: Married    Spouse name: Not on file   Number of children: Not on file   Years of education: Not on file   Highest education level: Not on file  Occupational History   Not on file  Tobacco Use   Smoking status: Former    Types: Cigarettes    Quit date: 73    Years since quitting: 46.1   Smokeless tobacco: Never   Tobacco comments:    Smoked for about one year 30 years ago  Vaping Use   Vaping Use: Never used  Substance and Sexual Activity   Alcohol use: Yes    Alcohol/week: 0.0 standard drinks of alcohol    Comment: occ   Drug use: Never   Sexual activity: Not on file  Other Topics Concern   Not on file  Social History Narrative   financial advisor   Social Determinants of Health   Financial Resource Strain: Not on file  Food  Insecurity: Not on file  Transportation Needs: Not on file  Physical Activity: Not on file  Stress: Not on file  Social Connections: Not on file  Intimate Partner Violence: Not on file    Review of Systems:  All other review of systems negative except as mentioned in the HPI.  Physical Exam: Vital signs BP 128/86   Pulse 79   Temp (!) 96.9 F (36.1 C)   Ht 5' 7"$  (1.702 m)   Wt 180 lb 3.2 oz (81.7 kg)   SpO2 95%   BMI 28.22 kg/m   General:   Alert,  Well-developed, well-nourished, pleasant and cooperative in NAD Lungs:  Clear throughout to auscultation.   Heart:  Regular rate and rhythm; no murmurs, clicks, rubs,  or gallops. Abdomen:  Soft, nontender and nondistended. Normal bowel sounds.   Neuro/Psych:  Alert and cooperative. Normal mood and affect. A and O x 3   @Steward Sames$  Simonne Maffucci, MD, [facility] [phone] (pager) 09/03/2022 11:26 AM@

## 2022-09-03 NOTE — Progress Notes (Signed)
Sedate, gd SR, tolerated procedure well, VSS, report to RN 

## 2022-09-03 NOTE — Op Note (Signed)
[place] Patient Name: Benjamin Bullock Procedure Date: 09/03/2022 11:33 AM MRN: [id number] Endoscopist: Gatha Mayer , MD, [id number] Age: 72 Referring MD:  Date of Birth: 06-25-1951 Gender: Male Account #: [id number] Procedure:                Colonoscopy Indications:              Surveillance: Personal history of adenomatous                            polyps on last colonoscopy > 3 years ago, Last                            colonoscopy: March 2020 Medicines:                Monitored Anesthesia Care Procedure:                Pre-Anesthesia Assessment:                           - Prior to the procedure, a History and Physical                            was performed, and patient medications and                            allergies were reviewed. The patient's tolerance of                            previous anesthesia was also reviewed. The risks                            and benefits of the procedure and the sedation                            options and risks were discussed with the patient.                            All questions were answered, and informed consent                            was obtained. Prior Anticoagulants: The patient has                            taken no anticoagulant or antiplatelet agents. ASA                            Grade Assessment: II - A patient with mild systemic                            disease. After reviewing the risks and benefits,                            the patient was deemed in satisfactory condition to  undergo the procedure.                           After obtaining informed consent, the colonoscope                            was passed under direct vision. Throughout the                            procedure, the patient's blood pressure, pulse, and                            oxygen saturations were monitored continuously. The                            CF [id number] was introduced through  the anus                            and advanced to the the cecum, identified by                            appendiceal orifice and ileocecal valve. The                            colonoscopy was performed without difficulty. The                            patient tolerated the procedure well. The quality                            of the bowel preparation was good. The ileocecal                            valve, appendiceal orifice, and rectum were                            photographed. The bowel preparation used was                            Miralax via split dose instruction. Scope In: 11:39:09 AM Scope Out: 11:53:45 AM Scope Withdrawal Time: 0 hours 11 minutes 54 seconds  Total Procedure Duration: 0 hours 14 minutes 36 seconds  Findings:                 The digital rectal exam findings include enlarged                            prostate. Pertinent negatives include no palpable                            rectal lesions.                           Multiple diverticula were found in the sigmoid  colon.                           The exam was otherwise without abnormality on                            direct and retroflexion views. Complications:            No immediate complications. Estimated Blood Loss:     Estimated blood loss: none. Impression:               - Enlarged prostate found on digital rectal exam.                           - Diverticulosis in the sigmoid colon.                           - The examination was otherwise normal on direct                            and retroflexion views.                           - No specimens collected.                           - Personal history of colonic polyps. 5 - mix                            ssp/adenomas 09/2018 Recommendation:           - Patient has a contact number available for                            emergencies. The signs and symptoms of potential                            delayed  complications were discussed with the                            patient. Return to normal activities tomorrow.                            Written discharge instructions were provided to the                            patient.                           - Resume previous diet.                           - Continue present medications.                           - No repeat colonoscopy due to current age (54  years or older) and the absence of colonic polyps. Gatha Mayer, MD 09/03/2022 12:00:21 PM This report has been signed electronically.

## 2022-09-04 ENCOUNTER — Telehealth: Payer: Self-pay | Admitting: *Deleted

## 2022-09-04 NOTE — Telephone Encounter (Signed)
Left message on f/u call 

## 2022-09-14 ENCOUNTER — Other Ambulatory Visit: Payer: Self-pay | Admitting: Family Medicine

## 2022-09-19 ENCOUNTER — Encounter: Payer: 59 | Admitting: Internal Medicine

## 2022-10-07 ENCOUNTER — Other Ambulatory Visit: Payer: Self-pay | Admitting: Family Medicine

## 2022-12-14 ENCOUNTER — Other Ambulatory Visit: Payer: Self-pay | Admitting: Family Medicine

## 2023-01-02 ENCOUNTER — Ambulatory Visit: Payer: 59 | Admitting: Family Medicine

## 2023-01-02 ENCOUNTER — Encounter: Payer: Self-pay | Admitting: Family Medicine

## 2023-01-02 VITALS — BP 124/68 | HR 61 | Temp 97.9°F | Ht 67.0 in | Wt 174.2 lb

## 2023-01-02 DIAGNOSIS — Z79899 Other long term (current) drug therapy: Secondary | ICD-10-CM

## 2023-01-02 DIAGNOSIS — E559 Vitamin D deficiency, unspecified: Secondary | ICD-10-CM | POA: Diagnosis not present

## 2023-01-02 DIAGNOSIS — E7800 Pure hypercholesterolemia, unspecified: Secondary | ICD-10-CM

## 2023-01-02 DIAGNOSIS — F418 Other specified anxiety disorders: Secondary | ICD-10-CM | POA: Diagnosis not present

## 2023-01-02 DIAGNOSIS — R5382 Chronic fatigue, unspecified: Secondary | ICD-10-CM

## 2023-01-02 DIAGNOSIS — R7303 Prediabetes: Secondary | ICD-10-CM

## 2023-01-02 DIAGNOSIS — E538 Deficiency of other specified B group vitamins: Secondary | ICD-10-CM | POA: Diagnosis not present

## 2023-01-02 LAB — CBC WITH DIFFERENTIAL/PLATELET
Basophils Absolute: 0 10*3/uL (ref 0.0–0.1)
Basophils Relative: 1.1 % (ref 0.0–3.0)
Eosinophils Absolute: 0 10*3/uL (ref 0.0–0.7)
Eosinophils Relative: 1.1 % (ref 0.0–5.0)
HCT: 43.3 % (ref 39.0–52.0)
Hemoglobin: 14.3 g/dL (ref 13.0–17.0)
Lymphocytes Relative: 30.4 % (ref 12.0–46.0)
Lymphs Abs: 1.2 10*3/uL (ref 0.7–4.0)
MCHC: 33.1 g/dL (ref 30.0–36.0)
MCV: 91.9 fl (ref 78.0–100.0)
Monocytes Absolute: 0.4 10*3/uL (ref 0.1–1.0)
Monocytes Relative: 9.6 % (ref 3.0–12.0)
Neutro Abs: 2.2 10*3/uL (ref 1.4–7.7)
Neutrophils Relative %: 57.8 % (ref 43.0–77.0)
Platelets: 176 10*3/uL (ref 150.0–400.0)
RBC: 4.71 Mil/uL (ref 4.22–5.81)
RDW: 14.3 % (ref 11.5–15.5)
WBC: 3.9 10*3/uL — ABNORMAL LOW (ref 4.0–10.5)

## 2023-01-02 LAB — COMPREHENSIVE METABOLIC PANEL
ALT: 22 U/L (ref 0–53)
AST: 20 U/L (ref 0–37)
Albumin: 4.3 g/dL (ref 3.5–5.2)
Alkaline Phosphatase: 40 U/L (ref 39–117)
BUN: 15 mg/dL (ref 6–23)
CO2: 27 mEq/L (ref 19–32)
Calcium: 9 mg/dL (ref 8.4–10.5)
Chloride: 103 mEq/L (ref 96–112)
Creatinine, Ser: 0.76 mg/dL (ref 0.40–1.50)
GFR: 88.61 mL/min (ref >60.00)
Glucose, Bld: 100 mg/dL — ABNORMAL HIGH (ref 70–99)
Potassium: 4.3 mEq/L (ref 3.5–5.1)
Sodium: 137 mEq/L (ref 135–145)
Total Bilirubin: 0.8 mg/dL (ref 0.2–1.2)
Total Protein: 6.5 g/dL (ref 6.0–8.3)

## 2023-01-02 LAB — T4, FREE: Free T4: 0.9 ng/dL (ref 0.60–1.60)

## 2023-01-02 LAB — HEMOGLOBIN A1C: Hgb A1c MFr Bld: 6 % (ref 4.6–6.5)

## 2023-01-02 LAB — VITAMIN D 25 HYDROXY (VIT D DEFICIENCY, FRACTURES): VITD: 24.68 ng/mL — ABNORMAL LOW (ref 30.00–100.00)

## 2023-01-02 LAB — IRON: Iron: 115 ug/dL (ref 42–165)

## 2023-01-02 LAB — TSH: TSH: 1.9 u[IU]/mL (ref 0.35–5.50)

## 2023-01-02 LAB — VITAMIN B12: Vitamin B-12: 462 pg/mL (ref 211–911)

## 2023-01-02 NOTE — Assessment & Plan Note (Signed)
B12 and D level today   Protonix 40 mg bid Has not been able to get off this

## 2023-01-02 NOTE — Assessment & Plan Note (Signed)
A1c ordered Eating better Weight loss noted Encouraged strength training when able

## 2023-01-02 NOTE — Assessment & Plan Note (Signed)
Overall good about his oral supplement but missed a few this week  I think fatigue is more from work schedule than this  Labs today   Still on ppi

## 2023-01-02 NOTE — Assessment & Plan Note (Signed)
Suspect multi factorial  Still supporting son and grandkids and wife and cannot retire yet  Less time for self care and sleep and exercise  Thinks his mood is overall better   Lab today incl B12 and D and thyroid labs and iron

## 2023-01-02 NOTE — Patient Instructions (Addendum)
Labs today for fatigue   Try and keep sleep and wake times the same   Eat a healthy balanced diet   Continue your supplements   When you can - add strength training Add some strength training to your routine, this is important for bone and brain health and can reduce your risk of falls and help your body use insulin properly and regulate weight  Light weights, exercise bands , and internet videos are a good way to start  Yoga (chair or regular), machines , floor exercises or a gym with machines are also good options

## 2023-01-02 NOTE — Assessment & Plan Note (Signed)
D level today  Per pt- good about taking it daily but missed some this week when he ran out

## 2023-01-02 NOTE — Assessment & Plan Note (Signed)
Disc goals for lipids and reasons to control them Rev last labs with pt Rev low sat fat diet in detail Crestor 40 mg daily -tolerating well  LDL of 91   

## 2023-01-02 NOTE — Progress Notes (Signed)
Subjective:    Patient ID: Benjamin Bullock, male    DOB: Oct 19, 1950, 72 y.o.   MRN: [id number]  HPI Pt presents for c/o fatigue and B12 deficiency Mood  Prediabetes  Vit D def   Wt Readings from Last 3 Encounters:  01/02/23 174 lb 4 oz (79 kg)  09/03/22 180 lb 3.2 oz (81.7 kg)  06/27/22 180 lb 3.2 oz (81.7 kg)   27.29 kg/m  Vitals:   01/02/23 0956  BP: 124/68  Pulse: 61  Temp: 97.9 F (36.6 C)  SpO2: 95%   Trying to loose weight  Eating less -with his wife  Walking 4 d per week  [facility] and wants to start some strength training also eventually     Fatigue  Noted he is more fatigued when he wakes up  Over the past year   Stress- helping support son's family after he had MI  He is re entering the work force   Working as much as he can  More help at work lately- very helpful  Still really likes his job / wishes he could work less      ? If B12 level is low Had a shot in 2018  Then B12 oral 1000 mcg daily  Has missed some days recently   Has not taken D for a week      Lab Results  Component Value Date   VITAMINB12 444 05/23/2022   Lab Results  Component Value Date   WBC 3.9 (L) 05/23/2022   HGB 14.3 05/23/2022   HCT 42.9 05/23/2022   MCV 91.9 05/23/2022   PLT 166.0 05/23/2022   CMP     Component Value Date/Time   NA 140 05/23/2022 0806   K 4.4 05/23/2022 0806   CL 103 05/23/2022 0806   CO2 31 05/23/2022 0806   GLUCOSE 98 05/23/2022 0806   BUN 14 05/23/2022 0806   CREATININE 0.84 05/23/2022 0806   CALCIUM 9.0 05/23/2022 0806   PROT 6.4 05/23/2022 0806   ALBUMIN 4.4 05/23/2022 0806   AST 19 05/23/2022 0806   ALT 21 05/23/2022 0806   ALKPHOS 39 05/23/2022 0806   BILITOT 0.7 05/23/2022 0806   GFR 86.34 05/23/2022 0806   GFRNONAA 91.10 11/08/2009 0825   Prediabetes Lab Results  Component Value Date   HGBA1C 6.3 05/23/2022   Vit D def Last vitamin D Lab Results  Component Value Date   VD25OH  25.65 (L) 05/23/2022       Mood     11/19/2021    2:03 PM 05/28/2021    8:18 AM 11/11/2019    5:02 PM 07/13/2019   11:10 AM 05/01/2018    9:07 AM  Depression screen PHQ 2/9  Decreased Interest 0 0 0 0 0  Down, Depressed, Hopeless 0 0 0  1  PHQ - 2 Score 0 0 0 0 1  Altered sleeping  0  1 1  Tired, decreased energy  0  0 2  Change in appetite  0  0 0  Feeling bad or failure about yourself   0  1 0  Trouble concentrating  0  2 0  Moving slowly or fidgety/restless  0  0 0  Suicidal thoughts  0  0 0  PHQ-9 Score  0  4 4  Difficult doing work/chores  Not difficult at all     Thinks mood is stable and ok overall  Today PHQ 0 also  Lab Results  Component Value Date   CHOL 165 05/23/2022   HDL 54.90 05/23/2022   LDLCALC 91 05/23/2022   LDLDIRECT 130.9 10/25/2008   TRIG 92.0 05/23/2022   CHOLHDL 3 05/23/2022      Patient Active Problem List   Diagnosis Date Noted   Elevated BP without diagnosis of hypertension 11/19/2021   Encounter for hepatitis C screening test for low risk patient 05/28/2021   Vitamin D deficiency 05/28/2021   Diverticulosis 05/19/2020   Lower abdominal pain 08/18/2018   Prediabetes 05/22/2017   B12 deficiency 04/22/2017   Current use of proton pump inhibitor 04/15/2016   Fatigue 11/06/2014   Anxiety 08/20/2013   Colon cancer screening 03/31/2012   Allergic drug reaction 01/29/2012   Back pain 02/05/2011   Routine general medical examination at a health care facility 11/08/2010   Prostate cancer screening 11/08/2010   GERD 09/20/2009   ANXIETY STATE NOS 01/08/2007   Attention deficit disorder 01/08/2007   Hyperlipidemia 01/07/2007   ERECTILE DYSFUNCTION 01/07/2007   Depression with anxiety 01/07/2007   Allergic rhinitis 01/07/2007   Past Medical History:  Diagnosis Date   ADD (attention deficit disorder)    Allergy    allergic rhinitis   Anxiety    Bursitis of hip, right    Depression    GERD (gastroesophageal reflux disease)     History of meniscal tear    left knee meniscal tear   Hyperlipidemia    Past Surgical History:  Procedure Laterality Date   COLONOSCOPY     Social History   Tobacco Use   Smoking status: Former    Types: Cigarettes    Quit date: 1978    Years since quitting: 46.4   Smokeless tobacco: Never   Tobacco comments:    Smoked for about one year 30 years ago  Vaping Use   Vaping Use: Never used  Substance Use Topics   Alcohol use: Yes    Alcohol/week: 0.0 standard drinks of alcohol    Comment: occ   Drug use: Never   Family History  Problem Relation Age of Onset   Heart disease Father        CAD   Hypertension Mother    Depression Mother    Heart disease Paternal Uncle        CAD   COPD Maternal Grandfather    Colon cancer Neg Hx    Colon polyps Neg Hx    Rectal cancer Neg Hx    Stomach cancer Neg Hx    Esophageal cancer Neg Hx    Allergies  Allergen Reactions   Aleve [Naproxen Sodium] Swelling    Face and lips swollen and hives   Current Outpatient Medications on File Prior to Visit  Medication Sig Dispense Refill   Acetaminophen (TYLENOL PO) Take by mouth as needed.     cyanocobalamin (VITAMIN B12) 1000 MCG tablet Take 1,000 mcg by mouth daily.     DOXYCYCLINE CALCIUM PO Take 1 Capful by mouth in the morning and at bedtime.     methylphenidate (METADATE ER) 20 MG ER tablet Take 1 tablet (20 mg total) by mouth daily as needed. 30 tablet 0   pantoprazole (PROTONIX) 40 MG tablet TAKE 1 TABLET BY MOUTH TWICE A DAY 180 tablet 1   rosuvastatin (CRESTOR) 40 MG tablet TAKE 1 TABLET BY MOUTH EVERY DAY 90 tablet 2   sertraline (ZOLOFT) 50 MG tablet Take 1 tablet (50 mg total) by mouth daily. 90 tablet 3   sildenafil (  REVATIO) 20 MG tablet TAKE 3 TABLETS BY MOUTH 30 MINUTES BEFORE SEXUAL ACTIVITY AS NEEDED 30 tablet 5   Thiamine HCl (VITAMIN B-1 PO) Take by mouth daily.     VITAMIN D PO Take 2,000 Int'l Units/1.20m2 by mouth daily.     No current facility-administered  medications on file prior to visit.    Review of Systems  Constitutional:  Positive for fatigue. Negative for activity change, appetite change, fever and unexpected weight change.  HENT:  Negative for congestion, rhinorrhea, sore throat and trouble swallowing.   Eyes:  Negative for pain, redness, itching and visual disturbance.  Respiratory:  Negative for cough, chest tightness, shortness of breath and wheezing.   Cardiovascular:  Negative for chest pain and palpitations.  Gastrointestinal:  Negative for abdominal pain, blood in stool, constipation, diarrhea and nausea.  Endocrine: Negative for cold intolerance, heat intolerance, polydipsia and polyuria.  Genitourinary:  Negative for difficulty urinating, dysuria, frequency and urgency.  Musculoskeletal:  Positive for arthralgias. Negative for joint swelling and myalgias.       Rotator cuff problem  Skin:  Negative for pallor and rash.  Neurological:  Negative for dizziness, tremors, weakness, numbness and headaches.  Hematological:  Negative for adenopathy. Does not bruise/bleed easily.  Psychiatric/Behavioral:  Negative for decreased concentration and dysphoric mood. The patient is not nervous/anxious.        Mood is better  Does get overwhelmed with job and family responsibilities        Objective:   Physical Exam Constitutional:      General: He is not in acute distress.    Appearance: Normal appearance. He is well-developed and normal weight. He is not ill-appearing or diaphoretic.     Comments: Appears somewhat fatigued but not frail  HENT:     Head: Normocephalic and atraumatic.  Eyes:     Conjunctiva/sclera: Conjunctivae normal.     Pupils: Pupils are equal, round, and reactive to light.  Neck:     Thyroid: No thyromegaly.     Vascular: No carotid bruit or JVD.  Cardiovascular:     Rate and Rhythm: Normal rate and regular rhythm.     Heart sounds: Normal heart sounds.     No gallop.  Pulmonary:     Effort: Pulmonary  effort is normal. No respiratory distress.     Breath sounds: Normal breath sounds. No wheezing or rales.  Abdominal:     General: There is no distension or abdominal bruit.     Palpations: Abdomen is soft.  Musculoskeletal:     Cervical back: Normal range of motion and neck supple.     Right lower leg: No edema.     Left lower leg: No edema.  Lymphadenopathy:     Cervical: No cervical adenopathy.  Skin:    General: Skin is warm and dry.     Coloration: Skin is not jaundiced or pale.     Findings: No bruising or rash.  Neurological:     Mental Status: He is alert.     Motor: No weakness.     Coordination: Coordination normal.     Deep Tendon Reflexes: Reflexes are normal and symmetric. Reflexes normal.  Psychiatric:        Mood and Affect: Mood normal.           Assessment & Plan:   Problem List Items Addressed This Visit       Other   Vitamin D deficiency    D level today  Per  pt- good about taking it daily but missed some this week when he ran out      Relevant Orders   VITAMIN D 25 Hydroxy (Vit-D Deficiency, Fractures)   Prediabetes    A1c ordered Eating better Weight loss noted Encouraged strength training when able        Relevant Orders   Hemoglobin A1c   Hyperlipidemia    Disc goals for lipids and reasons to control them Rev last labs with pt Rev low sat fat diet in detail Crestor 40 mg daily -tolerating well  LDL of 91        Fatigue - Primary    Suspect multi factorial  Still supporting son and grandkids and wife and cannot retire yet  Less time for self care and sleep and exercise  Thinks his mood is overall better   Lab today incl B12 and D and thyroid labs and iron        Relevant Orders   T4, free   Iron   CBC with Differential/Platelet   TSH   Comprehensive metabolic panel   Depression with anxiety    Per pt improved Continues to work but has more help and does feel overwhelmed at times   Continues sertraline 50 mg daily    Overall needs more time for self care and sleep      Current use of proton pump inhibitor    B12 and D level today   Protonix 40 mg bid Has not been able to get off this      B12 deficiency    Overall good about his oral supplement but missed a few this week  I think fatigue is more from work schedule than this  Labs today   Still on ppi      Relevant Orders   Vitamin B12

## 2023-01-02 NOTE — Assessment & Plan Note (Signed)
Per pt improved Continues to work but has more help and does feel overwhelmed at times   Continues sertraline 50 mg daily   Overall needs more time for self care and sleep

## 2023-01-03 ENCOUNTER — Encounter: Payer: Self-pay | Admitting: *Deleted

## 2023-01-03 NOTE — Addendum Note (Signed)
Addended by: Shon Millet on: 01/03/2023 10:38 AM   Modules accepted: Orders

## 2023-02-14 ENCOUNTER — Other Ambulatory Visit: Payer: Self-pay | Admitting: Family Medicine

## 2023-02-14 MED ORDER — METHYLPHENIDATE HCL ER 20 MG PO TBCR: 20.0000 mg | EXTENDED_RELEASE_TABLET | Freq: Every day | ORAL | 0 refills | Status: DC | PRN

## 2023-02-14 NOTE — Telephone Encounter (Signed)
Last filled 05-30-22 #30 Last OV 01-02-23 Next OV 06-02-23 [facility]

## 2023-02-14 NOTE — Telephone Encounter (Signed)
Prescription Request  02/14/2023  LOV: 01/02/2023  What is the name of the medication or equipment? methylphenidate (METADATE ER) 20 MG ER tablet  Have you contacted your pharmacy to request a refill? No   Which pharmacy would you like this sent to?  [id number] - [place], [place] - [address] AT [address] OF [address] [place] [address] Phone: [phone] Fax: [phone]    Patient notified that their request is being sent to the clinical staff for review and that they should receive a response within 2 business days.   Please advise at Mobile [phone] (mobile)

## 2023-02-17 ENCOUNTER — Other Ambulatory Visit: Payer: Self-pay | Admitting: Family Medicine

## 2023-02-27 ENCOUNTER — Encounter (HOSPITAL_BASED_OUTPATIENT_CLINIC_OR_DEPARTMENT_OTHER): Payer: Self-pay | Admitting: Emergency Medicine

## 2023-02-27 ENCOUNTER — Other Ambulatory Visit: Payer: Self-pay

## 2023-02-27 ENCOUNTER — Emergency Department (HOSPITAL_BASED_OUTPATIENT_CLINIC_OR_DEPARTMENT_OTHER)
Admission: EM | Admit: 2023-02-27 | Discharge: 2023-02-28 | Disposition: A | Discharge status: Home / Self Care | Payer: 59 | Source: Home / Self Care | Attending: Emergency Medicine | Admitting: Emergency Medicine

## 2023-02-27 ENCOUNTER — Emergency Department (HOSPITAL_BASED_OUTPATIENT_CLINIC_OR_DEPARTMENT_OTHER): Payer: 59

## 2023-02-27 DIAGNOSIS — S6010XA Contusion of unspecified finger with damage to nail, initial encounter: Secondary | ICD-10-CM

## 2023-02-27 DIAGNOSIS — W230XXA Caught, crushed, jammed, or pinched between moving objects, initial encounter: Secondary | ICD-10-CM | POA: Diagnosis not present

## 2023-02-27 DIAGNOSIS — S6992XA Unspecified injury of left wrist, hand and finger(s), initial encounter: Secondary | ICD-10-CM | POA: Diagnosis present

## 2023-02-27 DIAGNOSIS — S62639B Displaced fracture of distal phalanx of unspecified finger, initial encounter for open fracture: Secondary | ICD-10-CM

## 2023-02-27 MED ORDER — LIDOCAINE HCL (PF) 1 % IJ SOLN
10.0000 mL | Freq: Once | INTRAMUSCULAR | Status: AC
  Administered 2023-02-27: 10 mL
  Filled 2023-02-27: qty 10

## 2023-02-27 MED ORDER — OXYCODONE-ACETAMINOPHEN 5-325 MG PO TABS
1.0000 | ORAL_TABLET | Freq: Once | ORAL | Status: AC
  Administered 2023-02-27: 1 via ORAL
  Filled 2023-02-27: qty 1

## 2023-02-27 MED ORDER — LIDOCAINE HCL (PF) 1 % IJ SOLN
INTRAMUSCULAR | Status: AC
  Filled 2023-02-27: qty 5

## 2023-02-27 MED ORDER — CEFAZOLIN SODIUM 1 G IM
2.0000 g | Freq: Once | INTRAMUSCULAR | Status: AC
  Administered 2023-02-27: 2 g via INTRAMUSCULAR
  Filled 2023-02-27: qty 20

## 2023-02-27 NOTE — ED Provider Notes (Signed)
EMERGENCY DEPARTMENT AT [facility] Provider Note   CSN: [id number] Arrival date & time: 02/27/23  1731     History Chief Complaint  Patient presents with   Finger Injury    Benjamin Bullock is a 72 y.o. male with h/o HLD and GERD presents to the ER for evaluation of left pinky injury. The patient reports that shortly prior to arrival that he slammed it in a car door. He denies any numbness or tingling. No pain medication taken prior to arrival. Reports his tetanus is up to date.  Allergic to NSAIDs.  Patient reports he is currently on a daily doxycycline for at least the next few weeks because of a dental procedure.  HPI     Home Medications Prior to Admission medications   Medication Sig Start Date End Date Taking? Authorizing Provider  Acetaminophen (TYLENOL PO) Take by mouth as needed.    [provider]  Cholecalciferol (VITAMIN D3) 50 MCG (2000 UT) capsule Take 4,000 Units by mouth daily.    [provider]  cyanocobalamin (VITAMIN B12) 1000 MCG tablet Take 1,000 mcg by mouth daily.    [provider]  DOXYCYCLINE CALCIUM PO Take 1 Capful by mouth in the morning and at bedtime.    [provider]  methylphenidate (METADATE ER) 20 MG ER tablet Take 1 tablet (20 mg total) by mouth daily as needed. 02/14/23   Tower, Audrie Gallus, MD  pantoprazole (PROTONIX) 40 MG tablet TAKE 1 TABLET BY MOUTH TWICE A DAY 12/17/22   Tower, [place] A, MD  rosuvastatin (CRESTOR) 40 MG tablet TAKE 1 TABLET BY MOUTH EVERY DAY 09/16/22   Tower, Audrie Gallus, MD  sertraline (ZOLOFT) 50 MG tablet TAKE 1 TABLET BY MOUTH EVERY DAY 02/17/23   Tower, Audrie Gallus, MD  sildenafil (REVATIO) 20 MG tablet TAKE 3 TABLETS BY MOUTH 30 MINUTES BEFORE SEXUAL ACTIVITY AS NEEDED 08/26/22   Tower, Audrie Gallus, MD  Thiamine HCl (VITAMIN B-1 PO) Take by mouth daily.    [provider]      Allergies    Aleve [naproxen sodium]    Review of Systems   Review of Systems   Constitutional:  Negative for chills and fever.  Musculoskeletal:  Positive for arthralgias.  Skin:  Positive for wound.    Physical Exam Updated Vital Signs BP (!) 162/97 (BP Location: Right Arm)   Pulse 76   Temp 97.9 F (36.6 C) (Oral)   Resp 15   Ht 5\' 9"  (1.753 m)   Wt 80.7 kg   SpO2 95%   BMI 26.29 kg/m  Physical Exam Vitals and nursing note reviewed.  Constitutional:      General: He is not in acute distress.    Appearance: Normal appearance. He is not ill-appearing or toxic-appearing.  Eyes:     General: No scleral icterus. Pulmonary:     Effort: Pulmonary effort is normal. No respiratory distress.  Musculoskeletal:     Comments: Subungual hematoma seen on the left fifth finger.  Nail appears to be intact.  On the palmar aspect of the distal fifth finger, there is an overlying laceration with some bruising.  Patient still has active flexion and extension of the finger.  Brisk cap refill.  No other injury or tenderness to any of the fingers.  Compartments are soft.  Bleeding controlled.  Skin:    General: Skin is dry.  Neurological:     General: No focal deficit present.     Mental  Status: He is alert. Mental status is at baseline.     Gait: Gait normal.  Psychiatric:        Mood and Affect: Mood normal.       ED Results / Procedures / Treatments   Labs (all labs ordered are listed, but only abnormal results are displayed) Labs Reviewed - No data to display  EKG None  Radiology DG Hand Complete Left  Addendum Date: 02/27/2023   ADDENDUM REPORT: 02/27/2023 21:24 ADDENDUM: Possible cortical irregularity along the base of the tuft of the distal phalanx of the fifth digit in the possibility of nondisplaced fracture can not be excluded. Findings were discussed with PA Ranson at 9:23 p.m. Electronically Signed   By: Thornell Sartorius M.D.   On: 02/27/2023 21:24   Result Date: 02/27/2023 CLINICAL DATA:  Left fourth finger injury. EXAM: LEFT HAND - COMPLETE 3+ VIEW  COMPARISON:  None Available. FINDINGS: There is no evidence of fracture or dislocation. There is no evidence of arthropathy or other focal bone abnormality. Bandage material is present over the fourth and fifth digits. IMPRESSION: No acute fracture or dislocation. Electronically Signed: By: Thornell Sartorius M.D. On: 02/27/2023 19:13    Procedures .Marland KitchenLaceration Repair  Date/Time: 02/28/2023 11:26 PM  Performed by: Achille Rich, PA-C Authorized by: Achille Rich, PA-C   Consent:    Consent obtained:  Verbal   Consent given by:  Patient   Risks, benefits, and alternatives were discussed: yes     Risks discussed:  Infection, pain, nerve damage, need for additional repair, retained foreign body, poor cosmetic result, tendon damage and poor wound healing   Alternatives discussed:  No treatment and delayed treatment Universal protocol:    Procedure explained and questions answered to patient or proxy's satisfaction: yes     Imaging studies available: yes     Patient identity confirmed:  Verbally with patient Anesthesia:    Anesthesia method:  Nerve block   Block location:  Digital block at the palmar base of the fifth finger   Block needle gauge:  25 G   Block anesthetic:  Lidocaine 1% w/o epi   Block injection procedure:  Anatomic landmarks identified, anatomic landmarks palpated, negative aspiration for blood, introduced needle and incremental injection   Block outcome:  Anesthesia achieved Laceration details:    Location:  Finger   Finger location:  L small finger   Length (cm):  2 Pre-procedure details:    Preparation:  Patient was prepped and draped in usual sterile fashion and imaging obtained to evaluate for foreign bodies Exploration:    Imaging obtained: x-ray     Imaging outcome: foreign body not noted     Wound exploration: wound explored through full range of motion and entire depth of wound visualized     Wound extent: underlying fracture     Contaminated: no   Treatment:     Area cleansed with:  Shur-Clens and saline   Amount of cleaning:  Extensive   Irrigation solution:  Sterile saline   Irrigation volume:  600 Skin repair:    Repair method:  Sutures   Suture size:  5-0   Suture material:  Prolene   Suture technique:  Simple interrupted   Number of sutures:  5 Approximation:    Approximation:  Close Repair type:    Repair type:  Simple Post-procedure details:    Dressing:  Antibiotic ointment, non-adherent dressing and splint for protection   Procedure completion:  Tolerated well, no immediate complications Drain paronychia  Date/Time: 02/28/2023 11:30 PM  Performed by: Achille Rich, PA-C Authorized by: Achille Rich, PA-C  Consent: Verbal consent obtained. Risks and benefits: risks, benefits and alternatives were discussed Consent given by: patient Patient understanding: patient states understanding of the procedure being performed Imaging studies: imaging studies available Patient identity confirmed: verbally with patient Local anesthesia used: yes Anesthesia: digital block  Anesthesia: Local anesthesia used: yes Local Anesthetic: lidocaine 1% without epinephrine Anesthetic total: 4 mL Comments: Digital block was placed and anesthesia was achieved. Nail was cleaned with alcohol after already being cleansed with Shurs clens and saline. I used a small tip Bovie and gently tap- pressed to the center base of the nail until blood was released.       Medications Ordered in ED Medications  oxyCODONE-acetaminophen (PERCOCET/ROXICET) 5-325 MG per tablet 1 tablet (1 tablet Oral Given 02/27/23 2122)  lidocaine (PF) (XYLOCAINE) 1 % injection 10 mL (10 mLs Other Given 02/27/23 2122)  ceFAZolin (ANCEF) injection 2 g (2 g Intramuscular Given 02/27/23 2154)    ED Course/ Medical Decision Making/ A&P  Medical Decision Making Amount and/or Complexity of Data Reviewed Radiology: ordered.  Risk Prescription drug management.   72 y.o. male presents to  the ER today for evaluation of finger injury. Differential diagnosis includes but is not limited to laceration versus fracture versus open fracture. Vital signs show elevated blood pressure otherwise unremarkable. Physical exam as noted above.   Tetanus is updated in November 2023, no need to update this at this time.  X-ray imaging shows no fracture or dislocation.  Upon my independent review of the images, I did question a nondisplaced fracture is seen.  I called radiology, Dr. Ladona Ridgel who reports Possible cortical irregularity along the base of the tuft of the distal phalanx of the fifth digit in the possibility of nondisplaced fracture can not be excluded.  Out of an abundance of precaution, I did give him 2 g of Ancef IM.  Will send him home with Duricef as well.  Will have him follow-up with hand surgery.  Please see procedure notes of nail trephination and laceration repair.   Patient was placed in an aluminum splint.  I gave him a prescription for antibiotics and a few narcotic pain medications. Educated him and his wife on wound care and making sure it stays clean and keeping the splint on unless cleaning. Stressed the important of following up with hand surgery. Stable for discharge home.   We discussed plan at bedside. We discussed strict return precautions and red flag symptoms. The patient verbalized their understanding and agrees to the plan. The patient is stable and being discharged home in good condition.  Portions of this report may have been transcribed using voice recognition software. Every effort was made to ensure accuracy; however, inadvertent computerized transcription errors may be present.   Final Clinical Impression(s) / ED Diagnoses Final diagnoses:  Open fracture of tuft of distal phalanx of finger  Subungual hematoma of digit of hand, initial encounter    Rx / DC Orders ED Discharge Orders          Ordered    cefadroxil (DURICEF) 500 MG capsule  2 times daily         02/28/23 0005    oxyCODONE (ROXICODONE) 5 MG immediate release tablet  Every 4 hours PRN        02/28/23 0006              Achille Rich, PA-C 02/28/23 2335  Sloan Leiter, DO 03/01/23 2255

## 2023-02-27 NOTE — ED Triage Notes (Signed)
Pt arrives to ED with c/o left ring finger injury after shutting his finger in a car door today.

## 2023-02-28 MED ORDER — OXYCODONE HCL 5 MG PO TABS: 5.0000 mg | ORAL_TABLET | ORAL | 0 refills | Status: DC | PRN

## 2023-02-28 MED ORDER — CEFADROXIL 500 MG PO CAPS: 500.0000 mg | ORAL_CAPSULE | Freq: Two times a day (BID) | ORAL | 0 refills | Status: DC

## 2023-02-28 NOTE — ED Notes (Signed)
Discharge instructions discussed with pt. Pt verbalized understanding. Pt stable and ambulatory.  °

## 2023-02-28 NOTE — Discharge Instructions (Addendum)
You were seen in the ER today for evaluation of your finger. There is a questionable fracture in the finger. For precautions, I am discharging you home on an antibiotic to take as prescribed. You will need to follow up with hand surgery. Please make sure you call the number for Dr. Merlyn Lot attached to this paperwork to schedule an appointment. For your suture removal, please return to your primary care, urgent care, or the ER in 7-10 days for removal. You will need to make sure this wound stays clean. Please clean daily with dial soap and water. Please make sure the finger is dry before applying the nonadherent guaze and finger splint back on. Please do not soak or expose your hands to dirty water such as dishwater, bath water, Jacuzzis, hot tubs, pools, lakes, rivers, etc.  Please make sure he is staying clean and dry and less when cleaning it.  He can also clean it when visibly dirty as well.  For pain, you can take at 1000 mg of Tylenol every 6 hours as needed.  I have also sent you in a few narcotic pain medication to take for breakthrough pain.  Please do not drive or operate any heavy machinery while on the narcotic as it can make you sleepy.  I have included additional information about your visit as well as return precautions and other care advice into the discharge paperwork.  Please review.  If you have any concerns, new or worsening symptoms, please return to your nearest emergency department for evaluation.  Contact a health care provider if: You received a tetanus shot and you have swelling, severe pain, redness, or bleeding at the injection site. Your pain is not controlled with medicine. You have any of these signs of infection: More redness, swelling, or pain around the wound. Fluid or blood coming from the wound. Warmth coming from the wound. A fever or chills. You are nauseous or you vomit. You are dizzy. You have a new rash or hardness around the wound. Get help right away if: You have  a red streak of skin near the area around your wound. Pus or a bad smell coming from the wound. Your wound has been closed with staples, sutures, skin glue, or adhesive strips and it begins to open up and separate. Your wound is bleeding, and the bleeding does not stop with gentle pressure. These symptoms may represent a serious problem that is an emergency. Do not wait to see if the symptoms will go away. Get medical help right away. Call your local emergency services (911 in the [place] Do not drive yourself to the hospital.

## 2023-03-03 ENCOUNTER — Telehealth: Payer: Self-pay

## 2023-03-03 NOTE — Transitions of Care (Post Inpatient/ED Visit) (Signed)
Pt scheduled to see Dr. Milinda Antis Friday 03/07/23 @ 10:30a.      03/03/2023  Name: Benjamin Bullock MRN: [id number] DOB: 1950-10-04  Today's TOC FU Call Status:    Transition Care Management Follow-up Telephone Call Discharge Facility: [facility] ([facility]) Type of Discharge: Emergency Department Reason for ED Visit: Other: (Broken finger with laceration) How have you been since you were released from the hospital?: Better Any questions or concerns?: No  Items Reviewed: Did you receive and understand the discharge instructions provided?: Yes Medications obtained,verified, and reconciled?: Yes (Medications Reviewed) Any new allergies since your discharge?: No Dietary orders reviewed?: No Do you have support at home?: Yes People in Home: spouse  Medications Reviewed Today: Medications Reviewed Today     Reviewed by Valentino Nose, RN (Registered Nurse) on 03/03/23 at 1152  Med List Status: <None>   Medication Order Taking? Sig Documenting Provider Last Dose Status Informant  Acetaminophen (TYLENOL PO) [id number] No Take by mouth as needed. [provider] Taking Active   cefadroxil (DURICEF) 500 MG capsule [id number]  Take 1 capsule (500 mg total) by mouth 2 (two) times daily. Achille Rich, PA-C  Active   Cholecalciferol (VITAMIN D3) 50 MCG (2000 UT) capsule [id number]  Take 4,000 Units by mouth daily. [provider]  Active   cyanocobalamin (VITAMIN B12) 1000 MCG tablet [id number]  Take 1,000 mcg by mouth daily. [provider]  Active   DOXYCYCLINE CALCIUM PO [id number] No Take 1 Capful by mouth in the morning and at bedtime. [provider] Taking Active   methylphenidate (METADATE ER) 20 MG ER tablet [id number]  Take 1 tablet (20 mg total) by mouth daily as needed. Tower, Audrie Gallus, MD  Active   oxyCODONE (ROXICODONE) 5 MG immediate release tablet [id number]  Take 1 tablet (5 mg total) by mouth every 4 (four) hours as needed for severe pain.  Achille Rich, PA-C  Active   pantoprazole (PROTONIX) 40 MG tablet [id number] No TAKE 1 TABLET BY MOUTH TWICE A DAY Tower, Audrie Gallus, MD Taking Active   rosuvastatin (CRESTOR) 40 MG tablet [id number] No TAKE 1 TABLET BY MOUTH EVERY DAY Tower, Audrie Gallus, MD Taking Active   sertraline (ZOLOFT) 50 MG tablet [id number]  TAKE 1 TABLET BY MOUTH EVERY DAY Tower, Audrie Gallus, MD  Active   sildenafil (REVATIO) 20 MG tablet [id number] No TAKE 3 TABLETS BY MOUTH 30 MINUTES BEFORE SEXUAL ACTIVITY AS NEEDED Tower, Audrie Gallus, MD Taking Active   Thiamine HCl (VITAMIN B-1 PO) [id number] No Take by mouth daily. [provider] Taking Active   Med List Note Patience Musca, LPN 14/43/15 4008): D she does not             Home Care and Equipment/Supplies: Were Home Health Services Ordered?: No Any new equipment or medical supplies ordered?: No  Functional Questionnaire: Do you need assistance with bathing/showering or dressing?: Yes Do you need assistance with meal preparation?: Yes Do you need assistance with eating?: Yes Do you have difficulty maintaining continence: Yes Do you need assistance with getting out of bed/getting out of a chair/moving?: Yes Do you have difficulty managing or taking your medications?: Yes  Follow up appointments reviewed: PCP Follow-up appointment confirmed?: Yes Date of PCP follow-up appointment?: 03/07/23 Follow-up Provider: [facility] Follow-up appointment confirmed?: NA Do you need transportation to your follow-up appointment?: No Do you understand care options if your condition(s) worsen?: Yes-patient verbalized understanding    SIGNATURE: Valentino Nose, RN

## 2023-03-04 NOTE — ED Provider Notes (Signed)
Pt left without being triaged   Bing Neighbors, NP 03/04/23 1059

## 2023-03-07 ENCOUNTER — Encounter: Payer: Self-pay | Admitting: Family Medicine

## 2023-03-07 ENCOUNTER — Ambulatory Visit: Payer: 59 | Admitting: Family Medicine

## 2023-03-07 VITALS — BP 130/86 | HR 58 | Temp 97.8°F | Ht <= 58 in | Wt 171.5 lb

## 2023-03-07 DIAGNOSIS — S62609A Fracture of unspecified phalanx of unspecified finger, initial encounter for closed fracture: Secondary | ICD-10-CM | POA: Insufficient documentation

## 2023-03-07 DIAGNOSIS — F418 Other specified anxiety disorders: Secondary | ICD-10-CM | POA: Diagnosis not present

## 2023-03-07 DIAGNOSIS — S62667D Nondisplaced fracture of distal phalanx of left little finger, subsequent encounter for fracture with routine healing: Secondary | ICD-10-CM

## 2023-03-07 NOTE — Patient Instructions (Addendum)
Continue the splint for now  Keep clean with soap and water   Dress with non stick pad and antibiotic ointment of choice and coban wrap   If any increase in pain / redness / swelling -call us asap or go to the ER  Follow up mid to late next week to remove sutures    I put the referral in for counseling  Please let us know if you don't hear in 1-2 weeks

## 2023-03-07 NOTE — Progress Notes (Unsigned)
Subjective:    Patient ID: Benjamin Bullock, male    DOB: June 30, 1951, 72 y.o.   MRN: [id number]  HPI  Wt Readings from Last 3 Encounters:  03/07/23 171 lb 8 oz (77.8 kg)  02/27/23 178 lb (80.7 kg)  01/02/23 174 lb 4 oz (79 kg)   837.34 kg/m  Vitals:   03/07/23 1030  BP: 130/86  Pulse: (!) 58  Temp: 97.8 F (36.6 C)  SpO2: 95%    Pt presents for follow up of hand injury Also interested in counseling for worsened anxiety  Pt presentf for follow up of hand injury Was seen on 8/8 at ER Slammed 5th finger in car door that day  Has possible tuft fracture of that digit  Required a laceration repair - 5 simple interrupted sutures  Given 2 g of Ancef IM Placed in aluminum splint   Was prescription cefadoxil 500 mg bid for 7 d  Oxycodone prn   DG Hand Complete Left  Addendum Date: 02/27/2023   ADDENDUM REPORT: 02/27/2023 21:24 ADDENDUM: Possible cortical irregularity along the base of the tuft of the distal phalanx of the fifth digit in the possibility of nondisplaced fracture can not be excluded. Findings were discussed with PA Ranson at 9:23 p.m. Electronically Signed   By: Thornell Sartorius M.D.   On: 02/27/2023 21:24   Result Date: 02/27/2023 CLINICAL DATA:  Left fourth finger injury. EXAM: LEFT HAND - COMPLETE 3+ VIEW COMPARISON:  None Available. FINDINGS: There is no evidence of fracture or dislocation. There is no evidence of arthropathy or other focal bone abnormality. Bandage material is present over the fourth and fifth digits. IMPRESSION: No acute fracture or dislocation. Electronically Signed: By: Thornell Sartorius M.D. On: 02/27/2023 19:13     Has been careful  Wearing splint  Wife helps change dressing  Is sore / occational a little throbbing   Only took one of pain pill   Swelling and redness improved    Patient Active Problem List   Diagnosis Date Noted   Finger fracture, left 03/07/2023   Elevated BP without diagnosis of hypertension 11/19/2021   Encounter for  hepatitis C screening test for low risk patient 05/28/2021   Vitamin D deficiency 05/28/2021   Diverticulosis 05/19/2020   Lower abdominal pain 08/18/2018   Prediabetes 05/22/2017   B12 deficiency 04/22/2017   Current use of proton pump inhibitor 04/15/2016   Fatigue 11/06/2014   Anxiety 08/20/2013   Colon cancer screening 03/31/2012   Allergic drug reaction 01/29/2012   Back pain 02/05/2011   Routine general medical examination at a health care facility 11/08/2010   Prostate cancer screening 11/08/2010   GERD 09/20/2009   ANXIETY STATE NOS 01/08/2007   Attention deficit disorder 01/08/2007   Hyperlipidemia 01/07/2007   ERECTILE DYSFUNCTION 01/07/2007   Depression with anxiety 01/07/2007   Allergic rhinitis 01/07/2007   Past Medical History:  Diagnosis Date   ADD (attention deficit disorder)    Allergy    allergic rhinitis   Anxiety    Bursitis of hip, right    Depression    GERD (gastroesophageal reflux disease)    History of meniscal tear    left knee meniscal tear   Hyperlipidemia    Past Surgical History:  Procedure Laterality Date   COLONOSCOPY     Social History   Tobacco Use   Smoking status: Former    Current packs/day: 0.00    Types: Cigarettes    Quit date: 1978    Years  since quitting: 46.6   Smokeless tobacco: Never   Tobacco comments:    Smoked for about one year 30 years ago  Vaping Use   Vaping status: Never Used  Substance Use Topics   Alcohol use: Yes    Alcohol/week: 0.0 standard drinks of alcohol    Comment: occ   Drug use: Never   Family History  Problem Relation Age of Onset   Heart disease Father        CAD   Hypertension Mother    Depression Mother    Heart disease Paternal Uncle        CAD   COPD Maternal Grandfather    Colon cancer Neg Hx    Colon polyps Neg Hx    Rectal cancer Neg Hx    Stomach cancer Neg Hx    Esophageal cancer Neg Hx    Allergies  Allergen Reactions   Aleve [Naproxen Sodium] Swelling    Face and  lips swollen and hives   Current Outpatient Medications on File Prior to Visit  Medication Sig Dispense Refill   Acetaminophen (TYLENOL PO) Take by mouth as needed.     cefadroxil (DURICEF) 500 MG capsule Take 1 capsule (500 mg total) by mouth 2 (two) times daily. 14 capsule 0   Cholecalciferol (VITAMIN D3) 50 MCG (2000 UT) capsule Take 4,000 Units by mouth daily.     cyanocobalamin (VITAMIN B12) 1000 MCG tablet Take 1,000 mcg by mouth daily.     DOXYCYCLINE CALCIUM PO Take 1 capsule by mouth in the morning and at bedtime.     methylphenidate (METADATE ER) 20 MG ER tablet Take 1 tablet (20 mg total) by mouth daily as needed. 30 tablet 0   oxyCODONE (ROXICODONE) 5 MG immediate release tablet Take 1 tablet (5 mg total) by mouth every 4 (four) hours as needed for severe pain. 3 tablet 0   pantoprazole (PROTONIX) 40 MG tablet TAKE 1 TABLET BY MOUTH TWICE A DAY 180 tablet 1   rosuvastatin (CRESTOR) 40 MG tablet TAKE 1 TABLET BY MOUTH EVERY DAY 90 tablet 2   sertraline (ZOLOFT) 50 MG tablet TAKE 1 TABLET BY MOUTH EVERY DAY 90 tablet 3   sildenafil (REVATIO) 20 MG tablet TAKE 3 TABLETS BY MOUTH 30 MINUTES BEFORE SEXUAL ACTIVITY AS NEEDED 30 tablet 5   Thiamine HCl (VITAMIN B-1 PO) Take by mouth daily.     No current facility-administered medications on file prior to visit.    Review of Systems  Constitutional:  Negative for activity change, appetite change, fatigue, fever and unexpected weight change.  HENT:  Negative for congestion, rhinorrhea, sore throat and trouble swallowing.   Eyes:  Negative for pain, redness, itching and visual disturbance.  Respiratory:  Negative for cough, chest tightness, shortness of breath and wheezing.   Cardiovascular:  Negative for chest pain and palpitations.  Gastrointestinal:  Negative for abdominal pain, blood in stool, constipation, diarrhea and nausea.  Endocrine: Negative for cold intolerance, heat intolerance, polydipsia and polyuria.  Genitourinary:   Negative for difficulty urinating, dysuria, frequency and urgency.  Musculoskeletal:  Negative for arthralgias, joint swelling and myalgias.       Finger injury  Skin:  Negative for pallor and rash.  Neurological:  Negative for dizziness, tremors, weakness, numbness and headaches.  Hematological:  Negative for adenopathy. Does not bruise/bleed easily.  Psychiatric/Behavioral:  Positive for sleep disturbance. Negative for decreased concentration and dysphoric mood. The patient is nervous/anxious.        Objective:  Physical Exam Constitutional:      General: He is not in acute distress.    Appearance: Normal appearance. He is normal weight. He is not ill-appearing or diaphoretic.  Eyes:     Conjunctiva/sclera: Conjunctivae normal.     Pupils: Pupils are equal, round, and reactive to light.  Cardiovascular:     Rate and Rhythm: Regular rhythm. Bradycardia present.     Heart sounds: Normal heart sounds.  Pulmonary:     Effort: Pulmonary effort is normal. No respiratory distress.  Lymphadenopathy:     Cervical: No cervical adenopathy.  Skin:    Coloration: Skin is not pale.     Findings: No erythema.     Comments: Left 5th finger  1.5 cm laceration on palmar side of distal finger with sutures -wound edges clean and well approximated No redness or swelling or drainage Mildly tender  Normal rom of finger   Replaced non stick pad/splint and coban today  Neurological:     Mental Status: He is alert.     Sensory: No sensory deficit.     Motor: No weakness.  Psychiatric:        Mood and Affect: Mood is anxious.     Comments: Candidly discusses symptoms and stressors             Assessment & Plan:   Problem List Items Addressed This Visit       Musculoskeletal and Integument   Finger fracture, left    Left 5th finger injury (lac and tuft fracture) S/p lac repair, wearing a splint  Reviewed hospital records, lab results and studies in detail   Doing well overall   Sutures look good / no signs of infection and wound edges are coming together  Not ready for removal   Finger re dressed with non stick pad/ splint and coban today  Plan return next week for re check and suture removal Discussed wound care Instructed to continue splint for protection   Call back and Er precautions noted in detail today          Other   Depression with anxiety - Primary    Still struggling with anxiety in setting of stressors   Continues sertraline 50 mg daily which has helped Interested in mental health counseling-referral done   Overall needs more time for self care and sleep      Relevant Orders   Ambulatory referral to Psychology

## 2023-03-09 NOTE — Assessment & Plan Note (Signed)
Still struggling with anxiety in setting of stressors   Continues sertraline 50 mg daily which has helped Interested in mental health counseling-referral done   Overall needs more time for self care and sleep

## 2023-03-09 NOTE — Assessment & Plan Note (Signed)
Left 5th finger injury (lac and tuft fracture) S/p lac repair, wearing a splint  Reviewed hospital records, lab results and studies in detail   Doing well overall  Sutures look good / no signs of infection and wound edges are coming together  Not ready for removal   Finger re dressed with non stick pad/ splint and coban today  Plan return next week for re check and suture removal Discussed wound care Instructed to continue splint for protection   Call back and Er precautions noted in detail today

## 2023-03-13 ENCOUNTER — Encounter: Payer: Self-pay | Admitting: Family Medicine

## 2023-03-13 ENCOUNTER — Ambulatory Visit: Payer: 59 | Admitting: Family Medicine

## 2023-03-13 VITALS — BP 110/68 | HR 60 | Temp 98.0°F | Ht 67.0 in | Wt 170.5 lb

## 2023-03-13 DIAGNOSIS — S62667D Nondisplaced fracture of distal phalanx of left little finger, subsequent encounter for fracture with routine healing: Secondary | ICD-10-CM | POA: Diagnosis not present

## 2023-03-13 DIAGNOSIS — S61219A Laceration without foreign body of unspecified finger without damage to nail, initial encounter: Secondary | ICD-10-CM | POA: Insufficient documentation

## 2023-03-13 DIAGNOSIS — S61217D Laceration without foreign body of left little finger without damage to nail, subsequent encounter: Secondary | ICD-10-CM

## 2023-03-13 NOTE — Progress Notes (Signed)
Subjective:    Patient ID: Benjamin Bullock, male    DOB: 09-Feb-1951, 72 y.o.   MRN: [id number]  HPI  Wt Readings from Last 3 Encounters:  03/13/23 170 lb 8 oz (77.3 kg)  03/07/23 171 lb 8 oz (77.8 kg)  02/27/23 178 lb (80.7 kg)   26.70 kg/m  Vitals:   03/13/23 0904  BP: 110/68  Pulse: 60  Temp: 98 F (36.7 C)  SpO2: 94%    Here for suture removal/finger  Seen last time/healing was good (also tuft fracture)  Got rid of splint-did not feel he needed it   Doing well  Wife has been dressing it  No pain   Anxiety is the same Got a call from [facility]-plans to call back and schedule counseling    Patient Active Problem List   Diagnosis Date Noted   Finger laceration 03/13/2023   Finger fracture, left 03/07/2023   Elevated BP without diagnosis of hypertension 11/19/2021   Encounter for hepatitis C screening test for low risk patient 05/28/2021   Vitamin D deficiency 05/28/2021   Diverticulosis 05/19/2020   Lower abdominal pain 08/18/2018   Prediabetes 05/22/2017   B12 deficiency 04/22/2017   Current use of proton pump inhibitor 04/15/2016   Fatigue 11/06/2014   Anxiety 08/20/2013   Colon cancer screening 03/31/2012   Allergic drug reaction 01/29/2012   Back pain 02/05/2011   Routine general medical examination at a health care facility 11/08/2010   Prostate cancer screening 11/08/2010   GERD 09/20/2009   ANXIETY STATE NOS 01/08/2007   Attention deficit disorder 01/08/2007   Hyperlipidemia 01/07/2007   ERECTILE DYSFUNCTION 01/07/2007   Depression with anxiety 01/07/2007   Allergic rhinitis 01/07/2007   Past Medical History:  Diagnosis Date   ADD (attention deficit disorder)    Allergy    allergic rhinitis   Anxiety    Bursitis of hip, right    Depression    GERD (gastroesophageal reflux disease)    History of meniscal tear    left knee meniscal tear   Hyperlipidemia    Past Surgical History:  Procedure Laterality Date   COLONOSCOPY     Social  History   Tobacco Use   Smoking status: Former    Current packs/day: 0.00    Types: Cigarettes    Quit date: 1978    Years since quitting: 46.6   Smokeless tobacco: Never   Tobacco comments:    Smoked for about one year 30 years ago  Vaping Use   Vaping status: Never Used  Substance Use Topics   Alcohol use: Yes    Alcohol/week: 0.0 standard drinks of alcohol    Comment: occ   Drug use: Never   Family History  Problem Relation Age of Onset   Heart disease Father        CAD   Hypertension Mother    Depression Mother    Heart disease Paternal Uncle        CAD   COPD Maternal Grandfather    Colon cancer Neg Hx    Colon polyps Neg Hx    Rectal cancer Neg Hx    Stomach cancer Neg Hx    Esophageal cancer Neg Hx    Allergies  Allergen Reactions   Aleve [Naproxen Sodium] Swelling    Face and lips swollen and hives   Current Outpatient Medications on File Prior to Visit  Medication Sig Dispense Refill   Acetaminophen (TYLENOL PO) Take by mouth as needed.  cefadroxil (DURICEF) 500 MG capsule Take 1 capsule (500 mg total) by mouth 2 (two) times daily. 14 capsule 0   Cholecalciferol (VITAMIN D3) 50 MCG (2000 UT) capsule Take 4,000 Units by mouth daily.     cyanocobalamin (VITAMIN B12) 1000 MCG tablet Take 1,000 mcg by mouth daily.     DOXYCYCLINE CALCIUM PO Take 1 capsule by mouth in the morning and at bedtime.     methylphenidate (METADATE ER) 20 MG ER tablet Take 1 tablet (20 mg total) by mouth daily as needed. 30 tablet 0   oxyCODONE (ROXICODONE) 5 MG immediate release tablet Take 1 tablet (5 mg total) by mouth every 4 (four) hours as needed for severe pain. 3 tablet 0   pantoprazole (PROTONIX) 40 MG tablet TAKE 1 TABLET BY MOUTH TWICE A DAY 180 tablet 1   rosuvastatin (CRESTOR) 40 MG tablet TAKE 1 TABLET BY MOUTH EVERY DAY 90 tablet 2   sertraline (ZOLOFT) 50 MG tablet TAKE 1 TABLET BY MOUTH EVERY DAY 90 tablet 3   sildenafil (REVATIO) 20 MG tablet TAKE 3 TABLETS BY  MOUTH 30 MINUTES BEFORE SEXUAL ACTIVITY AS NEEDED 30 tablet 5   Thiamine HCl (VITAMIN B-1 PO) Take by mouth daily.     No current facility-administered medications on file prior to visit.    Review of Systems  Constitutional:  Negative for activity change, appetite change, fatigue, fever and unexpected weight change.  HENT:  Negative for congestion, rhinorrhea, sore throat and trouble swallowing.   Eyes:  Negative for pain, redness, itching and visual disturbance.  Respiratory:  Negative for cough, chest tightness, shortness of breath and wheezing.   Cardiovascular:  Negative for chest pain and palpitations.  Gastrointestinal:  Negative for abdominal pain, blood in stool, constipation, diarrhea and nausea.  Endocrine: Negative for cold intolerance, heat intolerance, polydipsia and polyuria.  Genitourinary:  Negative for difficulty urinating, dysuria, frequency and urgency.  Musculoskeletal:  Negative for arthralgias, joint swelling and myalgias.       Finger pain is improved  Skin:  Positive for wound. Negative for pallor and rash.       Wound looks and feels better  Neurological:  Negative for dizziness, tremors, weakness, numbness and headaches.  Hematological:  Negative for adenopathy. Does not bruise/bleed easily.  Psychiatric/Behavioral:  Negative for decreased concentration and dysphoric mood. The patient is nervous/anxious.        Objective:   Physical Exam Constitutional:      General: He is not in acute distress.    Appearance: Normal appearance. He is normal weight. He is not ill-appearing or diaphoretic.  Eyes:     Conjunctiva/sclera: Conjunctivae normal.     Pupils: Pupils are equal, round, and reactive to light.  Cardiovascular:     Rate and Rhythm: Regular rhythm. Bradycardia present.     Heart sounds: Normal heart sounds.  Pulmonary:     Effort: Pulmonary effort is normal. No respiratory distress.  Lymphadenopathy:     Cervical: No cervical adenopathy.  Skin:     Coloration: Skin is not pale.     Findings: No erythema.     Comments: Left 5th finger  1.5 cm laceration on palmar side of distal finger with sutures -wound edges clean and well approximated No redness or swelling or drainage Non tender Normal rom of finger  Normal perfusion and sensation   After consent obt Area cleaned with alcohol pad/ 5 simple interrupted sutures removed without discomfort or difficulty  Steri strips applied (3) Wrapped loosely  with coban   Neurological:     Mental Status: He is alert.     Sensory: No sensory deficit.     Motor: No weakness.  Psychiatric:        Mood and Affect: Mood is anxious.     Comments: Candidly discusses symptoms and stressors -mildly anxoius  Plans to call [facility] back to schedule counseling            Assessment & Plan:   Problem List Items Addressed This Visit       Musculoskeletal and Integument   Finger fracture, left - Primary    Stopped wearing splint-disliked it  Encouraged to protect this from trauma  Wound is healed and no longer painful   Sutures removed today w/o problem  Discussed wound care         Other   Finger laceration    Again reviewed ER notes   Today wound appears healed with good wound edge approx No signs and symptoms of infection   5 simple interrupted sutures removed Steri strips applied  Loose coban applied for protection  Update if not starting to improve in a week or if worsening  Call back and Er precautions noted in detail today    See AVS for wound care

## 2023-03-13 NOTE — Assessment & Plan Note (Signed)
Again reviewed ER notes   Today wound appears healed with good wound edge approx No signs and symptoms of infection   5 simple interrupted sutures removed Steri strips applied  Loose coban applied for protection  Update if not starting to improve in a week or if worsening  Call back and Er precautions noted in detail today    See AVS for wound care

## 2023-03-13 NOTE — Patient Instructions (Signed)
Cover your finger lightly if you know you will be exposed to dirt  Don't submerge finger in tub/ocean/hot tub /sink/pool for 1-2 weeks   Keep clean with soap and water  Wash gently  Shower is ok   Protect from trauma   The steri strips will come off when they are ready    Watch for increased redness /pain/swelling or drainage Keep [place] posted   Call the number back about mental health counseling

## 2023-03-13 NOTE — Assessment & Plan Note (Signed)
Stopped wearing splint-disliked it  Encouraged to protect this from trauma  Wound is healed and no longer painful   Sutures removed today w/o problem  Discussed wound care

## 2023-05-10 ENCOUNTER — Other Ambulatory Visit: Payer: Self-pay | Admitting: Family Medicine

## 2023-05-21 ENCOUNTER — Ambulatory Visit (INDEPENDENT_AMBULATORY_CARE_PROVIDER_SITE_OTHER): Payer: 59 | Admitting: Family Medicine

## 2023-05-21 ENCOUNTER — Encounter: Payer: Self-pay | Admitting: Family Medicine

## 2023-05-21 VITALS — BP 122/80 | HR 61 | Temp 98.4°F | Ht 67.0 in | Wt 170.0 lb

## 2023-05-21 DIAGNOSIS — Z23 Encounter for immunization: Secondary | ICD-10-CM | POA: Diagnosis not present

## 2023-05-21 DIAGNOSIS — J069 Acute upper respiratory infection, unspecified: Secondary | ICD-10-CM | POA: Diagnosis not present

## 2023-05-21 MED ORDER — BENZONATATE 200 MG PO CAPS: 200.0000 mg | ORAL_CAPSULE | Freq: Three times a day (TID) | ORAL | 1 refills | Status: DC | PRN

## 2023-05-21 NOTE — Patient Instructions (Addendum)
Try the tessalon for cough - three times daily as needed  Swallow the pill whole   For chest congestion- mucinex (plain) can help loosen it (only if needed) This is over the counter   Your exam is reassuring today   Drink lots of fluids Get extra rest as long as you can   Update if not starting to improve in a week or if worsening    If any sudden trouble breathing or high fever-go to the ER  Flu shot today

## 2023-05-21 NOTE — Progress Notes (Signed)
Subjective:    Patient ID: Benjamin Bullock, male    DOB: 03/15/1951, 72 y.o.   MRN: [id number]  HPI  Wt Readings from Last 3 Encounters:  05/21/23 170 lb (77.1 kg)  03/13/23 170 lb 8 oz (77.3 kg)  03/07/23 171 lb 8 oz (77.8 kg)   26.63 kg/m  Vitals:   05/21/23 1201 05/21/23 1234  BP: (!) 136/92 122/80  Pulse: 61   Temp: 98.4 F (36.9 C)   SpO2: 95%     Here for cough and congestion for 3 weeks   Started as a head cold  Then symptoms settled in chest  Sounds a bit better   Some residual cough now - mostly during the day  Phlegm is light yellow  No rattling  Some fatigue  Feels run down   No wheezing  No shortness of breath   No longer has a headache  Throat is dry /not sore  Ears are ok   No additional nasal symptoms than just allergies   No fever at all   Wants flu shot   Never did a covid test   Over the counter  Tylenol - for hip (unrelated to the cold)  No cough medicines at all     In distant past nyquil raised blood pressure    Patient Active Problem List   Diagnosis Date Noted   Viral URI with cough 05/21/2023   Finger laceration 03/13/2023   Finger fracture, left 03/07/2023   Elevated BP without diagnosis of hypertension 11/19/2021   Encounter for hepatitis C screening test for low risk patient 05/28/2021   Vitamin D deficiency 05/28/2021   Diverticulosis 05/19/2020   Lower abdominal pain 08/18/2018   Prediabetes 05/22/2017   B12 deficiency 04/22/2017   Current use of proton pump inhibitor 04/15/2016   Fatigue 11/06/2014   Anxiety 08/20/2013   Colon cancer screening 03/31/2012   Allergic drug reaction 01/29/2012   Back pain 02/05/2011   Routine general medical examination at a health care facility 11/08/2010   Prostate cancer screening 11/08/2010   GERD 09/20/2009   ANXIETY STATE NOS 01/08/2007   Attention deficit disorder 01/08/2007   Hyperlipidemia 01/07/2007   ERECTILE DYSFUNCTION 01/07/2007   Depression with anxiety  01/07/2007   Allergic rhinitis 01/07/2007   Past Medical History:  Diagnosis Date   ADD (attention deficit disorder)    Allergy    allergic rhinitis   Anxiety    Bursitis of hip, right    Depression    GERD (gastroesophageal reflux disease)    History of meniscal tear    left knee meniscal tear   Hyperlipidemia    Past Surgical History:  Procedure Laterality Date   COLONOSCOPY     Social History   Tobacco Use   Smoking status: Former    Current packs/day: 0.00    Types: Cigarettes    Quit date: 1978    Years since quitting: 46.8   Smokeless tobacco: Never   Tobacco comments:    Smoked for about one year 30 years ago  Vaping Use   Vaping status: Never Used  Substance Use Topics   Alcohol use: Yes    Alcohol/week: 0.0 standard drinks of alcohol    Comment: occ   Drug use: Never   Family History  Problem Relation Age of Onset   Heart disease Father        CAD   Hypertension Mother    Depression Mother    Heart disease Paternal Uncle  CAD   COPD Maternal Grandfather    Colon cancer Neg Hx    Colon polyps Neg Hx    Rectal cancer Neg Hx    Stomach cancer Neg Hx    Esophageal cancer Neg Hx    Allergies  Allergen Reactions   Aleve [Naproxen Sodium] Swelling    Face and lips swollen and hives   Current Outpatient Medications on File Prior to Visit  Medication Sig Dispense Refill   Acetaminophen (TYLENOL PO) Take by mouth as needed.     Cholecalciferol (VITAMIN D3) 50 MCG (2000 UT) capsule Take 4,000 Units by mouth daily.     cyanocobalamin (VITAMIN B12) 1000 MCG tablet Take 1,000 mcg by mouth daily.     methylphenidate (METADATE ER) 20 MG ER tablet Take 1 tablet (20 mg total) by mouth daily as needed. 30 tablet 0   pantoprazole (PROTONIX) 40 MG tablet TAKE 1 TABLET BY MOUTH TWICE A DAY 180 tablet 1   rosuvastatin (CRESTOR) 40 MG tablet TAKE 1 TABLET BY MOUTH EVERY DAY 90 tablet 2   sertraline (ZOLOFT) 50 MG tablet TAKE 1 TABLET BY MOUTH EVERY DAY 90  tablet 3   sildenafil (REVATIO) 20 MG tablet TAKE 3 TABLETS BY MOUTH 30 MINUTES BEFORE SEXUAL ACTIVITY AS NEEDED 30 tablet 5   Thiamine HCl (VITAMIN B-1 PO) Take by mouth daily.     No current facility-administered medications on file prior to visit.    Review of Systems  Constitutional:  Positive for fatigue. Negative for appetite change and fever.  HENT:  Positive for postnasal drip, rhinorrhea and sneezing. Negative for congestion, ear pain, sinus pressure and sore throat.   Eyes:  Negative for pain and discharge.  Respiratory:  Positive for cough. Negative for shortness of breath, wheezing and stridor.   Cardiovascular:  Negative for chest pain.  Gastrointestinal:  Negative for diarrhea, nausea and vomiting.  Genitourinary:  Negative for frequency, hematuria and urgency.  Musculoskeletal:  Negative for arthralgias and myalgias.  Skin:  Negative for rash.  Neurological:  Negative for dizziness, weakness, light-headedness and headaches.  Psychiatric/Behavioral:  Negative for confusion and dysphoric mood.        Objective:   Physical Exam Constitutional:      General: He is not in acute distress.    Appearance: Normal appearance. He is well-developed and normal weight. He is not ill-appearing, toxic-appearing or diaphoretic.  HENT:     Head: Normocephalic and atraumatic.     Comments: Nares are injected and congested      Right Ear: Tympanic membrane, ear canal and external ear normal.     Left Ear: Tympanic membrane, ear canal and external ear normal.     Nose: Congestion and rhinorrhea present.     Mouth/Throat:     Mouth: Mucous membranes are moist.     Pharynx: Oropharynx is clear. No oropharyngeal exudate or posterior oropharyngeal erythema.     Comments: Clear pnd  Eyes:     General:        Right eye: No discharge.        Left eye: No discharge.     Conjunctiva/sclera: Conjunctivae normal.     Pupils: Pupils are equal, round, and reactive to light.  Cardiovascular:      Rate and Rhythm: Normal rate.     Heart sounds: Normal heart sounds.  Pulmonary:     Effort: Pulmonary effort is normal. No respiratory distress.     Breath sounds: Normal breath sounds. No stridor. No  wheezing, rhonchi or rales.     Comments: Good air exch Cough sounds mildly productive  No shortness of breath with speech No rales  Chest:     Chest wall: No tenderness.  Musculoskeletal:     Cervical back: Normal range of motion and neck supple.  Lymphadenopathy:     Cervical: No cervical adenopathy.  Skin:    General: Skin is warm and dry.     Capillary Refill: Capillary refill takes less than 2 seconds.     Findings: No rash.  Neurological:     Mental Status: He is alert.     Cranial Nerves: No cranial nerve deficit.  Psychiatric:        Mood and Affect: Mood normal.           Assessment & Plan:   Problem List Items Addressed This Visit       Respiratory   Viral URI with cough - Primary    For 3 weeks with persistent cough  Reassuring exam No wheeze or shortness of breath  Discussed symptom care  See AVS Tessalon sent to pharmacy  Update if not starting to improve in a week or if worsening  Call back and Er precautions noted in detail today

## 2023-05-21 NOTE — Addendum Note (Signed)
Addended by: Shon Millet on: 05/21/2023 02:05 PM   Modules accepted: Orders

## 2023-05-21 NOTE — Assessment & Plan Note (Signed)
For 3 weeks with persistent cough  Reassuring exam No wheeze or shortness of breath  Discussed symptom care  See AVS Tessalon sent to pharmacy  Update if not starting to improve in a week or if worsening  Call back and Er precautions noted in detail today

## 2023-05-25 ENCOUNTER — Telehealth: Payer: Self-pay | Admitting: Family Medicine

## 2023-05-25 DIAGNOSIS — Z125 Encounter for screening for malignant neoplasm of prostate: Secondary | ICD-10-CM

## 2023-05-25 DIAGNOSIS — E559 Vitamin D deficiency, unspecified: Secondary | ICD-10-CM

## 2023-05-25 DIAGNOSIS — E7800 Pure hypercholesterolemia, unspecified: Secondary | ICD-10-CM

## 2023-05-25 DIAGNOSIS — F419 Anxiety disorder, unspecified: Secondary | ICD-10-CM

## 2023-05-25 DIAGNOSIS — R7309 Other abnormal glucose: Secondary | ICD-10-CM | POA: Insufficient documentation

## 2023-05-25 DIAGNOSIS — K219 Gastro-esophageal reflux disease without esophagitis: Secondary | ICD-10-CM

## 2023-05-25 NOTE — Telephone Encounter (Signed)
-----   Message from Vincenza Hews sent at 05/14/2023  1:50 PM EDT ----- Regarding: Lab Mon. 05/26/23 Hello,   Patient has a lab appointment on Monday 05/26/23 for AWV. Can we get lab orders please.   Thanks

## 2023-05-26 ENCOUNTER — Other Ambulatory Visit (INDEPENDENT_AMBULATORY_CARE_PROVIDER_SITE_OTHER): Payer: 59

## 2023-05-26 DIAGNOSIS — E559 Vitamin D deficiency, unspecified: Secondary | ICD-10-CM

## 2023-05-26 DIAGNOSIS — R7309 Other abnormal glucose: Secondary | ICD-10-CM | POA: Diagnosis not present

## 2023-05-26 DIAGNOSIS — E7800 Pure hypercholesterolemia, unspecified: Secondary | ICD-10-CM | POA: Diagnosis not present

## 2023-05-26 DIAGNOSIS — Z125 Encounter for screening for malignant neoplasm of prostate: Secondary | ICD-10-CM

## 2023-05-26 LAB — LIPID PANEL
Cholesterol: 182 mg/dL (ref 0–200)
HDL: 62.3 mg/dL (ref >39.00)
LDL Cholesterol: 103 mg/dL — ABNORMAL HIGH (ref 0–99)
NonHDL: 119.41
Total CHOL/HDL Ratio: 3
Triglycerides: 81 mg/dL (ref 0.0–149.0)
VLDL: 16.2 mg/dL (ref 0.0–40.0)

## 2023-05-26 LAB — COMPREHENSIVE METABOLIC PANEL
ALT: 24 U/L (ref 0–53)
AST: 22 U/L (ref 0–37)
Albumin: 4.4 g/dL (ref 3.5–5.2)
Alkaline Phosphatase: 47 U/L (ref 39–117)
BUN: 15 mg/dL (ref 6–23)
CO2: 31 meq/L (ref 19–32)
Calcium: 9.4 mg/dL (ref 8.4–10.5)
Chloride: 100 meq/L (ref 96–112)
Creatinine, Ser: 0.88 mg/dL (ref 0.40–1.50)
GFR: 84.54 mL/min (ref >60.00)
Glucose, Bld: 105 mg/dL — ABNORMAL HIGH (ref 70–99)
Potassium: 4.3 meq/L (ref 3.5–5.1)
Sodium: 138 meq/L (ref 135–145)
Total Bilirubin: 0.9 mg/dL (ref 0.2–1.2)
Total Protein: 6.6 g/dL (ref 6.0–8.3)

## 2023-05-26 LAB — HEMOGLOBIN A1C: Hgb A1c MFr Bld: 6.1 % (ref 4.6–6.5)

## 2023-05-26 LAB — VITAMIN D 25 HYDROXY (VIT D DEFICIENCY, FRACTURES): VITD: 27.48 ng/mL — ABNORMAL LOW (ref 30.00–100.00)

## 2023-05-26 LAB — PSA, MEDICARE: PSA: 2.65 ng/mL (ref 0.10–4.00)

## 2023-06-02 ENCOUNTER — Encounter: Payer: Self-pay | Admitting: Family Medicine

## 2023-06-02 ENCOUNTER — Ambulatory Visit (INDEPENDENT_AMBULATORY_CARE_PROVIDER_SITE_OTHER): Payer: 59 | Admitting: Family Medicine

## 2023-06-02 VITALS — BP 116/68 | HR 59 | Temp 98.5°F | Ht 66.0 in | Wt 168.0 lb

## 2023-06-02 DIAGNOSIS — R7303 Prediabetes: Secondary | ICD-10-CM | POA: Diagnosis not present

## 2023-06-02 DIAGNOSIS — E538 Deficiency of other specified B group vitamins: Secondary | ICD-10-CM

## 2023-06-02 DIAGNOSIS — E7800 Pure hypercholesterolemia, unspecified: Secondary | ICD-10-CM | POA: Diagnosis not present

## 2023-06-02 DIAGNOSIS — Z0000 Encounter for general adult medical examination without abnormal findings: Secondary | ICD-10-CM

## 2023-06-02 DIAGNOSIS — R41840 Attention and concentration deficit: Secondary | ICD-10-CM

## 2023-06-02 DIAGNOSIS — Z1211 Encounter for screening for malignant neoplasm of colon: Secondary | ICD-10-CM

## 2023-06-02 DIAGNOSIS — F418 Other specified anxiety disorders: Secondary | ICD-10-CM

## 2023-06-02 DIAGNOSIS — Z125 Encounter for screening for malignant neoplasm of prostate: Secondary | ICD-10-CM

## 2023-06-02 DIAGNOSIS — Z79899 Other long term (current) drug therapy: Secondary | ICD-10-CM

## 2023-06-02 DIAGNOSIS — E559 Vitamin D deficiency, unspecified: Secondary | ICD-10-CM

## 2023-06-02 DIAGNOSIS — K219 Gastro-esophageal reflux disease without esophagitis: Secondary | ICD-10-CM

## 2023-06-02 MED ORDER — PANTOPRAZOLE SODIUM 40 MG PO TBEC: 40.0000 mg | DELAYED_RELEASE_TABLET | Freq: Every day | ORAL | 3 refills | Status: DC

## 2023-06-02 MED ORDER — SERTRALINE HCL 50 MG PO TABS: 50.0000 mg | ORAL_TABLET | Freq: Every day | ORAL | 3 refills | Status: DC

## 2023-06-02 MED ORDER — ROSUVASTATIN CALCIUM 40 MG PO TABS: 40.0000 mg | ORAL_TABLET | Freq: Every day | ORAL | 3 refills | Status: DC

## 2023-06-02 NOTE — Assessment & Plan Note (Signed)
Continues methylphenidate ER 20 mg on days he needs it

## 2023-06-02 NOTE — Assessment & Plan Note (Signed)
Colonoscopy 08/2022 with 3 y recall

## 2023-06-02 NOTE — Assessment & Plan Note (Signed)
Was able to cut protonix to every day from bid  Watching diet

## 2023-06-02 NOTE — Patient Instructions (Addendum)
If you are interested in the new shingles vaccine (Shingrix) - call your local pharmacy to check on coverage and availability   You can get a covid shot at your pharmacy   I would like to re check psa in 6 months   Keep walking  Add some strength training to your routine, this is important for bone and brain health and can reduce your risk of falls and help your body use insulin properly and regulate weight  Light weights, exercise bands , and internet videos are a good way to start  Yoga (chair or regular), machines , floor exercises or a gym with machines are also good options   Your vitamin D level is low  Make sure you get at least 4000 international units of vitamin D3 daily  Try not to miss doses if you can    To prevent diabetes Try to get most of your carbohydrates from produce (with the exception of white potatoes) and whole grains Eat less bread/pasta/rice/snack foods/cereals/sweets and other items from the middle of the grocery store (processed carbs)

## 2023-06-02 NOTE — Assessment & Plan Note (Signed)
Sildenafil prn 

## 2023-06-02 NOTE — Progress Notes (Signed)
Subjective:    Patient ID: Benjamin Bullock, male    DOB: 03/16/1951, 72 y.o.   MRN: [id number]  HPI  Here for health maintenance exam and to review chronic medical problems   Wt Readings from Last 3 Encounters:  06/02/23 168 lb (76.2 kg)  05/21/23 170 lb (77.1 kg)  03/13/23 170 lb 8 oz (77.3 kg)   27.12 kg/m  Vitals:   06/02/23 0855  BP: 116/68  Pulse: (!) 59  Temp: 98.5 F (36.9 C)  SpO2: 95%    Immunization History  Administered Date(s) Administered   Fluad Quad(high Dose 65+) 05/04/2019, 05/04/2021, 05/25/2022   Fluad Trivalent(High Dose 65+) 05/21/2023   Influenza Split 03/31/2012   Influenza Whole 06/24/2006, 05/29/2007, 04/19/2008   Influenza, High Dose Seasonal PF 04/21/2020   Influenza,inj,Quad PF,6+ Mos 05/12/2013, 04/04/2014, 04/10/2015, 04/15/2016, 04/22/2017, 05/01/2018   PFIZER Comirnaty(Gray Top)Covid-19 Tri-Sucrose Vaccine 10/21/2020   PFIZER(Purple Top)SARS-COV-2 Vaccination 08/08/2019, 08/28/2019, 04/21/2020   Pfizer Covid-19 Vaccine Bivalent Booster 47yrs & up 06/02/2021   Pfizer(Comirnaty)Fall Seasonal Vaccine 12 years and older 05/25/2022   Pneumococcal Conjugate-13 04/10/2015   Pneumococcal Polysaccharide-23 04/04/2014   Td 10/05/2004, 05/30/2022   Tdap 04/17/2011   Zoster, Live 06/16/2013    There are no preventive care reminders to display for this patient.  Recent cold is gone Feeling better   Shingrix - may be interested later     Prostate health Lab Results  Component Value Date   PSA 2.65 05/26/2023   PSA 1.25 05/23/2022   PSA 1.76 05/28/2021  No change in urinary habits in the past 10 years Nocturia times 2  Drinks a lot of water   No family history of prostate cancer    Sildenafil for ED  Colon cancer screening  Colonoscopy 08/2022 with 3 y recall   Bone health   Falls-none  Fractures-had a traumatic finger fracture this year  Supplements  Last vitamin D Lab Results  Component Value Date   VD25OH 27.48 (L)  05/26/2023   Thinks he takes 4000 international units daily    Exercise : Walking 1 hour 4-5 times per week   Struggling with left shoulder pain  Sees ortho  Had shots      Mood    03/07/2023   10:41 AM 11/19/2021    2:03 PM 05/28/2021    8:18 AM 11/11/2019    5:02 PM 07/13/2019   11:10 AM  Depression screen PHQ 2/9  Decreased Interest 2 0 0 0 0  Down, Depressed, Hopeless 3 0 0 0   PHQ - 2 Score 5 0 0 0 0  Altered sleeping 3  0  1  Tired, decreased energy 0  0  0  Change in appetite 0  0  0  Feeling bad or failure about yourself  3  0  1  Trouble concentrating 1  0  2  Moving slowly or fidgety/restless 0  0  0  Suicidal thoughts 1  0  0  PHQ-9 Score 13  0  4  Difficult doing work/chores Extremely dIfficult  Not difficult at all    History of dep with anx  Zoloft 50 mg daily  Is really helping    ADD Takes metadate ER 20 mg daily as needed  Very helpful    GERD Protonix 40 mg - cut to once daily instead of twice  Lab Results  Component Value Date   VITAMINB12 462 01/02/2023     Hyperlipidemia Lab Results  Component Value Date  CHOL 182 05/26/2023   CHOL 165 05/23/2022   CHOL 175 05/28/2021   Lab Results  Component Value Date   HDL 62.30 05/26/2023   HDL 54.90 05/23/2022   HDL 60.20 05/28/2021   Lab Results  Component Value Date   LDLCALC 103 (H) 05/26/2023   LDLCALC 91 05/23/2022   LDLCALC 102 (H) 05/28/2021   Lab Results  Component Value Date   TRIG 81.0 05/26/2023   TRIG 92.0 05/23/2022   TRIG 66.0 05/28/2021   Lab Results  Component Value Date   CHOLHDL 3 05/26/2023   CHOLHDL 3 05/23/2022   CHOLHDL 3 05/28/2021   Lab Results  Component Value Date   LDLDIRECT 130.9 10/25/2008   LDLDIRECT 141.4 03/16/2007   LDLDIRECT 139.6 09/18/2006   Crestor 40 mg daily  Is eating a lot better lately    Prediabetes Lab Results  Component Value Date   HGBA1C 6.1 05/26/2023   Stable  Eating better  Cut wine a lot     Patient  Active Problem List   Diagnosis Date Noted   Vitamin D deficiency 05/28/2021   Diverticulosis 05/19/2020   Prediabetes 05/22/2017   B12 deficiency 04/22/2017   Current use of proton pump inhibitor 04/15/2016   Anxiety 08/20/2013   Colon cancer screening 03/31/2012   Allergic drug reaction 01/29/2012   Routine general medical examination at a health care facility 11/08/2010   Prostate cancer screening 11/08/2010   GERD 09/20/2009   ANXIETY STATE NOS 01/08/2007   Attention deficit disorder 01/08/2007   Hyperlipidemia 01/07/2007   ERECTILE DYSFUNCTION 01/07/2007   Depression with anxiety 01/07/2007   Allergic rhinitis 01/07/2007   Past Medical History:  Diagnosis Date   ADD (attention deficit disorder)    Allergy    allergic rhinitis   Anxiety    Bursitis of hip, right    Depression    GERD (gastroesophageal reflux disease)    History of meniscal tear    left knee meniscal tear   Hyperlipidemia    Past Surgical History:  Procedure Laterality Date   COLONOSCOPY     Social History   Tobacco Use   Smoking status: Former    Current packs/day: 0.00    Types: Cigarettes    Quit date: 1978    Years since quitting: 46.8   Smokeless tobacco: Never   Tobacco comments:    Smoked for about one year 30 years ago  Vaping Use   Vaping status: Never Used  Substance Use Topics   Alcohol use: Yes    Alcohol/week: 0.0 standard drinks of alcohol    Comment: occ   Drug use: Never   Family History  Problem Relation Age of Onset   Heart disease Father        CAD   Hypertension Mother    Depression Mother    Heart disease Paternal Uncle        CAD   COPD Maternal Grandfather    Colon cancer Neg Hx    Colon polyps Neg Hx    Rectal cancer Neg Hx    Stomach cancer Neg Hx    Esophageal cancer Neg Hx    Allergies  Allergen Reactions   Aleve [Naproxen Sodium] Swelling    Face and lips swollen and hives   Current Outpatient Medications on File Prior to Visit  Medication Sig  Dispense Refill   Acetaminophen (TYLENOL PO) Take by mouth as needed.     benzonatate (TESSALON) 200 MG capsule Take 1 capsule (200 mg  total) by mouth 3 (three) times daily as needed for cough. Swallow whole 30 capsule 1   Cholecalciferol (VITAMIN D3) 50 MCG (2000 UT) capsule Take 4,000 Units by mouth daily.     cyanocobalamin (VITAMIN B12) 1000 MCG tablet Take 1,000 mcg by mouth daily.     methylphenidate (METADATE ER) 20 MG ER tablet Take 1 tablet (20 mg total) by mouth daily as needed. 30 tablet 0   sildenafil (REVATIO) 20 MG tablet TAKE 3 TABLETS BY MOUTH 30 MINUTES BEFORE SEXUAL ACTIVITY AS NEEDED 30 tablet 5   Thiamine HCl (VITAMIN B-1 PO) Take by mouth daily.     No current facility-administered medications on file prior to visit.    Review of Systems  Constitutional:  Negative for activity change, appetite change, fatigue, fever and unexpected weight change.  HENT:  Negative for congestion, rhinorrhea, sore throat and trouble swallowing.   Eyes:  Negative for pain, redness, itching and visual disturbance.  Respiratory:  Negative for cough, chest tightness, shortness of breath and wheezing.   Cardiovascular:  Negative for chest pain and palpitations.  Gastrointestinal:  Negative for abdominal pain, blood in stool, constipation, diarrhea and nausea.  Endocrine: Negative for cold intolerance, heat intolerance, polydipsia and polyuria.  Genitourinary:  Negative for difficulty urinating, dysuria, frequency and urgency.  Musculoskeletal:  Positive for arthralgias. Negative for joint swelling and myalgias.       Left shoulder pain  Skin:  Negative for pallor and rash.  Neurological:  Negative for dizziness, tremors, weakness, numbness and headaches.  Hematological:  Negative for adenopathy. Does not bruise/bleed easily.  Psychiatric/Behavioral:  Negative for decreased concentration and dysphoric mood. The patient is not nervous/anxious.        Objective:   Physical  Exam Constitutional:      General: He is not in acute distress.    Appearance: Normal appearance. He is well-developed and normal weight. He is not ill-appearing or diaphoretic.  HENT:     Head: Normocephalic and atraumatic.     Right Ear: Tympanic membrane, ear canal and external ear normal.     Left Ear: Tympanic membrane, ear canal and external ear normal.     Nose: Nose normal. No congestion.     Mouth/Throat:     Mouth: Mucous membranes are moist.     Pharynx: Oropharynx is clear. No posterior oropharyngeal erythema.  Eyes:     General: No scleral icterus.       Right eye: No discharge.        Left eye: No discharge.     Conjunctiva/sclera: Conjunctivae normal.     Pupils: Pupils are equal, round, and reactive to light.  Neck:     Thyroid: No thyromegaly.     Vascular: No carotid bruit or JVD.  Cardiovascular:     Rate and Rhythm: Normal rate and regular rhythm.     Pulses: Normal pulses.     Heart sounds: Normal heart sounds.     No gallop.  Pulmonary:     Effort: Pulmonary effort is normal. No respiratory distress.     Breath sounds: Normal breath sounds. No wheezing or rales.     Comments: Good air exch Chest:     Chest wall: No tenderness.  Abdominal:     General: Bowel sounds are normal. There is no distension or abdominal bruit.     Palpations: Abdomen is soft. There is no mass.     Tenderness: There is no abdominal tenderness.     Hernia:  No hernia is present.  Musculoskeletal:        General: No tenderness.     Cervical back: Normal range of motion and neck supple. No rigidity. No muscular tenderness.     Right lower leg: No edema.     Left lower leg: No edema.  Lymphadenopathy:     Cervical: No cervical adenopathy.  Skin:    General: Skin is warm and dry.     Coloration: Skin is not pale.     Findings: No erythema or rash.     Comments: Solar lentigines diffusely Some scattered sks   Neurological:     Mental Status: He is alert.     Cranial Nerves: No  cranial nerve deficit.     Motor: No abnormal muscle tone.     Coordination: Coordination normal.     Gait: Gait normal.     Deep Tendon Reflexes: Reflexes are normal and symmetric. Reflexes normal.  Psychiatric:        Mood and Affect: Mood normal.        Cognition and Memory: Cognition and memory normal.           Assessment & Plan:   Problem List Items Addressed This Visit       Digestive   GERD    Was able to cut protonix to every day from bid  Watching diet       Relevant Medications   pantoprazole (PROTONIX) 40 MG tablet     Other   Attention deficit disorder    Continues methylphenidate ER 20 mg on days he needs it       B12 deficiency    Lab Results  Component Value Date   VITAMINB12 462 01/02/2023   Doing well with oral suppl On ppi      Colon cancer screening    Colonoscopy 08/2022 with 3 y recall      Current use of proton pump inhibitor    Last vitamin D Lab Results  Component Value Date   VD25OH 27.48 (L) 05/26/2023   Encouraged pt to make sure he is taking this Lab Results  Component Value Date   VITAMINB12 462 01/02/2023         Depression with anxiety    Doing better overall with zoloft 50 mg daily  Unsure when he will retire Encouraged exercise and good self care       Relevant Medications   sertraline (ZOLOFT) 50 MG tablet   Hyperlipidemia    Disc goals for lipids and reasons to control them Rev last labs with pt Rev low sat fat diet in detail  LDL of 103 and HDL up into 60s Encouraged to continue crestor 40 mg daily      Relevant Medications   rosuvastatin (CRESTOR) 40 MG tablet   Prediabetes    Lab Results  Component Value Date   HGBA1C 6.1 05/26/2023   disc imp of low glycemic diet and wt loss to prevent DM2       Prostate cancer screening    Lab Results  Component Value Date   PSA 2.65 05/26/2023   PSA 1.25 05/23/2022   PSA 1.76 05/28/2021    Given increase of more than 1 pt will re check 6 months No  symptoms  No fam hx      Routine general medical examination at a health care facility - Primary    Reviewed health habits including diet and exercise and skin cancer prevention Reviewed appropriate screening tests  for age  Also reviewed health mt list, fam hx and immunization status , as well as social and family history   See HPI Labs reviewed and ordered Discussed shingrix vaccine  Psa noted  Colonoscopy 08/2022 with 3 y recall  Discussed fall prevention, supplements and exercise for bone density  Mood is much improved recently with zolofr and ADD med        Vitamin D deficiency    Last vitamin D Lab Results  Component Value Date   VD25OH 27.48 (L) 05/26/2023    Encouraged pt to be compliant with vit D3 4000 international units every day For bone and overall health

## 2023-06-02 NOTE — Assessment & Plan Note (Signed)
Lab Results  Component Value Date   HGBA1C 6.1 05/26/2023   disc imp of low glycemic diet and wt loss to prevent DM2

## 2023-06-02 NOTE — Assessment & Plan Note (Signed)
Last vitamin D Lab Results  Component Value Date   VD25OH 27.48 (L) 05/26/2023   Encouraged pt to make sure he is taking this Lab Results  Component Value Date   VITAMINB12 462 01/02/2023

## 2023-06-02 NOTE — Assessment & Plan Note (Signed)
Disc goals for lipids and reasons to control them Rev last labs with pt Rev low sat fat diet in detail  LDL of 103 and HDL up into 60s Encouraged to continue crestor 40 mg daily

## 2023-06-02 NOTE — Assessment & Plan Note (Signed)
Lab Results  Component Value Date   PSA 2.65 05/26/2023   PSA 1.25 05/23/2022   PSA 1.76 05/28/2021    Given increase of more than 1 pt will re check 6 months No symptoms  No fam hx

## 2023-06-02 NOTE — Assessment & Plan Note (Signed)
Doing better overall with zoloft 50 mg daily  Unsure when he will retire Encouraged exercise and good self care

## 2023-06-02 NOTE — Assessment & Plan Note (Signed)
Reviewed health habits including diet and exercise and skin cancer prevention Reviewed appropriate screening tests for age  Also reviewed health mt list, fam hx and immunization status , as well as social and family history   See HPI Labs reviewed and ordered Discussed shingrix vaccine  Psa noted  Colonoscopy 08/2022 with 3 y recall  Discussed fall prevention, supplements and exercise for bone density  Mood is much improved recently with zolofr and ADD med

## 2023-06-02 NOTE — Assessment & Plan Note (Signed)
Last vitamin D Lab Results  Component Value Date   VD25OH 27.48 (L) 05/26/2023    Encouraged pt to be compliant with vit D3 4000 international units every day For bone and overall health

## 2023-06-02 NOTE — Assessment & Plan Note (Signed)
Lab Results  Component Value Date   VITAMINB12 462 01/02/2023   Doing well with oral suppl On ppi

## 2023-08-08 ENCOUNTER — Other Ambulatory Visit: Payer: Self-pay | Admitting: Family Medicine

## 2023-08-08 MED ORDER — METHYLPHENIDATE HCL ER 20 MG PO TBCR: 20.0000 mg | EXTENDED_RELEASE_TABLET | Freq: Every day | ORAL | 0 refills | Status: DC | PRN

## 2023-08-08 NOTE — Telephone Encounter (Signed)
Copied from CRM [id number]. Topic: Clinical - Medication Refill >> Aug 08, 2023  3:01 PM Isabell A wrote: Most Recent Primary Care Visit:  Provider: Roxy Manns A  Department: [facility]  Visit Type: PHYSICAL  Date: 06/02/2023  Medication: ***  Has the patient contacted their pharmacy?  (Agent: If no, request that the patient contact the pharmacy for the refill. If patient does not wish to contact the pharmacy document the reason why and proceed with request.) (Agent: If yes, when and what did the pharmacy advise?)  Is this the correct pharmacy for this prescription?  If no, delete pharmacy and type the correct one.  This is the patient's preferred pharmacy:  [id number] - [place], [place] - [address] AT [address]NER OF [facility] [address] [place] [address] Phone: [phone] Fax: [phone]   Has the prescription been filled recently?   Is the patient out of the medication?   Has the patient been seen for an appointment in the last year OR does the patient have an upcoming appointment?   Can we respond through MyChart?   Agent: Please be advised that Rx refills may take up to 3 business days. We ask that you follow-up with your pharmacy.

## 2023-08-08 NOTE — Telephone Encounter (Signed)
Name of Medication: Metadate ER Name of Pharmacy: [facility] [address] Last Fill or Written Date and Quantity: 02/14/23 #30 tabs/ 0 refills  Last Office Visit and Type: 06/02/23 CPE Next Office Visit and Type: 12/02/23 f/u

## 2023-08-21 ENCOUNTER — Telehealth: Payer: Self-pay | Admitting: *Deleted

## 2023-08-21 NOTE — Telephone Encounter (Signed)
Copied from CRM [id number]. Topic: Clinical - Prescription Issue >> Aug 21, 2023 10:42 AM Fredrich Romans wrote: Reason for CRM: patient needs a PA for medication methylphenidate (METADATE ER) 20 MG ER tablet

## 2023-08-25 ENCOUNTER — Other Ambulatory Visit (HOSPITAL_COMMUNITY): Payer: Self-pay

## 2023-08-26 ENCOUNTER — Other Ambulatory Visit (HOSPITAL_COMMUNITY): Payer: Self-pay

## 2023-08-26 ENCOUNTER — Telehealth: Payer: Self-pay

## 2023-08-26 NOTE — Telephone Encounter (Signed)
 Pharmacy Patient Advocate Encounter   Received notification from Pt Calls Messages that prior authorization for Methylphenidate  HCl ER 20MG  er tablets is required/requested.   Insurance verification completed.   The patient is insured through [employer] .   Per test claim: PA required and submitted KEY/EOC/Request #: [id number] APPROVED from 07/27/23 to 08/25/24. Ran test claim, Copay is $12. This test claim was processed through [facility]- copay amounts may vary at other pharmacies due to pharmacy/plan contracts, or as the patient moves through the different stages of their insurance plan.   CaseId: [id number]

## 2023-08-26 NOTE — Telephone Encounter (Signed)
PA request has been Approved. New Encounter created for follow up. For additional info see Pharmacy Prior Auth telephone encounter from 08/26/23.

## 2023-08-26 NOTE — Telephone Encounter (Signed)
Called and no answer and no VM box set up

## 2023-08-29 ENCOUNTER — Other Ambulatory Visit: Payer: Self-pay | Admitting: Family Medicine

## 2023-08-29 NOTE — Telephone Encounter (Signed)
 This is a bit early-is he traveling? Needing early ?

## 2023-08-29 NOTE — Telephone Encounter (Signed)
 Name of Medication: Metadate  ER Name of Pharmacy: [facility] [address] Last Fill or Written Date and Quantity: 08/08/23 #30 tabs/ 0 refills  Last Office Visit and Type: 06/02/23 CPE Next Office Visit and Type: 12/02/23 f/u

## 2023-08-29 NOTE — Telephone Encounter (Signed)
 Copied from CRM [id number]. Topic: Clinical - Medication Refill >> Aug 29, 2023 10:16 AM Corin V wrote: Most Recent Primary Care Visit:  Provider: RANDEEN HARDY A  Department: [facility]  Visit Type: PHYSICAL  Date: 06/02/2023  Medication: methylphenidate  (METADATE  ER) 20 MG ER tablet- patient thought the request was started when he called in on 08/27/23  Has the patient contacted their pharmacy? Yes (Agent: If no, request that the patient contact the pharmacy for the refill. If patient does not wish to contact the pharmacy document the reason why and proceed with request.) (Agent: If yes, when and what did the pharmacy advise?)  Is this the correct pharmacy for this prescription? Yes If no, delete pharmacy and type the correct one.  This is the patient's preferred pharmacy:  [id number] - [place], [place] - [address] AT [address] OF [facility] [address] [place] [address] Phone: [phone] Fax: [phone]   Has the prescription been filled recently? No  Is the patient out of the medication? Yes  Has the patient been seen for an appointment in the last year OR does the patient have an upcoming appointment? Yes  Can we respond through MyChart? No  Agent: Please be advised that Rx refills may take up to 3 business days. We ask that you follow-up with your pharmacy.

## 2023-08-29 NOTE — Telephone Encounter (Signed)
 Pharmacy advised

## 2023-08-29 NOTE — Telephone Encounter (Signed)
 Left VM requesting pt to call the office back

## 2023-09-04 ENCOUNTER — Other Ambulatory Visit (HOSPITAL_COMMUNITY): Payer: Self-pay

## 2023-09-05 NOTE — Telephone Encounter (Signed)
Left VM requesting pt to call the office back

## 2023-09-09 MED ORDER — METHYLPHENIDATE HCL ER 20 MG PO TBCR: 20.0000 mg | EXTENDED_RELEASE_TABLET | Freq: Every day | ORAL | 0 refills | Status: DC | PRN

## 2023-09-09 NOTE — Telephone Encounter (Signed)
 Refill is due now will route back to PCP

## 2023-11-28 ENCOUNTER — Telehealth: Payer: Self-pay

## 2023-11-28 ENCOUNTER — Other Ambulatory Visit: Payer: Self-pay | Admitting: Family Medicine

## 2023-11-28 NOTE — Telephone Encounter (Signed)
 Pharmacy Patient Advocate Encounter   Received notification from CoverMyMeds that prior authorization for Sildenafil  Citrate (PAH) 20MG  tablets is required/requested.   Insurance verification completed.   The patient is insured through [employer] .   Per test claim: PA required; PA submitted to above mentioned insurance via CoverMyMeds Key/confirmation #/EOC [id number] Status is pending

## 2023-12-02 ENCOUNTER — Ambulatory Visit: Payer: Self-pay | Admitting: Family Medicine

## 2023-12-02 ENCOUNTER — Ambulatory Visit: Payer: 59 | Admitting: Family Medicine

## 2023-12-02 ENCOUNTER — Other Ambulatory Visit

## 2023-12-02 ENCOUNTER — Telehealth: Payer: Self-pay | Admitting: Family Medicine

## 2023-12-02 DIAGNOSIS — Z125 Encounter for screening for malignant neoplasm of prostate: Secondary | ICD-10-CM

## 2023-12-02 LAB — PSA: PSA: 2.32 ng/mL (ref 0.10–4.00)

## 2023-12-02 NOTE — Addendum Note (Signed)
 Addended by: Darcella Earnest on: 12/02/2023 08:37 AM   Modules accepted: Orders

## 2023-12-02 NOTE — Telephone Encounter (Signed)
 Psa re check

## 2023-12-05 ENCOUNTER — Other Ambulatory Visit (HOSPITAL_COMMUNITY): Payer: Self-pay

## 2024-01-22 ENCOUNTER — Other Ambulatory Visit: Payer: Self-pay | Admitting: Family Medicine

## 2024-02-26 NOTE — Telephone Encounter (Signed)
 Pharmacy Patient Advocate Encounter  Received notification from [employer] that Prior Authorization for Sildenafil  Citrate (PAH) 20MG  tablets has been DENIED.  Full denial letter will be uploaded to the media tab. See denial reason below.  We are unable to approve this request for the following reason(s): Coverage is provided in situations where the requested medication is being used for the treatment of pulmonary hypertension (WHO Group 1 PAH) or Raynaud's phenomenon. Other coverage conditions apply Coverage cannot be authorized at this time.  PA #/Case ID/Reference #: [id number]

## 2024-03-08 ENCOUNTER — Encounter: Payer: Self-pay | Admitting: Family Medicine

## 2024-03-08 ENCOUNTER — Ambulatory Visit (INDEPENDENT_AMBULATORY_CARE_PROVIDER_SITE_OTHER): Admitting: Family Medicine

## 2024-03-08 VITALS — BP 130/80 | HR 66 | Temp 98.3°F | Ht 66.0 in | Wt 165.2 lb

## 2024-03-08 DIAGNOSIS — F418 Other specified anxiety disorders: Secondary | ICD-10-CM | POA: Diagnosis not present

## 2024-03-08 MED ORDER — SERTRALINE HCL 50 MG PO TABS: 75.0000 mg | ORAL_TABLET | Freq: Every day | ORAL | 0 refills | Status: DC

## 2024-03-08 NOTE — Patient Instructions (Addendum)
 Go up on sertraline  to 1 1/2 pills once daily  Let us  know in 2-4 weeks how you are doing   If side effects or if you feel worse then please let us  know asap (call)   Take care of yourself the best you can Physical activity/exercise is very important    I put the referral in for mental health counseling  Please let us  know if you don't hear in 1-2 weeks to set that up (mychart message or call or letter)   On the way out- please ask to de activate your mychart account since you are not using it

## 2024-03-08 NOTE — Progress Notes (Signed)
 Subjective:    Patient ID: Benjamin Bullock, male    DOB: [birth year], 73 y.o.   MRN: [id number]  HPI  Wt Readings from Last 3 Encounters:  03/08/24 165 lb 4 oz (75 kg)  06/02/23 168 lb (76.2 kg)  05/21/23 170 lb (77.1 kg)   26.67 kg/m  Vitals:   03/08/24 1022  BP: 130/80  Pulse: 66  Temp: 98.3 F (36.8 C)  SpO2: 95%   Pt presents with c/o  Anxious mood from stress    Taking sertraline  50 mg daily for anx and depression   Gets overly anxious about things when peers do not  Has to produce commissions on a daily basis   It makes him feel disappointed in himself  Giving him some obsessive compulsive behavior  Compulsive thoughts at time   Causes panicky on the inside Does like his job and his clients    Stressors  Wife has breast cancer  Son is having some mental health troubles  Checking account was compromised  Helping his son financially  Not in a position to retire - and works very long hours --- no time for self care   Work is trying to accommodate him and he feels valued     Also has adhd = takes methylphenidate       03/08/2024   11:32 AM 03/07/2023   10:41 AM 11/19/2021    2:03 PM 05/28/2021    8:18 AM 11/11/2019    5:02 PM  Depression screen PHQ 2/9  Decreased Interest 0 2 0 0 0  Down, Depressed, Hopeless 1 3 0 0 0  PHQ - 2 Score 1 5 0 0 0  Altered sleeping 0 3  0   Tired, decreased energy 0 0  0   Change in appetite 0 0  0   Feeling bad or failure about yourself  1 3  0   Trouble concentrating 0 1  0   Moving slowly or fidgety/restless 0 0  0   Suicidal thoughts 0 1  0   PHQ-9 Score 2 13  0   Difficult doing work/chores Somewhat difficult Extremely dIfficult  Not difficult at all       03/08/2024   11:33 AM 03/07/2023   10:41 AM  GAD 7 : Generalized Anxiety Score  Nervous, Anxious, on Edge 1 3  Control/stop worrying 0 3  Worry too much - different things 0 3  Trouble relaxing 0 3  Restless 0 2  Easily annoyed or irritable 0 2  Afraid -  awful might happen 1 3  Total GAD 7 Score 2 19  Anxiety Difficulty Somewhat difficult Extremely difficult    Had a few counseling sessions virtually last year  With his firm       Patient Active Problem List   Diagnosis Date Noted   Vitamin D  deficiency 05/28/2021   Diverticulosis 05/19/2020   Prediabetes 05/22/2017   B12 deficiency 04/22/2017   Current use of proton pump inhibitor 04/15/2016   Anxiety 08/20/2013   Colon cancer screening 03/31/2012   Allergic drug reaction 01/29/2012   Routine general medical examination at a health care facility 11/08/2010   Prostate cancer screening 11/08/2010   GERD 09/20/2009   Attention deficit disorder 01/08/2007   Hyperlipidemia 01/07/2007   ERECTILE DYSFUNCTION 01/07/2007   Depression with anxiety 01/07/2007   Allergic rhinitis 01/07/2007   Past Medical History:  Diagnosis Date   ADD (attention deficit disorder)    Allergy    allergic  rhinitis   Anxiety    Bursitis of hip, right    Depression    GERD (gastroesophageal reflux disease)    History of meniscal tear    left knee meniscal tear   Hyperlipidemia    Past Surgical History:  Procedure Laterality Date   COLONOSCOPY     Social History   Tobacco Use   Smoking status: Former    Current packs/day: 0.00    Types: Cigarettes    Quit date: 1978    Years since quitting: 47.6   Smokeless tobacco: Never   Tobacco comments:    Smoked for about one year 30 years ago  Vaping Use   Vaping status: Never Used  Substance Use Topics   Alcohol use: Yes    Alcohol/week: 0.0 standard drinks of alcohol    Comment: occ   Drug use: Never   Family History  Problem Relation Age of Onset   Heart disease Father        CAD   Hypertension Mother    Depression Mother    Heart disease Paternal Uncle        CAD   COPD Maternal Grandfather    Colon cancer Neg Hx    Colon polyps Neg Hx    Rectal cancer Neg Hx    Stomach cancer Neg Hx    Esophageal cancer Neg Hx    Allergies   Allergen Reactions   Aleve [Naproxen Sodium] Swelling    Face and lips swollen and hives   Current Outpatient Medications on File Prior to Visit  Medication Sig Dispense Refill   Acetaminophen  (TYLENOL  PO) Take by mouth as needed.     Cholecalciferol (VITAMIN D3) 50 MCG (2000 UT) capsule Take 4,000 Units by mouth daily.     cyanocobalamin  (VITAMIN B12) 1000 MCG tablet Take 1,000 mcg by mouth daily.     methylphenidate  (METADATE  ER) 20 MG ER tablet Take 1 tablet (20 mg total) by mouth daily as needed. 30 tablet 0   pantoprazole  (PROTONIX ) 40 MG tablet Take 1 tablet (40 mg total) by mouth daily. 90 tablet 3   rosuvastatin  (CRESTOR ) 40 MG tablet Take 1 tablet (40 mg total) by mouth daily. 90 tablet 3   sildenafil  (REVATIO ) 20 MG tablet TAKE 3 TABLETS BY MOUTH 30 MINUTES BEFORE SEXUAL ACTIVITY AS NEEDED 30 tablet 5   Thiamine HCl (VITAMIN B-1 PO) Take by mouth daily.     No current facility-administered medications on file prior to visit.    Review of Systems  Constitutional:  Positive for fatigue.  Psychiatric/Behavioral:  Positive for decreased concentration, dysphoric mood and sleep disturbance. Negative for suicidal ideas. The patient is nervous/anxious.        Objective:   Physical Exam Constitutional:      General: He is not in acute distress.    Appearance: Normal appearance. He is normal weight. He is not ill-appearing or diaphoretic.  Cardiovascular:     Rate and Rhythm: Normal rate.  Neurological:     Mental Status: He is alert.     Gait: Gait normal.  Psychiatric:        Attention and Perception: Attention normal.        Mood and Affect: Mood is anxious. Mood is not depressed. Affect is not flat or tearful.        Speech: Speech normal.        Behavior: Behavior normal. Behavior is not agitated or hyperactive.        Thought Content:  Thought content is not paranoid or delusional. Thought content does not include suicidal ideation.        Cognition and Memory:  Cognition and memory normal.     Comments: Candidly discusses symptoms and stressors    Changes subject frequently but is fairly attentive            Assessment & Plan:   Problem List Items Addressed This Visit       Other   Depression with anxiety - Primary   Worsened lately with recent increase in stress Also has to continue full time work/ getting harder on him  Not enough time for self care  Reviewed stressors/ coping techniques/symptoms/ support sources/ tx options and side effects in detail today  Discussed counseling- referral done  Discussed medication- will go up on sertraline  to 75 mg daily - instructed to call if not tolerated or if feeling worse   I personally spent a total of 32 minutes in the care of the patient today including preparing to see the patient, getting/reviewing separately obtained history, performing a medically appropriate exam/evaluation, counseling and educating, placing orders, referring and communicating with other health care professionals, and documenting clinical information in the EHR.            Relevant Medications   sertraline  (ZOLOFT ) 50 MG tablet   Other Relevant Orders   Ambulatory referral to Psychology

## 2024-03-08 NOTE — Assessment & Plan Note (Addendum)
 Worsened lately with recent increase in stress Also has to continue full time work/ getting harder on him  Not enough time for self care  Reviewed stressors/ coping techniques/symptoms/ support sources/ tx options and side effects in detail today  Discussed counseling- referral done  Discussed medication- will go up on sertraline  to 75 mg daily - instructed to call if not tolerated or if feeling worse   I personally spent a total of 32 minutes in the care of the patient today including preparing to see the patient, getting/reviewing separately obtained history, performing a medically appropriate exam/evaluation, counseling and educating, placing orders, referring and communicating with other health care professionals, and documenting clinical information in the EHR.

## 2024-03-16 ENCOUNTER — Ambulatory Visit (INDEPENDENT_AMBULATORY_CARE_PROVIDER_SITE_OTHER): Admitting: Podiatry

## 2024-03-16 ENCOUNTER — Ambulatory Visit (INDEPENDENT_AMBULATORY_CARE_PROVIDER_SITE_OTHER)

## 2024-03-16 DIAGNOSIS — M21612 Bunion of left foot: Secondary | ICD-10-CM

## 2024-03-16 DIAGNOSIS — M21619 Bunion of unspecified foot: Secondary | ICD-10-CM

## 2024-03-16 DIAGNOSIS — M21622 Bunionette of left foot: Secondary | ICD-10-CM | POA: Diagnosis not present

## 2024-03-16 DIAGNOSIS — L84 Corns and callosities: Secondary | ICD-10-CM

## 2024-03-16 NOTE — Patient Instructions (Signed)
 The medication used to treat the skin is called salicylic acid.  Regular strength salicylic acid pads are 17 to 20%, maximum strength is 40%.  Use these every night until lesion goes away if it does not improve after this.

## 2024-03-16 NOTE — Progress Notes (Signed)
  Subjective:  Patient ID: Benjamin Bullock, male    DOB: 1950/11/10,  MRN: [id number]  Chief Complaint  Patient presents with   Bunions    Rm 2 Patient is here for left foot bunion on lateral side. Area is red and tender to the touch.    Discussed the use of AI scribe software for clinical note transcription with the patient, who gave verbal consent to proceed.  History of Present Illness Benjamin Bullock is a 73 year old male who presents with a bunion on his left foot.  He has a bunion on the lateral aspect of his left foot, previously confirmed with x-rays.  He has been using a pumice stone to manage the callus, which has provided some relief, but the issue persists. The pain has decreased in severity since initial treatment, but the bunion remains bothersome.  He has a history of being physically active, engaging in walking and running throughout his life. He is uncertain about the cause of the bunion, attributing it to aging. He has not experienced significant pain recently, but the bunion continues to be a source of discomfort.      Objective:    Physical Exam VASCULAR: DP and PT pulse palpable. Foot is warm and well-perfused. Capillary fill time is brisk. DERMATOLOGIC: Normal skin turgor, texture, and temperature. No open lesions, rashes, or ulcerations. NEUROLOGIC: Normal sensation to light touch and pressure. No paresthesias. ORTHOPEDIC: Smooth pain-free range of motion of all examined joints. No ecchymosis or bruising. No gross deformity. No pain to palpation. Tailor's bunion deformity on left foot with tender plantar and lateral hyperkeratosis.   No images are attached to the encounter.    Results RADIOLOGY Foot X-ray: Tailor's bunion with bone protrusion   Assessment:   1. Tailor's bunion of left foot   2. Callus of foot      Plan:  Patient was evaluated and treated and all questions answered.  Assessment and Plan Assessment & Plan Left foot tailor's  bunion with associated plantar and lateral hyperkeratosis Chronic tailor's bunion on the left foot with associated plantar and lateral hyperkeratosis. The bunion causes pressure and callus formation, leading to discomfort. The condition is less painful than before but remains persistent. - Shave down the thickened skin on the left foot. - Apply salicylic acid to the affected area to help peel off the hyperkeratosis.  May continue OTC at home - Discuss orthotics or insoles to relieve pressure on the bunion.  Dancers pad applied to shoe today to offload - Consider surgery for osteotomy if conservative measures are unsuccessful and symptoms persist.      Return if symptoms worsen or fail to improve.

## 2024-05-06 ENCOUNTER — Ambulatory Visit: Admitting: Clinical

## 2024-05-06 DIAGNOSIS — F4322 Adjustment disorder with anxiety: Secondary | ICD-10-CM

## 2024-05-06 NOTE — Progress Notes (Signed)
 [facility] Counselor Initial Adult In-Person - Comprehensive Clinical Assessment  Name: Benjamin Bullock Date: 05/06/2024 MRN: [id number] DOB: 1950-10-19 PCP: Randeen Laine LABOR, MD  Session Time start: 10:33am End time: 1133 Total time: 60  Types of Service: Comprehensive Clinical Assessment (CCA)  Guardian/Payee:  Self    Paperwork requested: No   Benjamin Bullock participated from office with therapist and consented to treatment. We reviewed the limits of confidentiality prior to the start of the evaluation. Benjamin Bullock expressed understanding and agreed to proceed.   Reason for referral in patient/family's own words:  Increased stressors in the past year that he's learning to manage.  Client likes to be called [name].  Primary language at home is [place].  SUBJECTIVE:  Risk Assessment: Danger to Self:  No Self-injurious Behavior: No Danger to Others: No Duty to Warn:no Physical Aggression / Violence:No  Access to Firearms a concern: Did not ask  Client and/or legal guardian was educated about steps to take if suicide or homicide risk level increases between visits: n/a While future psychiatric events cannot be accurately predicted, the patient does not currently require acute inpatient psychiatric care and does not currently meet [place]  involuntary commitment criteria.  Current Stressors:  Family illness and Finances  Wife's illness, breast cancer Client's on lost  his job for a period of time - provided Presenter, broadcasting strain  Surveyor, mining Strengths/Protective Factors: Supportive Relationships and Able to Communicate Effectively Good personality - heart is in the right place. Analytical (strength & weakness)  Left leg hurt - 1-2 weeks  Current Health Habits: Sleep:   Overallgood sleep 9:30pm-6:30am/7am  Physical Activities: Ran for 10 years Was Walking 3-4 times a week, hip & left leg has been bothering him so  he has not been able to walk lately  Eating/Appetite: Eat 3 meals a day usually, weekends 2 meals a day  Current Medications and therapies:  Psychotropic medications: Current medications: Sertraline  50 mg Client taking medications as prescribed:  Yes Side effects reported: No  Therapies:  None  Psychiatric Review of systems: Insomnia: No Changes in appetite: No Decreased need for sleep: No Hallucinations: No   Paranoia: No    Psychiatric History: Past psychiatry diagnosis:  - Never diagnosed with ADHD, PCP thought it would help him focus more - Anxiety Patient currently being seen by therapist/psychiatrist:  No Prior Suicide Attempts: No Past psychiatry Hospitalization(s): No Past history of violence: No  Social History:  Living situation: Living with wife Relationship status: Married 46 years Number of Children if applicable:  2 (son & daughter) 2 grandsons  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 3 cups of decaf a day Have you recently consumed alcohol? yes, 1 glass of wine at night  Have you recently used any drugs, eg marijuana, other substances or prescriptions drugs not prescribed to them?  no  Have you recently consumed any tobacco or nicotine? no Does client seem concerned about dependence or abuse of any substance? no  Traumatic Experiences/Abuse history: Have you ever been exposed, witnessed or experienced any form of abuse, what  type?  None reported  Have you ever been exposed, witnessed or experienced something traumatic, describe?   Wife's illness - salmonella poisoning and was in the hospital for 5 days, initially they didn't know what it. Son had heart attack about 2 years ago  Family of Origin (Childhood History) Father was in the [employer], died at 75 yo due to heart attack Mother & 2 older sisters   Family History:  Family History  Problem Relation Age of Onset   Heart disease Father        CAD   Hypertension Mother    Depression Mother    Heart disease Paternal Uncle        CAD   COPD Maternal Grandfather    Colon cancer Neg Hx    Colon polyps Neg Hx    Rectal cancer Neg Hx    Stomach cancer Neg Hx    Esophageal cancer Neg Hx      Client Medical history  Medical History/Surgical History: reviewed' Past Medical History:  Diagnosis Date   ADD (attention deficit disorder)    Allergy    allergic rhinitis   Anxiety    Bursitis of hip, right    Depression    GERD (gastroesophageal reflux disease)    History of meniscal tear    left knee meniscal tear   Hyperlipidemia     Past Surgical History:  Procedure Laterality Date   COLONOSCOPY      Medications: Current Outpatient Medications  Medication Sig Dispense Refill   Acetaminophen  (TYLENOL  PO) Take by mouth as needed.     Cholecalciferol (VITAMIN D3) 50 MCG (2000 UT) capsule Take 4,000 Units by mouth daily.     cyanocobalamin  (VITAMIN B12) 1000 MCG tablet Take 1,000 mcg by mouth daily.     methylphenidate  (METADATE  ER) 20 MG ER tablet Take 1 tablet (20 mg total) by mouth daily as needed. 30 tablet 0   pantoprazole  (PROTONIX ) 40 MG tablet Take 1 tablet (40 mg total) by mouth daily. 90 tablet 3   rosuvastatin  (CRESTOR ) 40 MG tablet Take 1 tablet (40 mg total) by mouth daily. 90 tablet 3   sertraline  (ZOLOFT ) 50 MG tablet Take 1.5 tablets (75 mg total) by mouth daily. 135 tablet 0   sildenafil  (REVATIO ) 20 MG tablet TAKE 3 TABLETS BY  MOUTH 30 MINUTES BEFORE SEXUAL ACTIVITY AS NEEDED 30 tablet 5   Thiamine HCl (VITAMIN B-1 PO) Take by mouth daily.     No current facility-administered medications for this visit.    Allergies  Allergen Reactions   Aleve [Naproxen Sodium] Swelling    Face and lips swollen and hives     Interventions: Interventions utilized:  This Clinician reviewed information on new patient paperwork, explained services, identified presenting concerns, explored goals and built rapport. Obtained additional information for comprehensive assessment and developed general plan of care.    Client Family Response:  Mr. Fidalgo reported that it's been adjustment for him due to the situation with his wife and son.  He reported that those situations have improved so he feels better at this time.  He was still open to following up to learn other ways to manage stressors in his life.  Mental status exam:   General Appearance Siegfried:  Neat Eye Contact:  Fair Motor Behavior:  Normal Speech:  Normal Level of Consciousness:  Alert Mood:  Anxious Affect:  Appropriate Anxiety Level:  Minimal Thought Process:  Coherent and Tangential Thought Content:  WNL Perception:  Normal Judgment:  Good Insight:  Present   Clinical Assessment: Mr. Mckinnon is a 72 yo male who experienced multiple stressors in the past year that increased his anxiety level.  He reported that the specific situations with his wife and son have improved. Therefore his anxiety level has decreased.  He does continue to be anxious about finances and being able to support his family.   Diagnosis: Adjustment disorder with anxiety  Coordination of Care: Written progress or summary reports on medical chart  Recommendations for Services/Supports/Treatments: Individual psycho therapy to learn additional strategies to manage stress.  Mr. Manville asked to have information sent to the following email address: [email or web address] - send info about  strategies to manage stressors.   Follow up Plan: A follow-up was scheduled to create a more specific treatment plan and begin treatment. Therapist answered all questions during the evaluation and contact information was provided.   Calypso Hagarty P. Trudy, MSW, LCSW [employer] Therapist Main Office: [phone]

## 2024-05-23 ENCOUNTER — Telehealth: Payer: Self-pay | Admitting: Family Medicine

## 2024-05-23 DIAGNOSIS — Z125 Encounter for screening for malignant neoplasm of prostate: Secondary | ICD-10-CM

## 2024-05-23 DIAGNOSIS — R7303 Prediabetes: Secondary | ICD-10-CM

## 2024-05-23 DIAGNOSIS — E538 Deficiency of other specified B group vitamins: Secondary | ICD-10-CM

## 2024-05-23 DIAGNOSIS — E7800 Pure hypercholesterolemia, unspecified: Secondary | ICD-10-CM

## 2024-05-23 DIAGNOSIS — E559 Vitamin D deficiency, unspecified: Secondary | ICD-10-CM

## 2024-05-23 DIAGNOSIS — Z79899 Other long term (current) drug therapy: Secondary | ICD-10-CM

## 2024-05-23 NOTE — Telephone Encounter (Signed)
-----   Message from Veva JINNY Ferrari sent at 05/17/2024  3:47 PM EDT ----- Regarding: Lab orders for Wed, 11.5.25 Patient is scheduled for CPX labs, please order future labs, Thanks , Veva

## 2024-05-26 ENCOUNTER — Other Ambulatory Visit (INDEPENDENT_AMBULATORY_CARE_PROVIDER_SITE_OTHER): Payer: 59

## 2024-05-26 DIAGNOSIS — E538 Deficiency of other specified B group vitamins: Secondary | ICD-10-CM

## 2024-05-26 DIAGNOSIS — Z79899 Other long term (current) drug therapy: Secondary | ICD-10-CM

## 2024-05-26 DIAGNOSIS — Z125 Encounter for screening for malignant neoplasm of prostate: Secondary | ICD-10-CM | POA: Diagnosis not present

## 2024-05-26 DIAGNOSIS — E7800 Pure hypercholesterolemia, unspecified: Secondary | ICD-10-CM | POA: Diagnosis not present

## 2024-05-26 DIAGNOSIS — E559 Vitamin D deficiency, unspecified: Secondary | ICD-10-CM | POA: Diagnosis not present

## 2024-05-26 DIAGNOSIS — R7303 Prediabetes: Secondary | ICD-10-CM | POA: Diagnosis not present

## 2024-05-26 LAB — COMPREHENSIVE METABOLIC PANEL WITH GFR
ALT: 26 U/L (ref 0–53)
AST: 18 U/L (ref 0–37)
Albumin: 4.2 g/dL (ref 3.5–5.2)
Alkaline Phosphatase: 43 U/L (ref 39–117)
BUN: 16 mg/dL (ref 6–23)
CO2: 31 meq/L (ref 19–32)
Calcium: 8.9 mg/dL (ref 8.4–10.5)
Chloride: 102 meq/L (ref 96–112)
Creatinine, Ser: 0.74 mg/dL (ref 0.40–1.50)
GFR: 88.46 mL/min (ref >60.00)
Glucose, Bld: 96 mg/dL (ref 70–99)
Potassium: 4 meq/L (ref 3.5–5.1)
Sodium: 140 meq/L (ref 135–145)
Total Bilirubin: 0.8 mg/dL (ref 0.2–1.2)
Total Protein: 6.3 g/dL (ref 6.0–8.3)

## 2024-05-26 LAB — CBC WITH DIFFERENTIAL/PLATELET
Basophils Absolute: 0 K/uL (ref 0.0–0.1)
Basophils Relative: 1 % (ref 0.0–3.0)
Eosinophils Absolute: 0.1 K/uL (ref 0.0–0.7)
Eosinophils Relative: 1.7 % (ref 0.0–5.0)
HCT: 40.7 % (ref 39.0–52.0)
Hemoglobin: 13.7 g/dL (ref 13.0–17.0)
Lymphocytes Relative: 23.8 % (ref 12.0–46.0)
Lymphs Abs: 1.1 K/uL (ref 0.7–4.0)
MCHC: 33.8 g/dL (ref 30.0–36.0)
MCV: 92.1 fl (ref 78.0–100.0)
Monocytes Absolute: 0.4 K/uL (ref 0.1–1.0)
Monocytes Relative: 8.2 % (ref 3.0–12.0)
Neutro Abs: 3 K/uL (ref 1.4–7.7)
Neutrophils Relative %: 65.3 % (ref 43.0–77.0)
Platelets: 169 K/uL (ref 150.0–400.0)
RBC: 4.42 Mil/uL (ref 4.22–5.81)
RDW: 14.1 % (ref 11.5–15.5)
WBC: 4.5 K/uL (ref 4.0–10.5)

## 2024-05-26 LAB — LIPID PANEL
Cholesterol: 189 mg/dL (ref 0–200)
HDL: 62.6 mg/dL (ref >39.00)
LDL Cholesterol: 110 mg/dL — ABNORMAL HIGH (ref 0–99)
NonHDL: 126.47
Total CHOL/HDL Ratio: 3
Triglycerides: 80 mg/dL (ref 0.0–149.0)
VLDL: 16 mg/dL (ref 0.0–40.0)

## 2024-05-26 LAB — HEMOGLOBIN A1C: Hgb A1c MFr Bld: 6.4 % (ref 4.6–6.5)

## 2024-05-27 ENCOUNTER — Ambulatory Visit: Payer: Self-pay | Admitting: Family Medicine

## 2024-05-27 LAB — VITAMIN D 25 HYDROXY (VIT D DEFICIENCY, FRACTURES): VITD: 30.1 ng/mL (ref 30.00–100.00)

## 2024-05-27 LAB — PSA, MEDICARE: PSA: 1.6 ng/mL (ref 0.10–4.00)

## 2024-05-27 LAB — VITAMIN B12: Vitamin B-12: 358 pg/mL (ref 211–911)

## 2024-05-27 LAB — TSH: TSH: 2.12 u[IU]/mL (ref 0.35–5.50)

## 2024-06-02 ENCOUNTER — Encounter: Payer: Self-pay | Admitting: Family Medicine

## 2024-06-02 ENCOUNTER — Ambulatory Visit (INDEPENDENT_AMBULATORY_CARE_PROVIDER_SITE_OTHER): Payer: 59 | Admitting: Family Medicine

## 2024-06-02 VITALS — BP 136/86 | HR 62 | Temp 98.5°F | Ht 66.0 in | Wt 159.5 lb

## 2024-06-02 DIAGNOSIS — E7800 Pure hypercholesterolemia, unspecified: Secondary | ICD-10-CM

## 2024-06-02 DIAGNOSIS — E538 Deficiency of other specified B group vitamins: Secondary | ICD-10-CM

## 2024-06-02 DIAGNOSIS — K219 Gastro-esophageal reflux disease without esophagitis: Secondary | ICD-10-CM

## 2024-06-02 DIAGNOSIS — R7303 Prediabetes: Secondary | ICD-10-CM | POA: Diagnosis not present

## 2024-06-02 DIAGNOSIS — Z79899 Other long term (current) drug therapy: Secondary | ICD-10-CM

## 2024-06-02 DIAGNOSIS — Z1211 Encounter for screening for malignant neoplasm of colon: Secondary | ICD-10-CM

## 2024-06-02 DIAGNOSIS — Z0000 Encounter for general adult medical examination without abnormal findings: Secondary | ICD-10-CM | POA: Diagnosis not present

## 2024-06-02 DIAGNOSIS — R41840 Attention and concentration deficit: Secondary | ICD-10-CM

## 2024-06-02 DIAGNOSIS — Z125 Encounter for screening for malignant neoplasm of prostate: Secondary | ICD-10-CM

## 2024-06-02 DIAGNOSIS — E559 Vitamin D deficiency, unspecified: Secondary | ICD-10-CM

## 2024-06-02 DIAGNOSIS — F418 Other specified anxiety disorders: Secondary | ICD-10-CM

## 2024-06-02 MED ORDER — PANTOPRAZOLE SODIUM 40 MG PO TBEC: 40.0000 mg | DELAYED_RELEASE_TABLET | Freq: Every day | ORAL | 3 refills | Status: AC

## 2024-06-02 MED ORDER — SERTRALINE HCL 50 MG PO TABS: 75.0000 mg | ORAL_TABLET | Freq: Every day | ORAL | 3 refills | Status: DC

## 2024-06-02 MED ORDER — ROSUVASTATIN CALCIUM 40 MG PO TABS: 40.0000 mg | ORAL_TABLET | Freq: Every day | ORAL | 3 refills | Status: AC

## 2024-06-02 NOTE — Assessment & Plan Note (Signed)
 Continues sertraline  75 mg daily  Has helped  Continues talk therapy as well  Encouraged good self care including exercise

## 2024-06-02 NOTE — Progress Notes (Signed)
 Subjective:    Patient ID: Benjamin Bullock, male    DOB: 06-Nov-1950, 73 y.o.   MRN: [id number]  HPI  Here for health maintenance exam and to review chronic medical problems   Wt Readings from Last 3 Encounters:  06/02/24 159 lb 8 oz (72.3 kg)  03/08/24 165 lb 4 oz (75 kg)  06/02/23 168 lb (76.2 kg)   25.74 kg/m  Vitals:   06/02/24 1102  BP: 136/86  Pulse: 62  Temp: 98.5 F (36.9 C)  SpO2: 95%    Immunization History  Administered Date(s) Administered   Fluad Quad(high Dose 65+) 05/04/2019, 05/04/2021, 05/25/2022   Fluad Trivalent(High Dose 65+) 05/21/2023   INFLUENZA, HIGH DOSE SEASONAL PF 04/21/2020, 04/23/2024   Influenza Split 03/31/2012   Influenza Whole 06/24/2006, 05/29/2007, 04/19/2008   Influenza,inj,Quad PF,6+ Mos 05/12/2013, 04/04/2014, 04/10/2015, 04/15/2016, 04/22/2017, 05/01/2018   Moderna SARS-COV2 Booster Vaccination 04/23/2024   PFIZER Comirnaty(Gray Top)Covid-19 Tri-Sucrose Vaccine 10/21/2020   PFIZER(Purple Top)SARS-COV-2 Vaccination 08/08/2019, 08/28/2019, 04/21/2020   Pfizer Covid-19 Vaccine Bivalent Booster 50yrs & up 06/02/2021   Pfizer(Comirnaty)Fall Seasonal Vaccine 12 years and older 05/25/2022   Pneumococcal Conjugate-13 04/10/2015   Pneumococcal Polysaccharide-23 04/04/2014   Td 10/05/2004, 05/30/2022   Tdap 04/17/2011   Zoster, Live 06/16/2013    Health Maintenance Due  Topic Date Due   Zoster Vaccines- Shingrix (1 of 2) 08/10/1967    Has kept busy  Grandkids /sports   Got the covid and flu shots   Shingrix -plans to get it    Prostate health Lab Results  Component Value Date   PSA 1.60 05/26/2024   PSA 2.32 12/02/2023   PSA 2.65 05/26/2023   No family history  No change in voiding    Colon cancer screening  Colonoscopy 08/2022 with 3 y recall   Bone health   Falls-none  Fractures-none  Supplements -taking the D3 4000 international units daily Last vitamin D  Lab Results  Component Value Date   VD25OH 30.10  05/26/2024    Exercise  Walking (shorter distances)  Working around hip pain/bursitis  Also had a shot   Has access to stationary bike     Mood    06/02/2024   11:06 AM 03/08/2024   11:32 AM 03/07/2023   10:41 AM 11/19/2021    2:03 PM 05/28/2021    8:18 AM  Depression screen PHQ 2/9  Decreased Interest 0 0 2 0 0  Down, Depressed, Hopeless 0 1 3 0 0  PHQ - 2 Score 0 1 5 0 0  Altered sleeping 0 0 3  0  Tired, decreased energy 0 0 0  0  Change in appetite 0 0 0  0  Feeling bad or failure about yourself  0 1 3  0  Trouble concentrating 0 0 1  0  Moving slowly or fidgety/restless 0 0 0  0  Suicidal thoughts 0 0 1  0  PHQ-9 Score 0 2  13   0   Difficult doing work/chores Not difficult at all Somewhat difficult Extremely dIfficult  Not difficult at all     Data saved with a previous flowsheet row definition   Anxiety with depression  Sertraline  75 mg daily  Seeing a therapist for talk therapy   Stress level is improved  Son is back at work after health issues    ADHD methylphenidate  ER 20 mg daily    GERD Protonix  40 mg daily  (was on bid)   Trying hard to stick to decaf drinks  when he can   Lab Results  Component Value Date   VITAMINB12 358 05/26/2024   Orally supplementing B12  Hyperlipidemia Lab Results  Component Value Date   CHOL 189 05/26/2024   CHOL 182 05/26/2023   CHOL 165 05/23/2022   Lab Results  Component Value Date   HDL 62.60 05/26/2024   HDL 62.30 05/26/2023   HDL 54.90 05/23/2022   Lab Results  Component Value Date   LDLCALC 110 (H) 05/26/2024   LDLCALC 103 (H) 05/26/2023   LDLCALC 91 05/23/2022   Lab Results  Component Value Date   TRIG 80.0 05/26/2024   TRIG 81.0 05/26/2023   TRIG 92.0 05/23/2022   Lab Results  Component Value Date   CHOLHDL 3 05/26/2024   CHOLHDL 3 05/26/2023   CHOLHDL 3 05/23/2022   Lab Results  Component Value Date   LDLDIRECT 130.9 10/25/2008   LDLDIRECT 141.4 03/16/2007   LDLDIRECT 139.6  09/18/2006   Crestor  40 mg daily   The 10-year ASCVD risk score (Arnett DK, et al., 2019) is: 24.9%   Values used to calculate the score:     Age: 41 years     Clincally relevant sex: Male     Is Non-Hispanic African American: No     Diabetic: No     Tobacco smoker: No     Systolic Blood Pressure: 136 mmHg     Is BP treated: No     HDL Cholesterol: 62.6 mg/dL     Total Cholesterol: 189 mg/dL  Prediabetes Lab Results  Component Value Date   HGBA1C 6.4 05/26/2024   HGBA1C 6.1 05/26/2023   HGBA1C 6.0 01/02/2023   Is eating better at home  Eating less/ wife is on glp-1   Cut wine to 1.5 glasses max   Patient Active Problem List   Diagnosis Date Noted   Vitamin D  deficiency 05/28/2021   Diverticulosis 05/19/2020   Prediabetes 05/22/2017   B12 deficiency 04/22/2017   Current use of proton pump inhibitor 04/15/2016   Anxiety 08/20/2013   Colon cancer screening 03/31/2012   Routine general medical examination at a health care facility 11/08/2010   Prostate cancer screening 11/08/2010   GERD 09/20/2009   Attention deficit disorder 01/08/2007   Hyperlipidemia 01/07/2007   ERECTILE DYSFUNCTION 01/07/2007   Depression with anxiety 01/07/2007   Allergic rhinitis 01/07/2007   Past Medical History:  Diagnosis Date   ADD (attention deficit disorder)    Allergy    allergic rhinitis   Anxiety    Bursitis of hip, right    Depression    GERD (gastroesophageal reflux disease)    History of meniscal tear    left knee meniscal tear   Hyperlipidemia    Past Surgical History:  Procedure Laterality Date   COLONOSCOPY     Social History   Tobacco Use   Smoking status: Former    Current packs/day: 0.00    Types: Cigarettes    Quit date: 1978    Years since quitting: 47.8   Smokeless tobacco: Never   Tobacco comments:    Smoked for about one year 30 years ago  Vaping Use   Vaping status: Never Used  Substance Use Topics   Alcohol use: Yes    Alcohol/week: 0.0 standard  drinks of alcohol    Comment: occ   Drug use: Never   Family History  Problem Relation Age of Onset   Heart disease Father        CAD   Hypertension Mother  Depression Mother    Heart disease Paternal Uncle        CAD   COPD Maternal Grandfather    Colon cancer Neg Hx    Colon polyps Neg Hx    Rectal cancer Neg Hx    Stomach cancer Neg Hx    Esophageal cancer Neg Hx    Allergies  Allergen Reactions   Aleve [Naproxen Sodium] Swelling    Face and lips swollen and hives   Current Outpatient Medications on File Prior to Visit  Medication Sig Dispense Refill   Acetaminophen  (TYLENOL  PO) Take by mouth as needed.     Cholecalciferol (VITAMIN D3) 50 MCG (2000 UT) capsule Take 4,000 Units by mouth daily.     cyanocobalamin  (VITAMIN B12) 1000 MCG tablet Take 1,000 mcg by mouth daily.     methylphenidate  (METADATE  ER) 20 MG ER tablet Take 1 tablet (20 mg total) by mouth daily as needed. 30 tablet 0   sildenafil  (REVATIO ) 20 MG tablet TAKE 3 TABLETS BY MOUTH 30 MINUTES BEFORE SEXUAL ACTIVITY AS NEEDED 30 tablet 5   Thiamine HCl (VITAMIN B-1 PO) Take by mouth daily.     No current facility-administered medications on file prior to visit.    Review of Systems  Constitutional:  Positive for fatigue. Negative for activity change, appetite change, fever and unexpected weight change.  HENT:  Negative for congestion, rhinorrhea, sore throat and trouble swallowing.   Eyes:  Negative for pain, redness, itching and visual disturbance.  Respiratory:  Negative for cough, chest tightness, shortness of breath and wheezing.   Cardiovascular:  Negative for chest pain and palpitations.  Gastrointestinal:  Negative for abdominal pain, blood in stool, constipation, diarrhea and nausea.  Endocrine: Negative for cold intolerance, heat intolerance, polydipsia and polyuria.  Genitourinary:  Negative for difficulty urinating, dysuria, frequency and urgency.  Musculoskeletal:  Positive for arthralgias.  Negative for joint swelling and myalgias.  Skin:  Negative for pallor and rash.  Neurological:  Negative for dizziness, tremors, weakness, numbness and headaches.  Hematological:  Negative for adenopathy. Does not bruise/bleed easily.  Psychiatric/Behavioral:  Negative for decreased concentration and dysphoric mood. The patient is not nervous/anxious.        Mood is improved        Objective:   Physical Exam Constitutional:      General: He is not in acute distress.    Appearance: Normal appearance. He is well-developed and normal weight. He is not ill-appearing or diaphoretic.  HENT:     Head: Normocephalic and atraumatic.     Right Ear: Tympanic membrane, ear canal and external ear normal.     Left Ear: Tympanic membrane, ear canal and external ear normal.     Nose: Nose normal. No congestion.     Mouth/Throat:     Mouth: Mucous membranes are moist.     Pharynx: Oropharynx is clear. No posterior oropharyngeal erythema.  Eyes:     General: No scleral icterus.       Right eye: No discharge.        Left eye: No discharge.     Conjunctiva/sclera: Conjunctivae normal.     Pupils: Pupils are equal, round, and reactive to light.  Neck:     Thyroid : No thyromegaly.     Vascular: No carotid bruit or JVD.  Cardiovascular:     Rate and Rhythm: Normal rate and regular rhythm.     Pulses: Normal pulses.     Heart sounds: Normal heart sounds.  No gallop.  Pulmonary:     Effort: Pulmonary effort is normal. No respiratory distress.     Breath sounds: Normal breath sounds. No wheezing or rales.     Comments: Good air exch Chest:     Chest wall: No tenderness.  Abdominal:     General: Bowel sounds are normal. There is no distension or abdominal bruit.     Palpations: Abdomen is soft. There is no mass.     Tenderness: There is no abdominal tenderness.     Hernia: No hernia is present.  Musculoskeletal:        General: No tenderness.     Cervical back: Normal range of motion and  neck supple. No rigidity. No muscular tenderness.     Right lower leg: No edema.     Left lower leg: No edema.  Lymphadenopathy:     Cervical: No cervical adenopathy.  Skin:    General: Skin is warm and dry.     Coloration: Skin is not pale.     Findings: No erythema or rash.     Comments: Solar lentigines diffusely   Neurological:     Mental Status: He is alert.     Cranial Nerves: No cranial nerve deficit.     Motor: No abnormal muscle tone.     Coordination: Coordination normal.     Gait: Gait normal.     Deep Tendon Reflexes: Reflexes are normal and symmetric. Reflexes normal.  Psychiatric:        Mood and Affect: Mood normal.        Cognition and Memory: Cognition and memory normal.     Comments: Candidly discusses symptoms and stressors             Assessment & Plan:   Problem List Items Addressed This Visit       Digestive   GERD   Was able to cut protonix  to every day from bid  Watching diet       Relevant Medications   pantoprazole  (PROTONIX ) 40 MG tablet     Other   Vitamin D  deficiency   Last vitamin D  Lab Results  Component Value Date   VD25OH 30.10 05/26/2024    Encouraged pt to be compliant with vit D3 4000 international units every day For bone and overall health      Routine general medical examination at a health care facility - Primary   Reviewed health habits including diet and exercise and skin cancer prevention Reviewed appropriate screening tests for age  Also reviewed health mt list, fam hx and immunization status , as well as social and family history   See HPI Labs reviewed and ordered Health Maintenance  Topic Date Due   Zoster (Shingles) Vaccine (1 of 2) 08/10/1967   COVID-19 Vaccine (7 - 2025-26 season) 06/18/2024   Colon Cancer Screening  09/03/2025   DTaP/Tdap/Td vaccine (4 - Td or Tdap) 05/30/2032   Pneumococcal Vaccine for age over 39  Completed   Flu Shot  Completed   Hepatitis C Screening  Completed   Meningitis B  Vaccine  Aged Out    Psa is down  Plans to get shingrix vaccine  Discussed fall prevention, supplements and exercise for bone density  PHQ 0   in care with  med and therapy      Prostate cancer screening   Lab Results  Component Value Date   PSA 1.60 05/26/2024   PSA 2.32 12/02/2023   PSA 2.65 05/26/2023  No change in voiding habits         Prediabetes   Prediabetes  Lab Results  Component Value Date   HGBA1C 6.4 05/26/2024   HGBA1C 6.1 05/26/2023   HGBA1C 6.0 01/02/2023    disc imp of low glycemic diet and wt loss to prevent DM2        Hyperlipidemia   Disc goals for lipids and reasons to control them Rev last labs with pt Rev low sat fat diet in detail  LDL of 110 and HDL up into 60s Encouraged to continue crestor  40 mg daily       Relevant Medications   rosuvastatin  (CRESTOR ) 40 MG tablet   Depression with anxiety   Continues sertraline  75 mg daily  Has helped  Continues talk therapy as well  Encouraged good self care including exercise       Relevant Medications   sertraline  (ZOLOFT ) 50 MG tablet   Current use of proton pump inhibitor   Lab Results  Component Value Date   VITAMINB12 358 05/26/2024   Last vitamin D  Lab Results  Component Value Date   VD25OH 30.10 05/26/2024    Continue supplementation       Colon cancer screening   Colonoscopy 08/2022 with 3 y recall      B12 deficiency   Lab Results  Component Value Date   VITAMINB12 358 05/26/2024   Doing well with oral suppl On ppi      Attention deficit disorder   Continues methylphenidate  ER 20 mg on days he needs it

## 2024-06-02 NOTE — Assessment & Plan Note (Signed)
 Continues methylphenidate ER 20 mg on days he needs it

## 2024-06-02 NOTE — Assessment & Plan Note (Signed)
 Lab Results  Component Value Date   VITAMINB12 358 05/26/2024   Doing well with oral suppl On ppi

## 2024-06-02 NOTE — Assessment & Plan Note (Signed)
 Colonoscopy 08/2022 with 3 y recall

## 2024-06-02 NOTE — Assessment & Plan Note (Signed)
 Reviewed health habits including diet and exercise and skin cancer prevention Reviewed appropriate screening tests for age  Also reviewed health mt list, fam hx and immunization status , as well as social and family history   See HPI Labs reviewed and ordered Health Maintenance  Topic Date Due   Zoster (Shingles) Vaccine (1 of 2) 08/10/1967   COVID-19 Vaccine (7 - 2025-26 season) 06/18/2024   Colon Cancer Screening  09/03/2025   DTaP/Tdap/Td vaccine (4 - Td or Tdap) 05/30/2032   Pneumococcal Vaccine for age over 74  Completed   Flu Shot  Completed   Hepatitis C Screening  Completed   Meningitis B Vaccine  Aged Out    Psa is down  Plans to get shingrix vaccine  Discussed fall prevention, supplements and exercise for bone density  PHQ 0   in care with  med and therapy

## 2024-06-02 NOTE — Assessment & Plan Note (Signed)
 Was able to cut protonix to every day from bid  Watching diet

## 2024-06-02 NOTE — Assessment & Plan Note (Signed)
 Prediabetes  Lab Results  Component Value Date   HGBA1C 6.4 05/26/2024   HGBA1C 6.1 05/26/2023   HGBA1C 6.0 01/02/2023    disc imp of low glycemic diet and wt loss to prevent DM2

## 2024-06-02 NOTE — Assessment & Plan Note (Signed)
 Last vitamin D  Lab Results  Component Value Date   VD25OH 30.10 05/26/2024    Encouraged pt to be compliant with vit D3 4000 international units every day For bone and overall health

## 2024-06-02 NOTE — Assessment & Plan Note (Signed)
 Lab Results  Component Value Date   PSA 1.60 05/26/2024   PSA 2.32 12/02/2023   PSA 2.65 05/26/2023   No change in voiding habits

## 2024-06-02 NOTE — Assessment & Plan Note (Signed)
 Disc goals for lipids and reasons to control them Rev last labs with pt Rev low sat fat diet in detail  LDL of 110 and HDL up into 60s Encouraged to continue crestor  40 mg daily

## 2024-06-02 NOTE — Patient Instructions (Addendum)
 If you are interested in the new shingles vaccine (Shingrix) - call your local pharmacy to check on coverage and availability    Stay active Try the stationary bike Add some strength training to your routine, this is important for bone and brain health and can reduce your risk of falls and help your body use insulin properly and regulate weight  Light weights, exercise bands , and internet videos are a good way to start  Yoga (chair or regular), machines , floor exercises or a gym with machines are also good options   To prevent diabetes  Avoid added sugars in your diet when you can  Try to get most of your carbohydrates from produce (with the exception of white potatoes) and whole grains Eat less bread/pasta/rice/snack foods/cereals/sweets and other items from the middle of the grocery store (processed carbs)  Alcohol counts also

## 2024-06-02 NOTE — Assessment & Plan Note (Signed)
 Lab Results  Component Value Date   VITAMINB12 358 05/26/2024   Last vitamin D  Lab Results  Component Value Date   VD25OH 30.10 05/26/2024    Continue supplementation

## 2024-06-09 ENCOUNTER — Ambulatory Visit: Admitting: Clinical

## 2024-06-10 ENCOUNTER — Ambulatory Visit: Admitting: Clinical

## 2024-06-10 DIAGNOSIS — F4322 Adjustment disorder with anxiety: Secondary | ICD-10-CM | POA: Diagnosis not present

## 2024-06-10 NOTE — Progress Notes (Signed)
 Behavioral Health Treatment Plan   Name:Benjamin Bullock  MRN: [id number]   Treatment Plan Development Date: 06/10/24   Strengths and Supports:  Supportive Relationships and Able to Communicate Effectively Good personality - heart is in the right place. Analytical (strength & weakness)  Client Statement of Needs: Wants to learn to manage generalized anxiety symptoms   Treatment Level: Outpatient Individual Psycho therapy  Client Treatment Preferences:In-Person  Diagnosis: Adjustment disorder with anxiety   Symptoms:  Panic feeling in his body, re-checking to make sure he's done things correctly or remembers to do things, eg for work, or turning lights off, etc.  Goals:  Reduce anxiety and stress using cognitive and behavioral techniques to manage symptoms.  Objectives: Target Date For All Objectives: 05/06/2025  Interrupt negative thinking and increase positive thoughts and feelings  Progress Documentation:   Progressing  Interventions:  Cognitive Behavioral Therapy   Expected duration of treatment: 6-12 months  Party responsible for implementation of interventions: This Clinician, Rolin Pouch, LCSW.  This plan has been reviewed and created by the following participants: Client, Benjamin Bullock and this Clinician, J. Jalal Rauch, LCSW   A new plan will be created at least every 12 months.  The patient fully participated in the development of treatment plan with the clinician and verbally consents to such treatment.   Patient Treatment Plan Signature Obtained: No, pending signature page. Will need to print & sign at next appointment since unable to sign off at this appointment.   Benjamin Gasparyan SHAUNNA Pouch, LCSW

## 2024-06-10 NOTE — Progress Notes (Signed)
 [facility] Counselor/Therapist Progress Note - IN-PERSON  Patient ID: Benjamin Bullock, MRN: [id number]    Date: 06/10/24  Time Spent: 12pm   - 1pm  : 60 Minutes  Types of Service: Individual psychotherapy  Presenting Concerns:  Managing generalized anxiety symptoms  Mental Status Exam: Appearance:  Neat     Behavior: Appropriate  Motor: Normal  Speech/Language:  Normal Rate  Affect: Appropriate  Mood: anxious  Thought process: tangential  Thought content:   WNL  Sensory/Perceptual disturbances:   WNL  Orientation: oriented to person, place, time/date, situation, and day of week  Attention: Good  Concentration: Fair  Memory: WNL  Fund of knowledge:  Good  Insight:   Good and Fair  Judgment:  Good  Impulse Control: Good   Risk Assessment: Danger to Self:  No Self-injurious Behavior: No Danger to Others: No Duty to Warn:no   Subjective:  Benjamin Bullock reported ongoing symptoms of generalized anxiety that he wants to learn how to manage.   Interventions: Cognitive Behavioral Therapy - Psycho education on CBT triangle, identifying thoughts, emotions, actions & physical responses that he is experiencing.  Client Response: Benjamin Bullock reported that he's been able to manage his anxiety at work but continues to have difficulties in other situations or settings.  He is able to walk more, which is helping him.  Benjamin Bullock was open to learning about cognitive coping strategies, learning about unhelpful thinking habits, and trying to identify alternative thoughts or actions that can decrease his anxiety level.  Benjamin Bullock acknowledge that his automatic thoughts tend to focus on the worse things that could happen.  He has tried strategies to change his pattern of thinking and open to learning new ones.  Diagnosis:  Adjustment disorder with anxiety   Goals, Assessment & Plan:   Benjamin Bullock has learned to manage his anxiety level at work with support from his colleagues.  He  may benefit from ongoing psycho therapy to learn additional strategies to manage general anxiety symptoms.  Frequency: 1-3 times a month  Modality: In Person        Sharma Lawrance P. Trudy, MSW, LCSW [employer] Therapist Main Office: [phone]

## 2024-06-30 ENCOUNTER — Ambulatory Visit: Admitting: Clinical

## 2024-06-30 DIAGNOSIS — F4322 Adjustment disorder with anxiety: Secondary | ICD-10-CM

## 2024-06-30 NOTE — Progress Notes (Unsigned)
 [facility] Counselor/Therapist Progress Note - IN-PERSON  Patient ID: Benjamin Bullock, MRN: [id number]    Date: 06/30/24  Time Spent: 9:30am   - 10:30am  : 60 Minutes  Types of Service: Individual psychotherapy  Presenting Concerns:  - Increased stress with finances and work situation that occurred last week  Mental Status Exam: Appearance:  Neat     Behavior: Appropriate  Motor: Normal  Speech/Language:  Normal Rate  Affect: Appropriate  Mood: anxious  Thought process: normal  Thought content:   WNL  Sensory/Perceptual disturbances:   WNL  Orientation: oriented to person, place, time/date, situation, and day of week  Attention: Good  Concentration: Fair  Memory: WNL  Fund of knowledge:  Good  Insight:   Good  Judgment:  Good  Impulse Control: Good and Fair   Risk Assessment: Danger to Self:  No Self-injurious Behavior: No Danger to Others: No Duty to Warn:no   Subjective:  Benjamin Bullock discussed a recent incident at work that increased his anxiety level   Interventions: Cognitive Behavioral Therapy and Solution-Oriented/Positive Psychology  Client Response: Benjamin Bullock shared his thoughts and feelings about the situation at work last week that continued to bother him this week.  He also stated that this time of year is very stressful due to the expectations with birthday gifts for different family members and holiday gifts.  He shared the situation at work and solutions he identified to resolve the problem that increased his anxiety level.  He will work on focusing on the fact that it happened one time and if it happens again, there are people who are willing to help him, even if one of the assistants is not available.  Benjamin Bullock open to challenging irrational automatic thoughts to see if it's something he can control/change or if it's helpful/unhelpful.   Diagnosis:  Adjustment disorder with anxiety   Goals, Assessment & Plan:   Benjamin Bullock continues  to experience irrational thought patterns that sounds like catastrophizing, which is affecting his daily functioning.    Frequency: 1-2 times a month  Modality: In-person for outpatient individual therapy    Goal: Reduce anxiety and stress using cognitive and behavioral techniques to manage symptoms.   Objectives:    Interrupt negative thinking and increase positive thoughts and feelings  - Benjamin Bullock open to information about unhelpful thinking habits or cognitive distortions and strategies to challenge those distortions. He was also given written handouts to review.   Target Date: 05/06/25  Progress: Ongoing   Benjamin Bullock had a question about co-pays before scheduling another appointment. This Clinician will follow up with Administrative Team.  Josiane Labine P. Trudy, MSW, LCSW [employer] Therapist Main Office: [phone]

## 2024-07-01 ENCOUNTER — Ambulatory Visit: Admitting: Clinical

## 2024-07-06 ENCOUNTER — Telehealth: Payer: Self-pay | Admitting: *Deleted

## 2024-07-06 NOTE — Telephone Encounter (Signed)
 Thanks, will do  What diagnosis or symptom does he want me to add to referral? Anything besides cardio vascular risk factors?

## 2024-07-06 NOTE — Telephone Encounter (Signed)
 Copied from CRM #[id number]. Topic: Referral - Question >> Jul 05, 2024  4:26 PM Aisha D wrote: Reason for CRM: Pt is requesting a referral for for a cardiologist and would like for it to be sent to [facility] on [address], [place] [address]. Phone : [phone] [phone]. Pt would like a callback with an update.

## 2024-07-07 NOTE — Telephone Encounter (Signed)
 I put the referral in for cardiology Please let us  know if you don't hear in 1-2 weeks to set that up (mychart message or call or letter)

## 2024-07-07 NOTE — Telephone Encounter (Signed)
 Left VM requesting pt to call the office back

## 2024-07-07 NOTE — Telephone Encounter (Signed)
 Copied from CRM #[id number]. Topic: General - Other >> Jul 07, 2024  4:37 PM Wess RAMAN wrote: Reason for CRM: Patient stated he hasn't connected with a cardiologist in over 20 years and just wants to get checked out.  Callback #: [id number]

## 2024-07-11 ENCOUNTER — Other Ambulatory Visit: Payer: Self-pay | Admitting: Family Medicine

## 2024-07-29 ENCOUNTER — Ambulatory Visit: Admitting: Clinical

## 2024-07-29 DIAGNOSIS — F4322 Adjustment disorder with anxiety: Secondary | ICD-10-CM

## 2024-07-29 NOTE — Progress Notes (Unsigned)
 [facility] Counselor/Therapist Progress Note - IN-PERSON  Patient ID: Benjamin Bullock, MRN: [id number]    Date: 07/29/2024  Time Spent: 3:03pm   - 4pm  : 57 Minutes  Types of Service: Individual psychotherapy  Presenting Concerns:  - Stopped taking medicine for anxiety, the sertraline  due to side effects - Ongoing concerns with his wife's health and work that is increasing his anxiety  Mental Status Exam: Appearance:  Neat     Behavior: Appropriate  Motor: Normal  Speech/Language:  Normal Rate  Affect: Appropriate  Mood: anxious  Thought process: tangential  Thought content:   Rumination  Sensory/Perceptual disturbances:   WNL  Orientation: oriented to person, place, time/date, situation, and day of week  Attention: Good  Concentration: Fair  Memory: WNL  Fund of knowledge:  Good  Insight:   Good and Fair  Judgment:  Good  Impulse Control: Good   Risk Assessment: Danger to Self:  No Self-injurious Behavior: No Danger to Others: No Duty to Warn:no   Subjective:  Mr. Tremont reported concerns with side effects from the increase in sertraline .  He reported concerns about his wife, stressors at work and finances.   Interventions: Cognitive Behavioral Therapy and Medication Monitoring - Ongoing psycho education on the connection between thoughts, emotions & behaviors. Health education on medications - he signed consent to exchange information with PCP since PCP was not aware he stopped taking sertraline .  Client Response: Medication Monitoring: Stopped taking sertraline  a week ago due to constipation. Wants to go back to 50mg  and doesn't want 75 mg Methylphenidate  ER- he reported it helps him focus but feels jittery when taking it  Mr. Gaumer reported he was open to taking medicine for anxiety since his wife has told him he is anxious but does not want to take the 75 mg.   Mr. Bucklin stated he continues to have obsessive thoughts throughout the day and  increased concern about his wife's condition.  He shared additional information about her past and current behaviors that may be contributing to his anxiety.  Mr. Swamy was open to the information on the worksheet with challenging his automatic thoughts and decatastrophizing.  Diagnosis:  Adjustment disorder with anxiety   Goals, Assessment & Plan:      Frequency: 1-2 times a month  Modality: In-person for outpatient individual therapy      Goal: Reduce anxiety and stress using cognitive and behavioral techniques to manage symptoms.   Objectives:    Interrupt negative thinking and increase positive thoughts and feelings  - Mr. Mongeau open challenge unhelpful/negative thoughts that increase his anxiety.  He was open to reviewing the information on the worksheets given to him about how to challenge his automatic thoughts and decatastrophizing.     Target Date: 05/06/25  Progress: Ongoing     Simeon Vera P. Trudy, MSW, LCSW [employer] Therapist Main Office: [phone]

## 2024-07-30 ENCOUNTER — Telehealth: Payer: Self-pay | Admitting: Family Medicine

## 2024-07-30 NOTE — Telephone Encounter (Signed)
 Received message from counselor as follows    Update & Med monitoring Due: In 3 days Received: Today Trudy Rolin SQUIBB, LCSW  Cathrine Krizan, Laine LABOR, MD Good afternoon Dr. Randeen, I am working with Benjamin Bullock to address anxiety symptoms.  I saw him yesterday for the fourth time.  He signed a consent to exchange information and he wanted me to let you know that he stopped taking the sertraline  about a week ago.  He thought that the increase to 75 mg made him more constipated, which was negatively affecting him to the point he just stopped taking the sertraline .  He reported no other side effects from the medicine besides the constipation and he thought it was helpful at 50 mg.  He did not report any negative effects after stopping sertraline  completely.  I think he would still benefit from taking medication for anxiety since he is no longer running, which was an effective coping strategy for him.  He is walking so that has been helpful.  Can you please have either you or your team contact him directly to discuss the sertraline ? I also think he benefits from taking the methylphenidate  but he usually takes it as needed and he says he takes half the dose but it's extended release so I'm concerned that he's cutting it and the medicine is not working the way it needs to.    If you have any other questions or concerns, you can call me directly [phone]. I'll be out of the office this afternoon but I can call you back as soon as I can.  Thank you for your time.  Jasmine  Jasmine P. Trudy, MSW, LCSW [facility] Therapist Main Office: [phone]   Please contact pt  Let him know I feel strongly that he should continue sertraline  at 50 mg daily (instead of 75) Start back on it and change on med list Send in if needed    Also please do not cut your methylphenidate -it is ext release and that will not work   Thanks  Keep me posted

## 2024-07-30 NOTE — Telephone Encounter (Signed)
 Left VM requesting pt to call the office back

## 2024-08-02 MED ORDER — METHYLPHENIDATE HCL ER 20 MG PO TBCR: 20.0000 mg | EXTENDED_RELEASE_TABLET | Freq: Every day | ORAL | 0 refills | Status: AC | PRN

## 2024-08-02 MED ORDER — SERTRALINE HCL 50 MG PO TABS: 50.0000 mg | ORAL_TABLET | Freq: Every day | ORAL | 2 refills | Status: AC

## 2024-08-02 NOTE — Telephone Encounter (Signed)
 Pt notified of Dr. Graham comments he wasn't aware he shouldn't cut metadate  ER in half, I explained how the extend release caps works so he will not cut them in half. He does need a refill  Last filled on 09/09/23 #30 tab/ 0 refill  Also he would like a new Rx for the Zoloft  with the new directions of taking 50 mg (1 tab daily).  [facility] [address] [address] for both meds   Next CPE scheduled 06/03/25

## 2024-08-11 ENCOUNTER — Ambulatory Visit (HOSPITAL_COMMUNITY)
Admission: RE | Admit: 2024-08-11 | Discharge: 2024-08-11 | Disposition: A | Discharge status: Home / Self Care | Source: Ambulatory Visit | Attending: Cardiology | Admitting: Cardiology

## 2024-08-11 ENCOUNTER — Encounter (HOSPITAL_COMMUNITY): Payer: Self-pay | Admitting: Cardiology

## 2024-08-11 VITALS — BP 110/70 | HR 64 | Wt 164.6 lb

## 2024-08-11 DIAGNOSIS — I491 Atrial premature depolarization: Secondary | ICD-10-CM | POA: Insufficient documentation

## 2024-08-11 DIAGNOSIS — Z79899 Other long term (current) drug therapy: Secondary | ICD-10-CM | POA: Insufficient documentation

## 2024-08-11 DIAGNOSIS — I4510 Unspecified right bundle-branch block: Secondary | ICD-10-CM | POA: Insufficient documentation

## 2024-08-11 DIAGNOSIS — E785 Hyperlipidemia, unspecified: Secondary | ICD-10-CM

## 2024-08-11 DIAGNOSIS — Z8249 Family history of ischemic heart disease and other diseases of the circulatory system: Secondary | ICD-10-CM | POA: Insufficient documentation

## 2024-08-11 DIAGNOSIS — I440 Atrioventricular block, first degree: Secondary | ICD-10-CM | POA: Diagnosis not present

## 2024-08-11 NOTE — Patient Instructions (Signed)
 Medication Changes:  None  Lab Work:  Labs done today, your results will be available in MyChart, we will contact you for abnormal readings.  Testing/Procedures:  CT Coronary Calcium  Score:  Your physician has recommended that you have CT Coronary Calcium  Score.  - $99 out of pocket cost at the time of your test You will be called to schedule this   Follow-Up in: 1 year (Jan 2027), **PLEASE CALL OUR OFFICE IN NOVEMBER TO SCHEDULE THIS APPOINTMENT   At [facility], you and your health needs are our priority. We have a designated team specialized in the treatment of Heart Failure. This Care Team includes your primary Heart Failure Specialized Cardiologist (physician), Advanced Practice Providers (APPs- Physician Assistants and Nurse Practitioners), and Pharmacist who all work together to provide you with the care you need, when you need it.   You may see any of the following providers on your designated Care Team at your next follow up:  Dr. Toribio Fuel Dr. Ezra Shuck Dr. Odis Brownie Greig Mosses, NP Caffie Shed, [place] [facility], [place] Beckey Coe, NP Jordan Lee, NP Tinnie Redman, PharmD   Please be sure to bring in all your medications bottles to every appointment.   Need to Contact Us :  If you have any questions or concerns before your next appointment please send us  a message through [facility] or call our office at [phone].    TO LEAVE A MESSAGE FOR THE NURSE SELECT OPTION 2, PLEASE LEAVE A MESSAGE INCLUDING: YOUR NAME DATE OF BIRTH CALL BACK NUMBER REASON FOR CALL**this is important as we prioritize the call backs  YOU WILL RECEIVE A CALL BACK THE SAME DAY AS LONG AS YOU CALL BEFORE 4:00 PM

## 2024-08-12 NOTE — Progress Notes (Signed)
 PCP: Randeen Laine LABOR, MD Cardiology: Dr. Rolan  Chief complaint: Cardiac risk evaluation  74 y.o. with history of hyperlipidemia was self-referred for evaluation of cardiac risk.  Patient has a history of hyperlipidemia and has been on Crestor  40 mg daily x years. He has never had a cardiac event.  He is not hypertensive or diabetic.  He is concerned about his risk because his father, a nonsmoker, had a large MI at age 70.    Patient exercises regularly, walks several miles 3-5 days/week.  No exertional dyspnea or chest pain.  Can walk up a hill without problems.  No palpitations or lightheadedness.  No unusual fatigue.  Main limitation is hip pain from arthritis.    ECG (personally reviewed): NSR, 1st degree AVB, PACs, RBBB  Labs (11/25): LDL 110  PMH: 1. Hyperlipidemia 2. Depression 3. B12 deficiency 4. GERD  FH: Father with MI at 24  SH: Nonsmoker, occasional ETOH, married, retired.  Has 1 son in town.   ROS: All systems reviewed and negative except as per HPI.   Current Outpatient Medications  Medication Sig Dispense Refill   Acetaminophen  (TYLENOL  PO) Take by mouth as needed.     Cholecalciferol (VITAMIN D3) 50 MCG (2000 UT) capsule Take 4,000 Units by mouth daily.     cyanocobalamin  (VITAMIN B12) 1000 MCG tablet Take 1,000 mcg by mouth daily.     methylphenidate  (METADATE  ER) 20 MG ER tablet Take 1 tablet (20 mg total) by mouth daily as needed. 30 tablet 0   pantoprazole  (PROTONIX ) 40 MG tablet Take 1 tablet (40 mg total) by mouth daily. 90 tablet 3   rosuvastatin  (CRESTOR ) 40 MG tablet Take 1 tablet (40 mg total) by mouth daily. 90 tablet 3   sertraline  (ZOLOFT ) 50 MG tablet Take 1 tablet (50 mg total) by mouth daily. 90 tablet 2   sildenafil  (REVATIO ) 20 MG tablet TAKE 3 TABLETS BY MOUTH 30 MINUTES BEFORE SEXUAL ACTIVITY AS NEEDED 30 tablet 5   Thiamine HCl (VITAMIN B-1 PO) Take by mouth daily.     No current facility-administered medications for this encounter.   BP  110/70   Pulse 64   Wt 74.7 kg (164 lb 9.6 oz)   SpO2 95%   BMI 26.57 kg/m  General: NAD Neck: No JVD, no thyromegaly or thyroid  nodule.  Lungs: Clear to auscultation bilaterally with normal respiratory effort. CV: Nondisplaced PMI.  Heart regular S1/S2, no S3/S4, no murmur.  No peripheral edema.  No carotid bruit.  Normal pedal pulses.  Abdomen: Soft, nontender, no hepatosplenomegaly, no distention.  Skin: Intact without lesions or rashes.  Neurologic: Alert and oriented x 3.  Psych: Normal affect. Extremities: No clubbing or cyanosis.  HEENT: Normal.   Assessment/Plan: 1. Cardiac risk: Patient is asymptomatic.  His main risk factor is hyperlipidemia.  His father had a large MI at age 78.  He is a nonsmoker.  Interestingly, he is on a high dose of Crestor  and LDL is actually not that low (110 in 11/25).  This suggests the possibility of a familial hyperlipidemia.  - I will arrange for coronary artery calcium  score scan. If this is intermediate to high risk, will aim for LDL < 55 and add PCSK9 inhibitor.  - Continue Crestor .  2. Hyperlipidemia: He is on Crestor  40 mg daily with LDL 110 in 11/25.  This is higher than I would expect on high dose Crestor .  As above, concern for a familial hyperlipidemia.  - Check Lp(a) today.  -  As above, if calcium  score is intermediate to high risk, will aim for LDL < 55.  3. Neuro: I noted that the patient seemed to have some trouble at times with word-finding.  His family has noticed this as well, they have been concerned with the apparent change in his fluency that he may have had a stroke at some point.  This is not acute, has been present for a number of weeks.  - I will make a referral to neurology to evaluation.   Followup 1 year.   I spent 56 minutes reviewing records, interviewing/examining patient, and managing orders.   Ezra Shuck 08/12/2024

## 2024-08-13 ENCOUNTER — Other Ambulatory Visit (HOSPITAL_COMMUNITY): Payer: Self-pay

## 2024-08-13 DIAGNOSIS — I639 Cerebral infarction, unspecified: Secondary | ICD-10-CM

## 2024-08-13 NOTE — Addendum Note (Signed)
 Encounter addended by: Rolan Ezra RAMAN, MD on: 08/13/2024 1:26 PM  Actions taken: Clinical Note Signed

## 2024-08-17 ENCOUNTER — Ambulatory Visit: Admitting: Clinical

## 2024-08-19 ENCOUNTER — Encounter: Payer: Self-pay | Admitting: Physician Assistant

## 2024-08-19 ENCOUNTER — Ambulatory Visit: Admitting: Clinical

## 2024-08-23 ENCOUNTER — Telehealth: Payer: Self-pay | Admitting: Clinical

## 2024-08-23 NOTE — Telephone Encounter (Signed)
 This Clinician received a message that Benjamin Bullock wanted to reschedule appointment. This Clinician left a message to call back with name & contact information and offered an appointment time for this Friday 08/27/24 at 11:30am.

## 2024-08-25 ENCOUNTER — Ambulatory Visit: Admitting: Clinical

## 2024-08-26 ENCOUNTER — Telehealth: Payer: Self-pay | Admitting: Clinical

## 2024-08-26 NOTE — Telephone Encounter (Signed)
 TC to [name] to follow up about scheduling an appointment. No answer. This Clinician left a message to call back with name & contact information.  This Clinician left possible dates & times that are available.

## 2024-09-13 ENCOUNTER — Ambulatory Visit: Payer: Self-pay | Admitting: Physician Assistant

## 2025-05-27 ENCOUNTER — Other Ambulatory Visit

## 2025-06-03 ENCOUNTER — Encounter: Admitting: Family Medicine
